# Patient Record
Sex: Female | Born: 1965
Health system: Southern US, Community
[De-identification: ages and names within clinical notes are randomized; demographics above are authoritative.]

## PROBLEM LIST (undated history)

## (undated) DIAGNOSIS — Z973 Presence of spectacles and contact lenses: Secondary | ICD-10-CM

## (undated) DIAGNOSIS — IMO0002 Reserved for concepts with insufficient information to code with codable children: Secondary | ICD-10-CM

## (undated) DIAGNOSIS — Z96659 Presence of unspecified artificial knee joint: Secondary | ICD-10-CM

## (undated) DIAGNOSIS — N814 Uterovaginal prolapse, unspecified: Secondary | ICD-10-CM

## (undated) DIAGNOSIS — S73004A Unspecified dislocation of right hip, initial encounter: Secondary | ICD-10-CM

## (undated) DIAGNOSIS — Q059 Spina bifida, unspecified: Secondary | ICD-10-CM

## (undated) HISTORY — DX: Uterovaginal prolapse, unspecified: N81.4

## (undated) HISTORY — DX: Spina bifida, unspecified: Q05.9

## (undated) HISTORY — DX: Reserved for concepts with insufficient information to code with codable children: IMO0002

## (undated) HISTORY — DX: Unspecified dislocation of right hip, initial encounter: S73.004A

## (undated) HISTORY — DX: Presence of spectacles and contact lenses: Z97.3

---
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
## 2003-07-12 DIAGNOSIS — Z96659 Presence of unspecified artificial knee joint: Secondary | ICD-10-CM

## 2003-07-12 HISTORY — DX: Presence of unspecified artificial knee joint: Z96.659

## 2011-07-12 DIAGNOSIS — IMO0002 Reserved for concepts with insufficient information to code with codable children: Secondary | ICD-10-CM

## 2011-07-12 DIAGNOSIS — R87619 Unspecified abnormal cytological findings in specimens from cervix uteri: Secondary | ICD-10-CM

## 2011-07-12 HISTORY — DX: Reserved for concepts with insufficient information to code with codable children: IMO0002

## 2011-07-12 HISTORY — DX: Unspecified abnormal cytological findings in specimens from cervix uteri: R87.619

## 2011-07-19 DIAGNOSIS — L02619 Cutaneous abscess of unspecified foot: Secondary | ICD-10-CM | POA: Diagnosis not present

## 2011-07-19 DIAGNOSIS — L97409 Non-pressure chronic ulcer of unspecified heel and midfoot with unspecified severity: Secondary | ICD-10-CM | POA: Diagnosis not present

## 2011-07-19 DIAGNOSIS — G609 Hereditary and idiopathic neuropathy, unspecified: Secondary | ICD-10-CM | POA: Diagnosis not present

## 2011-07-25 DIAGNOSIS — Z23 Encounter for immunization: Secondary | ICD-10-CM | POA: Diagnosis not present

## 2011-08-08 DIAGNOSIS — N3946 Mixed incontinence: Secondary | ICD-10-CM | POA: Diagnosis not present

## 2011-08-08 DIAGNOSIS — Q054 Unspecified spina bifida with hydrocephalus: Secondary | ICD-10-CM | POA: Diagnosis not present

## 2011-08-09 DIAGNOSIS — L97409 Non-pressure chronic ulcer of unspecified heel and midfoot with unspecified severity: Secondary | ICD-10-CM | POA: Diagnosis not present

## 2011-08-09 DIAGNOSIS — L03119 Cellulitis of unspecified part of limb: Secondary | ICD-10-CM | POA: Diagnosis not present

## 2011-08-09 DIAGNOSIS — L02619 Cutaneous abscess of unspecified foot: Secondary | ICD-10-CM | POA: Diagnosis not present

## 2011-08-09 DIAGNOSIS — G609 Hereditary and idiopathic neuropathy, unspecified: Secondary | ICD-10-CM | POA: Diagnosis not present

## 2011-08-19 DIAGNOSIS — M25569 Pain in unspecified knee: Secondary | ICD-10-CM | POA: Diagnosis not present

## 2011-08-25 DIAGNOSIS — H4920 Sixth [abducent] nerve palsy, unspecified eye: Secondary | ICD-10-CM | POA: Diagnosis not present

## 2011-08-25 DIAGNOSIS — H53459 Other localized visual field defect, unspecified eye: Secondary | ICD-10-CM | POA: Diagnosis not present

## 2011-08-25 DIAGNOSIS — H5210 Myopia, unspecified eye: Secondary | ICD-10-CM | POA: Diagnosis not present

## 2011-09-01 DIAGNOSIS — L97409 Non-pressure chronic ulcer of unspecified heel and midfoot with unspecified severity: Secondary | ICD-10-CM | POA: Diagnosis not present

## 2011-09-01 DIAGNOSIS — L02619 Cutaneous abscess of unspecified foot: Secondary | ICD-10-CM | POA: Diagnosis not present

## 2011-09-01 DIAGNOSIS — L03119 Cellulitis of unspecified part of limb: Secondary | ICD-10-CM | POA: Diagnosis not present

## 2011-09-01 DIAGNOSIS — G609 Hereditary and idiopathic neuropathy, unspecified: Secondary | ICD-10-CM | POA: Diagnosis not present

## 2011-09-27 DIAGNOSIS — L03119 Cellulitis of unspecified part of limb: Secondary | ICD-10-CM | POA: Diagnosis not present

## 2011-09-27 DIAGNOSIS — L02619 Cutaneous abscess of unspecified foot: Secondary | ICD-10-CM | POA: Diagnosis not present

## 2011-09-27 DIAGNOSIS — L97409 Non-pressure chronic ulcer of unspecified heel and midfoot with unspecified severity: Secondary | ICD-10-CM | POA: Diagnosis not present

## 2011-09-27 DIAGNOSIS — G609 Hereditary and idiopathic neuropathy, unspecified: Secondary | ICD-10-CM | POA: Diagnosis not present

## 2011-10-24 DIAGNOSIS — Q5270 Unspecified congenital malformations of vulva: Secondary | ICD-10-CM | POA: Diagnosis not present

## 2011-10-24 DIAGNOSIS — Q519 Congenital malformation of uterus and cervix, unspecified: Secondary | ICD-10-CM | POA: Diagnosis not present

## 2011-10-24 DIAGNOSIS — Z0000 Encounter for general adult medical examination without abnormal findings: Secondary | ICD-10-CM | POA: Diagnosis not present

## 2011-10-24 DIAGNOSIS — Q054 Unspecified spina bifida with hydrocephalus: Secondary | ICD-10-CM | POA: Diagnosis not present

## 2011-10-24 DIAGNOSIS — I721 Aneurysm of artery of upper extremity: Secondary | ICD-10-CM | POA: Diagnosis not present

## 2011-10-24 DIAGNOSIS — Z124 Encounter for screening for malignant neoplasm of cervix: Secondary | ICD-10-CM | POA: Diagnosis not present

## 2011-10-24 DIAGNOSIS — Z01419 Encounter for gynecological examination (general) (routine) without abnormal findings: Secondary | ICD-10-CM | POA: Diagnosis not present

## 2011-10-24 DIAGNOSIS — N3946 Mixed incontinence: Secondary | ICD-10-CM | POA: Diagnosis not present

## 2011-10-25 DIAGNOSIS — L97409 Non-pressure chronic ulcer of unspecified heel and midfoot with unspecified severity: Secondary | ICD-10-CM | POA: Diagnosis not present

## 2011-10-25 DIAGNOSIS — G609 Hereditary and idiopathic neuropathy, unspecified: Secondary | ICD-10-CM | POA: Diagnosis not present

## 2011-10-25 DIAGNOSIS — L03119 Cellulitis of unspecified part of limb: Secondary | ICD-10-CM | POA: Diagnosis not present

## 2011-10-25 DIAGNOSIS — L02619 Cutaneous abscess of unspecified foot: Secondary | ICD-10-CM | POA: Diagnosis not present

## 2011-11-07 ENCOUNTER — Other Ambulatory Visit: Payer: Self-pay | Admitting: Nurse Practitioner

## 2011-11-07 DIAGNOSIS — Q899 Congenital malformation, unspecified: Secondary | ICD-10-CM

## 2011-11-07 DIAGNOSIS — I721 Aneurysm of artery of upper extremity: Secondary | ICD-10-CM

## 2011-11-17 DIAGNOSIS — G911 Obstructive hydrocephalus: Secondary | ICD-10-CM | POA: Diagnosis not present

## 2011-11-17 DIAGNOSIS — G96198 Other disorders of meninges, not elsewhere classified: Secondary | ICD-10-CM | POA: Diagnosis not present

## 2011-11-17 DIAGNOSIS — R269 Unspecified abnormalities of gait and mobility: Secondary | ICD-10-CM | POA: Diagnosis not present

## 2011-11-17 DIAGNOSIS — IMO0002 Reserved for concepts with insufficient information to code with codable children: Secondary | ICD-10-CM | POA: Diagnosis not present

## 2011-11-24 ENCOUNTER — Ambulatory Visit
Admission: RE | Admit: 2011-11-24 | Discharge: 2011-11-24 | Disposition: A | Discharge status: Home / Self Care | Payer: Medicare Other | Source: Ambulatory Visit | Attending: Nurse Practitioner | Admitting: Nurse Practitioner

## 2011-11-24 ENCOUNTER — Other Ambulatory Visit: Payer: Self-pay | Admitting: Nurse Practitioner

## 2011-11-24 DIAGNOSIS — Q899 Congenital malformation, unspecified: Secondary | ICD-10-CM

## 2011-11-24 DIAGNOSIS — R9389 Abnormal findings on diagnostic imaging of other specified body structures: Secondary | ICD-10-CM | POA: Diagnosis not present

## 2011-11-24 DIAGNOSIS — Q5270 Unspecified congenital malformations of vulva: Secondary | ICD-10-CM | POA: Diagnosis not present

## 2011-11-24 DIAGNOSIS — D259 Leiomyoma of uterus, unspecified: Secondary | ICD-10-CM | POA: Diagnosis not present

## 2011-11-24 DIAGNOSIS — K824 Cholesterolosis of gallbladder: Secondary | ICD-10-CM | POA: Diagnosis not present

## 2011-11-24 DIAGNOSIS — N852 Hypertrophy of uterus: Secondary | ICD-10-CM | POA: Diagnosis not present

## 2011-11-24 DIAGNOSIS — Q524 Other congenital malformations of vagina: Secondary | ICD-10-CM | POA: Diagnosis not present

## 2011-11-24 DIAGNOSIS — I721 Aneurysm of artery of upper extremity: Secondary | ICD-10-CM

## 2011-11-24 DIAGNOSIS — R109 Unspecified abdominal pain: Secondary | ICD-10-CM | POA: Diagnosis not present

## 2011-11-24 DIAGNOSIS — Q519 Congenital malformation of uterus and cervix, unspecified: Secondary | ICD-10-CM | POA: Diagnosis not present

## 2011-11-25 ENCOUNTER — Encounter (HOSPITAL_BASED_OUTPATIENT_CLINIC_OR_DEPARTMENT_OTHER): Payer: Medicare Other | Attending: General Surgery

## 2011-11-25 DIAGNOSIS — L89899 Pressure ulcer of other site, unspecified stage: Secondary | ICD-10-CM | POA: Diagnosis not present

## 2011-11-25 DIAGNOSIS — L8992 Pressure ulcer of unspecified site, stage 2: Secondary | ICD-10-CM | POA: Insufficient documentation

## 2011-12-01 DIAGNOSIS — Z1231 Encounter for screening mammogram for malignant neoplasm of breast: Secondary | ICD-10-CM | POA: Diagnosis not present

## 2011-12-02 DIAGNOSIS — L89899 Pressure ulcer of other site, unspecified stage: Secondary | ICD-10-CM | POA: Diagnosis not present

## 2011-12-02 DIAGNOSIS — L8992 Pressure ulcer of unspecified site, stage 2: Secondary | ICD-10-CM | POA: Diagnosis not present

## 2011-12-09 DIAGNOSIS — L89899 Pressure ulcer of other site, unspecified stage: Secondary | ICD-10-CM | POA: Diagnosis not present

## 2011-12-09 DIAGNOSIS — L8992 Pressure ulcer of unspecified site, stage 2: Secondary | ICD-10-CM | POA: Diagnosis not present

## 2011-12-16 ENCOUNTER — Encounter (HOSPITAL_BASED_OUTPATIENT_CLINIC_OR_DEPARTMENT_OTHER): Payer: Medicare Other | Attending: General Surgery

## 2011-12-16 DIAGNOSIS — L89899 Pressure ulcer of other site, unspecified stage: Secondary | ICD-10-CM | POA: Diagnosis not present

## 2011-12-16 DIAGNOSIS — L84 Corns and callosities: Secondary | ICD-10-CM | POA: Diagnosis not present

## 2011-12-16 DIAGNOSIS — L8990 Pressure ulcer of unspecified site, unspecified stage: Secondary | ICD-10-CM | POA: Insufficient documentation

## 2011-12-16 DIAGNOSIS — G8220 Paraplegia, unspecified: Secondary | ICD-10-CM | POA: Diagnosis not present

## 2011-12-16 DIAGNOSIS — Z79899 Other long term (current) drug therapy: Secondary | ICD-10-CM | POA: Diagnosis not present

## 2011-12-16 DIAGNOSIS — Q059 Spina bifida, unspecified: Secondary | ICD-10-CM | POA: Insufficient documentation

## 2011-12-23 DIAGNOSIS — L84 Corns and callosities: Secondary | ICD-10-CM | POA: Diagnosis not present

## 2011-12-23 DIAGNOSIS — Q059 Spina bifida, unspecified: Secondary | ICD-10-CM | POA: Diagnosis not present

## 2011-12-23 DIAGNOSIS — L8990 Pressure ulcer of unspecified site, unspecified stage: Secondary | ICD-10-CM | POA: Diagnosis not present

## 2011-12-23 DIAGNOSIS — L89899 Pressure ulcer of other site, unspecified stage: Secondary | ICD-10-CM | POA: Diagnosis not present

## 2011-12-30 ENCOUNTER — Encounter (HOSPITAL_BASED_OUTPATIENT_CLINIC_OR_DEPARTMENT_OTHER): Payer: Medicare Other

## 2011-12-30 DIAGNOSIS — L89899 Pressure ulcer of other site, unspecified stage: Secondary | ICD-10-CM | POA: Diagnosis not present

## 2011-12-30 DIAGNOSIS — N319 Neuromuscular dysfunction of bladder, unspecified: Secondary | ICD-10-CM | POA: Diagnosis not present

## 2011-12-30 DIAGNOSIS — L84 Corns and callosities: Secondary | ICD-10-CM | POA: Diagnosis not present

## 2011-12-30 DIAGNOSIS — N3020 Other chronic cystitis without hematuria: Secondary | ICD-10-CM | POA: Diagnosis not present

## 2011-12-30 DIAGNOSIS — Q059 Spina bifida, unspecified: Secondary | ICD-10-CM | POA: Diagnosis not present

## 2011-12-30 DIAGNOSIS — N3946 Mixed incontinence: Secondary | ICD-10-CM | POA: Diagnosis not present

## 2011-12-30 DIAGNOSIS — L8990 Pressure ulcer of unspecified site, unspecified stage: Secondary | ICD-10-CM | POA: Diagnosis not present

## 2012-01-13 DIAGNOSIS — N3946 Mixed incontinence: Secondary | ICD-10-CM | POA: Diagnosis not present

## 2012-01-13 DIAGNOSIS — N3020 Other chronic cystitis without hematuria: Secondary | ICD-10-CM | POA: Diagnosis not present

## 2012-01-26 ENCOUNTER — Encounter: Payer: Self-pay | Admitting: Obstetrics and Gynecology

## 2012-02-13 DIAGNOSIS — N3020 Other chronic cystitis without hematuria: Secondary | ICD-10-CM | POA: Diagnosis not present

## 2012-02-13 DIAGNOSIS — N3941 Urge incontinence: Secondary | ICD-10-CM | POA: Diagnosis not present

## 2012-02-14 ENCOUNTER — Encounter: Payer: Self-pay | Admitting: Obstetrics and Gynecology

## 2012-02-14 ENCOUNTER — Ambulatory Visit (INDEPENDENT_AMBULATORY_CARE_PROVIDER_SITE_OTHER): Payer: Medicare Other | Admitting: Obstetrics and Gynecology

## 2012-02-14 VITALS — BP 100/60 | Ht <= 58 in | Wt 94.0 lb

## 2012-02-14 DIAGNOSIS — D219 Benign neoplasm of connective and other soft tissue, unspecified: Secondary | ICD-10-CM | POA: Insufficient documentation

## 2012-02-14 DIAGNOSIS — D259 Leiomyoma of uterus, unspecified: Secondary | ICD-10-CM

## 2012-02-14 DIAGNOSIS — Z719 Counseling, unspecified: Secondary | ICD-10-CM

## 2012-02-14 NOTE — Progress Notes (Signed)
Uterus: The uterus is enlarged measuring 15.7 cm sagittally with a  depth of 2.3 cm and width of 13.1 cm. There do appear to be at  least two uterine fibroids present. One fibroid measures 11.1 x  7.1 x 8.9 cm, with the second fibroid measuring 5.2 x 4.8 x 3.9 cm.  The presence of these large fibroids makes assessment of the  endometrium and adnexa difficult.  Endometrium: By the best measurement, the endometrium measures 15.8  mm in thickness although assessment is limited due to the large  fibroids. A small amount of fluid is noted in the lower uterine  segment.  Right ovary: The right ovary is not optimally seen but no mass is  evident.  Left ovary: The left ovary is normal in size with a small cyst of  1.6 cm present.  Other Findings: No free fluid is seen.  IMPRESSION:  1. Enlarged uterus with at least two measured fibroids as noted  above. These fibroids make assessment of the endometrium and  adnexa difficult.  2. No ovarian lesion is seen although the right ovary is not  optimally visualized.  Pt reports a nl pap within the last yr with PCP Pt reports constipation and difficulty with BMs-she has to push down a lot Pt reports hernia  No new complaints.  No menses except spotting intermittently over the past yr.  Filed Vitals:   02/14/12 1427  BP: 100/60   ROS: noncontributory  Pelvic exam:  VULVA: normal appearing vulva with no masses, tenderness or lesions,  VAGINA: normal appearing vagina with normal color and discharge, no lesions, prolapse to introitus of cervix CERVIX: normal appearing cervix without discharge or lesions,  UTERUS: uterus is18wk size, shape, consistency and nontender,  ADNEXA: normal adnexa in size, nontender and no masses.  A/P H/o spina bifida with bowel and urinary problems all her life which she and her mother say are no worse. Pt does not have cycles and is perimenopausal Uterine prolapse is significant but pt denies increased pelvic  pressure or problems because of it I offered pt TAH secondary to size but also discussed obs considering the lack of symptoms Pt and mom decide to obs for now RTO for AEX

## 2012-03-07 ENCOUNTER — Ambulatory Visit (INDEPENDENT_AMBULATORY_CARE_PROVIDER_SITE_OTHER): Payer: Medicare Other | Admitting: General Surgery

## 2012-03-07 ENCOUNTER — Encounter (INDEPENDENT_AMBULATORY_CARE_PROVIDER_SITE_OTHER): Payer: Self-pay | Admitting: General Surgery

## 2012-03-07 VITALS — BP 108/72 | HR 68 | Temp 99.0°F | Resp 18 | Ht <= 58 in | Wt 94.0 lb

## 2012-03-07 DIAGNOSIS — K439 Ventral hernia without obstruction or gangrene: Secondary | ICD-10-CM | POA: Diagnosis not present

## 2012-03-07 NOTE — Progress Notes (Signed)
Patient ID: Mary Neal, female   DOB: Jan 01, 1966, 46 y.o.   MRN: [id number]  No chief complaint on file.   HPI Mary Neal is a 46 y.o. female.  This patient is referred by Dr. Su Hilt for evaluation of a ventral hernia. She says that she has a history of an umbilical hernia repair performed almost 40 years ago as a child. She has not had any problems in that area since then but approximately one year ago she noticed a bulge in the midline of her upper abdomen. She presented to the emergency room last May for evaluation and she was discharged home after she was told that this was a hernia. She had some abdominal pain in the area at that time but this resolved and she has not had any symptoms since then. She denies any change in the size of the bulge and still has not had any symptoms since that visit last May. She has times where she struggles to move her bowels but she says that this is normal for her. She has no other obstructive symptoms such as nausea or vomiting. HPI  Past Medical History  Diagnosis Date  . Spina bifida   . Abnormal Pap smear 2013  . Wears glasses   . Uterine prolapse     No past surgical history on file.  Family History  Problem Relation Age of Onset  . Hypertension Mother     Social History History  Substance Use Topics  . Smoking status: Never Smoker   . Smokeless tobacco: Not on file  . Alcohol Use: No    Allergies  Allergen Reactions  . Latex   . Penicillins     Current Outpatient Prescriptions  Medication Sig Dispense Refill  . Cholecalciferol (VITAMIN D3) 2000 UNITS TABS Take by mouth.      . nitrofurantoin (MACRODANTIN) 50 MG capsule Take 50 mg by mouth 1 day or 1 dose.      . oxybutynin (DITROPAN XL) 15 MG 24 hr tablet Take 15 mg by mouth daily.      Marland Kitchen sulfamethoxazole-trimethoprim (BACTRIM DS) 800-160 MG per tablet         Review of Systems Review of Systems All other review of systems negative or noncontributory except as  stated in the HPI  Blood pressure 108/72, pulse 68, temperature 99 F (37.2 C), temperature source Temporal, resp. rate 18, height 4\' 8"  (1.422 m), weight 94 lb (42.638 kg).  Physical Exam Physical Exam  Vitals reviewed. Constitutional: She is oriented to person, place, and time. She appears well-developed and well-nourished. No distress.  HENT:  Head: Normocephalic and atraumatic.  Eyes: Conjunctivae are normal. Pupils are equal, round, and reactive to light.  Neck: Normal range of motion.  Cardiovascular: Normal rate and regular rhythm.   Pulmonary/Chest: Effort normal and breath sounds normal. No respiratory distress.  Abdominal: Soft. Bowel sounds are normal. She exhibits no distension. There is no tenderness. There is no rebound and no guarding.       Small reducible epigastric hernia, nontender   Musculoskeletal:       Walks with crutches and braces   Neurological: She is alert and oriented to person, place, and time.  Skin: Skin is warm and dry. She is not diaphoretic. No erythema.  Psychiatric: She has a normal mood and affect. Her behavior is normal. Judgment and thought content normal.    Data Reviewed   Assessment    Epigastric hernia-asymptomatic She does have a small epigastric  hernia without evidence of incarceration or strangulation. This is currently asymptomatic and has had only one episode of symptoms previously. I discussed with her the options for repair and explained that the textbook answer would be to repair this hernia surgically. However, she has no evidence of any symptoms and is not really interested in surgery at this time. I think that this is reasonable for this patient. We discussed the signs and symptoms of incarceration in strangulate and if she develops any symptoms or the bulge becomes nonreducible, then I recommended that she call the back and we can set up surgery at any time going forward. If any time in the future she desires repair, we will be  happy to set her up for epigastric hernia repair    Plan    For now we will plan on watchful waiting and if any symptoms develop or if she desires repair, we will be happy to set her up for epigastric hernia.       Lodema Pilot DAVID 03/07/2012, 4:32 PM

## 2012-03-19 DIAGNOSIS — B351 Tinea unguium: Secondary | ICD-10-CM | POA: Diagnosis not present

## 2012-03-19 DIAGNOSIS — M79609 Pain in unspecified limb: Secondary | ICD-10-CM | POA: Diagnosis not present

## 2012-05-11 DIAGNOSIS — R946 Abnormal results of thyroid function studies: Secondary | ICD-10-CM | POA: Diagnosis not present

## 2012-05-11 DIAGNOSIS — Z0289 Encounter for other administrative examinations: Secondary | ICD-10-CM | POA: Diagnosis not present

## 2012-05-11 DIAGNOSIS — Z0000 Encounter for general adult medical examination without abnormal findings: Secondary | ICD-10-CM | POA: Diagnosis not present

## 2012-05-11 DIAGNOSIS — Q519 Congenital malformation of uterus and cervix, unspecified: Secondary | ICD-10-CM | POA: Diagnosis not present

## 2012-05-11 DIAGNOSIS — R7989 Other specified abnormal findings of blood chemistry: Secondary | ICD-10-CM | POA: Diagnosis not present

## 2012-05-11 DIAGNOSIS — E559 Vitamin D deficiency, unspecified: Secondary | ICD-10-CM | POA: Diagnosis not present

## 2012-05-11 DIAGNOSIS — Z01419 Encounter for gynecological examination (general) (routine) without abnormal findings: Secondary | ICD-10-CM | POA: Diagnosis not present

## 2012-05-11 DIAGNOSIS — I721 Aneurysm of artery of upper extremity: Secondary | ICD-10-CM | POA: Diagnosis not present

## 2012-05-11 DIAGNOSIS — Q054 Unspecified spina bifida with hydrocephalus: Secondary | ICD-10-CM | POA: Diagnosis not present

## 2012-05-11 DIAGNOSIS — N3946 Mixed incontinence: Secondary | ICD-10-CM | POA: Diagnosis not present

## 2012-05-20 DIAGNOSIS — Z23 Encounter for immunization: Secondary | ICD-10-CM | POA: Diagnosis not present

## 2012-05-21 DIAGNOSIS — G96198 Other disorders of meninges, not elsewhere classified: Secondary | ICD-10-CM | POA: Diagnosis not present

## 2012-05-21 DIAGNOSIS — G911 Obstructive hydrocephalus: Secondary | ICD-10-CM | POA: Diagnosis not present

## 2012-05-21 DIAGNOSIS — R269 Unspecified abnormalities of gait and mobility: Secondary | ICD-10-CM | POA: Diagnosis not present

## 2012-06-11 DIAGNOSIS — M79609 Pain in unspecified limb: Secondary | ICD-10-CM | POA: Diagnosis not present

## 2012-06-11 DIAGNOSIS — B351 Tinea unguium: Secondary | ICD-10-CM | POA: Diagnosis not present

## 2012-07-23 DIAGNOSIS — Q054 Unspecified spina bifida with hydrocephalus: Secondary | ICD-10-CM | POA: Diagnosis not present

## 2012-07-23 DIAGNOSIS — N319 Neuromuscular dysfunction of bladder, unspecified: Secondary | ICD-10-CM | POA: Diagnosis not present

## 2012-07-23 DIAGNOSIS — K592 Neurogenic bowel, not elsewhere classified: Secondary | ICD-10-CM | POA: Diagnosis not present

## 2012-09-04 DIAGNOSIS — B351 Tinea unguium: Secondary | ICD-10-CM | POA: Diagnosis not present

## 2012-09-04 DIAGNOSIS — M79609 Pain in unspecified limb: Secondary | ICD-10-CM | POA: Diagnosis not present

## 2012-09-17 DIAGNOSIS — Z79899 Other long term (current) drug therapy: Secondary | ICD-10-CM | POA: Diagnosis not present

## 2012-09-17 DIAGNOSIS — N319 Neuromuscular dysfunction of bladder, unspecified: Secondary | ICD-10-CM | POA: Diagnosis not present

## 2012-09-17 DIAGNOSIS — Q054 Unspecified spina bifida with hydrocephalus: Secondary | ICD-10-CM | POA: Diagnosis not present

## 2012-09-17 DIAGNOSIS — K592 Neurogenic bowel, not elsewhere classified: Secondary | ICD-10-CM | POA: Diagnosis not present

## 2012-10-10 DIAGNOSIS — N319 Neuromuscular dysfunction of bladder, unspecified: Secondary | ICD-10-CM | POA: Diagnosis not present

## 2012-10-10 DIAGNOSIS — Q059 Spina bifida, unspecified: Secondary | ICD-10-CM | POA: Diagnosis not present

## 2012-10-24 DIAGNOSIS — M25569 Pain in unspecified knee: Secondary | ICD-10-CM | POA: Diagnosis not present

## 2012-10-24 DIAGNOSIS — M259 Joint disorder, unspecified: Secondary | ICD-10-CM | POA: Diagnosis not present

## 2012-10-24 DIAGNOSIS — Q054 Unspecified spina bifida with hydrocephalus: Secondary | ICD-10-CM | POA: Diagnosis not present

## 2012-10-29 DIAGNOSIS — M1710 Unilateral primary osteoarthritis, unspecified knee: Secondary | ICD-10-CM | POA: Diagnosis not present

## 2012-10-29 DIAGNOSIS — Q068 Other specified congenital malformations of spinal cord: Secondary | ICD-10-CM | POA: Diagnosis not present

## 2012-11-05 DIAGNOSIS — K592 Neurogenic bowel, not elsewhere classified: Secondary | ICD-10-CM | POA: Diagnosis not present

## 2012-11-05 DIAGNOSIS — M25569 Pain in unspecified knee: Secondary | ICD-10-CM | POA: Diagnosis not present

## 2012-11-05 DIAGNOSIS — Q059 Spina bifida, unspecified: Secondary | ICD-10-CM | POA: Diagnosis not present

## 2012-11-08 DIAGNOSIS — K592 Neurogenic bowel, not elsewhere classified: Secondary | ICD-10-CM | POA: Diagnosis not present

## 2012-11-08 DIAGNOSIS — N319 Neuromuscular dysfunction of bladder, unspecified: Secondary | ICD-10-CM | POA: Diagnosis not present

## 2012-11-08 DIAGNOSIS — Q057 Lumbar spina bifida without hydrocephalus: Secondary | ICD-10-CM | POA: Diagnosis not present

## 2012-11-08 DIAGNOSIS — M25569 Pain in unspecified knee: Secondary | ICD-10-CM | POA: Diagnosis not present

## 2012-11-13 DIAGNOSIS — S83116A Anterior dislocation of proximal end of tibia, unspecified knee, initial encounter: Secondary | ICD-10-CM | POA: Diagnosis not present

## 2012-11-13 DIAGNOSIS — M25569 Pain in unspecified knee: Secondary | ICD-10-CM | POA: Diagnosis not present

## 2012-11-13 DIAGNOSIS — G8929 Other chronic pain: Secondary | ICD-10-CM | POA: Diagnosis not present

## 2012-11-13 DIAGNOSIS — IMO0002 Reserved for concepts with insufficient information to code with codable children: Secondary | ICD-10-CM | POA: Diagnosis not present

## 2012-11-13 DIAGNOSIS — M1710 Unilateral primary osteoarthritis, unspecified knee: Secondary | ICD-10-CM | POA: Diagnosis not present

## 2012-11-16 DIAGNOSIS — H5500 Unspecified nystagmus: Secondary | ICD-10-CM | POA: Diagnosis not present

## 2012-11-16 DIAGNOSIS — H5000 Unspecified esotropia: Secondary | ICD-10-CM | POA: Diagnosis not present

## 2012-11-16 DIAGNOSIS — R51 Headache: Secondary | ICD-10-CM | POA: Diagnosis not present

## 2012-11-27 DIAGNOSIS — M79609 Pain in unspecified limb: Secondary | ICD-10-CM | POA: Diagnosis not present

## 2012-11-27 DIAGNOSIS — B351 Tinea unguium: Secondary | ICD-10-CM | POA: Diagnosis not present

## 2013-02-19 DIAGNOSIS — M79609 Pain in unspecified limb: Secondary | ICD-10-CM | POA: Diagnosis not present

## 2013-02-19 DIAGNOSIS — B351 Tinea unguium: Secondary | ICD-10-CM | POA: Diagnosis not present

## 2013-02-28 DIAGNOSIS — M259 Joint disorder, unspecified: Secondary | ICD-10-CM | POA: Diagnosis not present

## 2013-02-28 DIAGNOSIS — Q054 Unspecified spina bifida with hydrocephalus: Secondary | ICD-10-CM | POA: Diagnosis not present

## 2013-03-01 DIAGNOSIS — N319 Neuromuscular dysfunction of bladder, unspecified: Secondary | ICD-10-CM | POA: Diagnosis not present

## 2013-03-01 DIAGNOSIS — N3020 Other chronic cystitis without hematuria: Secondary | ICD-10-CM | POA: Diagnosis not present

## 2013-03-06 ENCOUNTER — Encounter: Payer: Self-pay | Admitting: Physical Medicine & Rehabilitation

## 2013-03-21 DIAGNOSIS — Z79899 Other long term (current) drug therapy: Secondary | ICD-10-CM | POA: Diagnosis not present

## 2013-03-21 DIAGNOSIS — M79609 Pain in unspecified limb: Secondary | ICD-10-CM | POA: Diagnosis not present

## 2013-03-21 DIAGNOSIS — Q054 Unspecified spina bifida with hydrocephalus: Secondary | ICD-10-CM | POA: Diagnosis not present

## 2013-03-21 DIAGNOSIS — R7309 Other abnormal glucose: Secondary | ICD-10-CM | POA: Diagnosis not present

## 2013-03-25 ENCOUNTER — Encounter: Payer: Self-pay | Admitting: Physical Medicine & Rehabilitation

## 2013-03-25 ENCOUNTER — Encounter: Payer: Medicare Other | Attending: Physical Medicine & Rehabilitation | Admitting: Physical Medicine & Rehabilitation

## 2013-03-25 VITALS — BP 140/72 | HR 80 | Resp 16 | Ht <= 58 in | Wt 98.0 lb

## 2013-03-25 DIAGNOSIS — M1710 Unilateral primary osteoarthritis, unspecified knee: Secondary | ICD-10-CM | POA: Insufficient documentation

## 2013-03-25 DIAGNOSIS — S8990XA Unspecified injury of unspecified lower leg, initial encounter: Secondary | ICD-10-CM | POA: Diagnosis not present

## 2013-03-25 DIAGNOSIS — M79609 Pain in unspecified limb: Secondary | ICD-10-CM | POA: Insufficient documentation

## 2013-03-25 DIAGNOSIS — S8992XA Unspecified injury of left lower leg, initial encounter: Secondary | ICD-10-CM | POA: Insufficient documentation

## 2013-03-25 DIAGNOSIS — R209 Unspecified disturbances of skin sensation: Secondary | ICD-10-CM | POA: Insufficient documentation

## 2013-03-25 DIAGNOSIS — M25569 Pain in unspecified knee: Secondary | ICD-10-CM | POA: Insufficient documentation

## 2013-03-25 DIAGNOSIS — G8220 Paraplegia, unspecified: Secondary | ICD-10-CM

## 2013-03-25 DIAGNOSIS — R29898 Other symptoms and signs involving the musculoskeletal system: Secondary | ICD-10-CM | POA: Insufficient documentation

## 2013-03-25 DIAGNOSIS — K592 Neurogenic bowel, not elsewhere classified: Secondary | ICD-10-CM | POA: Insufficient documentation

## 2013-03-25 DIAGNOSIS — Q059 Spina bifida, unspecified: Secondary | ICD-10-CM | POA: Diagnosis not present

## 2013-03-25 DIAGNOSIS — S83419A Sprain of medial collateral ligament of unspecified knee, initial encounter: Secondary | ICD-10-CM | POA: Insufficient documentation

## 2013-03-25 DIAGNOSIS — N319 Neuromuscular dysfunction of bladder, unspecified: Secondary | ICD-10-CM | POA: Diagnosis not present

## 2013-03-25 DIAGNOSIS — IMO0002 Reserved for concepts with insufficient information to code with codable children: Secondary | ICD-10-CM

## 2013-03-25 DIAGNOSIS — S83412S Sprain of medial collateral ligament of left knee, sequela: Secondary | ICD-10-CM

## 2013-03-25 MED ORDER — MELOXICAM 15 MG PO TABS: 15.0000 mg | ORAL_TABLET | Freq: Every day | ORAL | Status: DC

## 2013-03-25 MED ORDER — TRAMADOL HCL 50 MG PO TABS: 50.0000 mg | ORAL_TABLET | Freq: Three times a day (TID) | ORAL | Status: DC | PRN

## 2013-03-25 NOTE — Patient Instructions (Signed)
CALL ME WITH ANY PROBLEMS OR QUESTIONS (#[phone]).  HAVE A GOOD DAY  

## 2013-03-25 NOTE — Progress Notes (Signed)
Subjective:    Patient ID: Mary Neal, female    DOB: 1965-12-02, 47 y.o.   MRN: [id number]  HPI  This is an initial evaluation for Mary Neal, who has a hx of spina bifida, who comes in today with complaints of left thigh/knee pain. She has been wearing braces since she was a child. She has no feeling or movement in either of her feet. She has decreased sensation from mid calf up.   She wears bilateral KAFO's with a build up on the right shoe. The shoe will be replaced soon by [employer] here in [place]. She moved down to the [place] area just over a year ago from [place]   The pain is primarily at her left knee/thigh. Walking/sitting/standin don't necessarily make the pain worse. She tells me that she's seen ortho at [facility] who had discussed knee fusion. She's had numerous knee injections without substantial benefit.  She has used percocet for pain which worked for [employer]. Now it doesn't do much for her. She has been out of it for a week. Typically she would take one in the morning and one at night.   She caths herself daily. She's incontinent of stool. She has no sense of bowel or bladder.     Pain Inventory Average Pain 8 Pain Right Now 6 My pain is sharp  In the last 24 hours, has pain interfered with the following? General activity 1 Relation with others 1 Enjoyment of life 1 What TIME of day is your pain at its worst? anytime Sleep (in general) Fair  Pain is worse with: some activites Pain improves with: pacing activities Relief from Meds: 2  Mobility walk with assistance do you drive?  no needs help with transfers  Function disabled: date disabled . I need assistance with the following:  bathing and shopping  Neuro/Psych bladder control problems bowel control problems  Prior Studies Any changes since last visit?  no  Physicians involved in your care Any changes since last visit?  no   Family History  Problem Relation Age of Onset  .  Hypertension Mother    History   Social History  . Marital Status: Single    Spouse Name: N/A    Number of Children: N/A  . Years of Education: N/A   Social History Main Topics  . Smoking status: Never Smoker   . Smokeless tobacco: None  . Alcohol Use: No  . Drug Use: No  . Sexual Activity: No   Other Topics Concern  . None   Social History Narrative  . None   History reviewed. No pertinent past surgical history. Past Medical History  Diagnosis Date  . Spina bifida   . Abnormal Pap smear 2013  . Wears glasses   . Uterine prolapse    BP 140/72  Pulse 80  Resp 16  Ht 4\' 9"  (1.448 m)  Wt 98 lb (44.453 kg)  BMI 21.2 kg/m2  SpO2 98%     Review of Systems  Gastrointestinal: Positive for constipation.  Genitourinary: Positive for difficulty urinating.  Musculoskeletal: Positive for gait problem.  All other systems reviewed and are negative.       Objective:   Physical Exam  General: Alert and oriented x 3, No apparent distress HEENT: Head is normocephalic, atraumatic, PERRLA, EOMI, sclera anicteric, oral mucosa pink and moist, dentition intact, ext ear canals clear,  Neck: Supple without JVD or lymphadenopathy Heart: Reg rate and rhythm. No murmurs rubs or gallops Chest: CTA  bilaterally without wheezes, rales, or rhonchi; no distress Abdomen: Soft, non-tender, non-distended, bowel sounds positive. Extremities: No clubbing, cyanosis, or edema. Pulses are 2+ Skin: Clean and intact without signs of breakdown Neuro: absent sensation below the lower calf. Profound atrophy of all distal musculature. She has minimal to no hip extension or knee flexion. Cognitively she's generally appropriate, CN notable for dysconjugate gaze. Hearing seems in tact.  Musculoskeletal: Likely with complete MCL,ACL, PCL tears. Substantia internal rotation of the femur worsening with stance and flexion of the knee. Valgus defmormity is severe also. When she bears weight on the left the  whole left side collapses. She depends heavily on the KAFo's and her forearm crutches to support her weight. She sits on her pelvis to maintain extension of the back.  Psych: Pt's affect is appropriate. Pt is cooperative         Assessment & Plan:  1. Spina Bifida with profound distal lower extremity weakness, sensory loss, neurogenic bowel/bladder 2. Left knee pain with multiple ligament injuries and severe femoral inward rotation and valgus at knee    Plan: 1. Trial of meloxicam for pain relief, 15mg  qday 2. Trial of tramadol for breakthrough pain, 50mg  q8hr prn 3. Continue with voltaren gel but increase to TID to QID 4. Could benefit from some type of thigh strap to reduce femoral internal rotation and valgus at the knee. I reviewed her case with "Bobby" at [employer] and he understands what I'm looking for and will discuss with the patient when she returns for her new shoes in the next couple weeks.  5. I spent 45 minutes with the patient/mom. I will see her back in about a month.   I would like to thank Dr. Manson Passey for the opportunity to participate in Mrs.  Neal's care!

## 2013-03-29 ENCOUNTER — Telehealth: Payer: Self-pay

## 2013-03-29 NOTE — Telephone Encounter (Signed)
Patients mother called.  Did not leave details as to reason for call.

## 2013-03-29 NOTE — Telephone Encounter (Signed)
Patient's mother called and stated that the patient has recently been diagnosed by her PCP with RA. She wanted to know if you were still wanting to make adjustments to her braces?

## 2013-03-29 NOTE — Telephone Encounter (Signed)
Yes, most definitely! 

## 2013-03-29 NOTE — Telephone Encounter (Signed)
Informed patient's mother that Dr. Riley Kill definitely still wanted to adjust her braces. Patient already has an appt made for next week.

## 2013-04-22 ENCOUNTER — Encounter: Payer: Medicare Other | Attending: Physical Medicine & Rehabilitation | Admitting: Physical Medicine & Rehabilitation

## 2013-04-22 ENCOUNTER — Encounter: Payer: Self-pay | Admitting: Physical Medicine & Rehabilitation

## 2013-04-22 VITALS — BP 102/55 | HR 92 | Resp 14 | Ht <= 58 in | Wt 97.0 lb

## 2013-04-22 DIAGNOSIS — K592 Neurogenic bowel, not elsewhere classified: Secondary | ICD-10-CM | POA: Insufficient documentation

## 2013-04-22 DIAGNOSIS — R29898 Other symptoms and signs involving the musculoskeletal system: Secondary | ICD-10-CM | POA: Diagnosis not present

## 2013-04-22 DIAGNOSIS — M1710 Unilateral primary osteoarthritis, unspecified knee: Secondary | ICD-10-CM

## 2013-04-22 DIAGNOSIS — R209 Unspecified disturbances of skin sensation: Secondary | ICD-10-CM | POA: Diagnosis not present

## 2013-04-22 DIAGNOSIS — Q059 Spina bifida, unspecified: Secondary | ICD-10-CM | POA: Diagnosis not present

## 2013-04-22 DIAGNOSIS — M79609 Pain in unspecified limb: Secondary | ICD-10-CM | POA: Diagnosis not present

## 2013-04-22 DIAGNOSIS — IMO0002 Reserved for concepts with insufficient information to code with codable children: Secondary | ICD-10-CM | POA: Diagnosis not present

## 2013-04-22 DIAGNOSIS — N319 Neuromuscular dysfunction of bladder, unspecified: Secondary | ICD-10-CM | POA: Insufficient documentation

## 2013-04-22 DIAGNOSIS — S8992XA Unspecified injury of left lower leg, initial encounter: Secondary | ICD-10-CM

## 2013-04-22 DIAGNOSIS — S83412S Sprain of medial collateral ligament of left knee, sequela: Secondary | ICD-10-CM

## 2013-04-22 DIAGNOSIS — S8990XA Unspecified injury of unspecified lower leg, initial encounter: Secondary | ICD-10-CM

## 2013-04-22 DIAGNOSIS — G8220 Paraplegia, unspecified: Secondary | ICD-10-CM | POA: Diagnosis not present

## 2013-04-22 DIAGNOSIS — M25569 Pain in unspecified knee: Secondary | ICD-10-CM | POA: Diagnosis not present

## 2013-04-22 NOTE — Patient Instructions (Addendum)
MELOXICAM----ANTI-INFLAMMATORY---TAKE ONE TABLET DAILY WITH BREAKFAST (15MG )   TRAMADOL---PAIN MEDICATION----TAKE HALF TO ONE TABLET EVERY 8 HOURS AS NEEDED FOR BREAKTHROUGH PAIN   BE SENSIBLE ABOUT YOUR ACTIVITY LEVELS.

## 2013-04-22 NOTE — Progress Notes (Signed)
Subjective:    Patient ID: Mary Neal, female    DOB: August 07, 1965, 47 y.o.   MRN: [id number]  HPI  Mary Neal is back regarding her chronic left leg pain. We sent her to [facility] last month for some modifications to her left KAFO. She doesn't feel that they made a big difference. She took the meloxicam and ultram off and on, but not as I directed, and she's not sure if they are helping a great deal.  Her pcp apparently ordered rheum lab work and found a positive result, and now she's going to see a rheumatologist next month.   She continues to stay active around home, but sometimes goes a bit overboard. Her mother states that she does too much some days.     Pain Inventory Average Pain 6 Pain Right Now 7 My pain is sharp and stabbing  In the last 24 hours, has pain interfered with the following? General activity 6 Relation with others 5 Enjoyment of life 6 What TIME of day is your pain at its worst? varies Sleep (in general) Fair  Pain is worse with: walking and sitting Pain improves with: nothing really Relief from Meds: 0  Mobility walk with assistance how many minutes can you walk? walks with crutches and braces do you drive?  no transfers alone  Function disabled: date disabled . I need assistance with the following:  bathing, toileting, meal prep, household duties and shopping  Neuro/Psych bladder control problems bowel control problems  Prior Studies Any changes since last visit?  no  Physicians involved in your care Any changes since last visit?  no   Family History  Problem Relation Age of Onset  . Hypertension Mother    History   Social History  . Marital Status: Single    Spouse Name: N/A    Number of Children: N/A  . Years of Education: N/A   Social History Main Topics  . Smoking status: Never Smoker   . Smokeless tobacco: Never Used  . Alcohol Use: No  . Drug Use: No  . Sexual Activity: No   Other Topics Concern  . None   Social  History Narrative  . None   History reviewed. No pertinent past surgical history. Past Medical History  Diagnosis Date  . Spina bifida   . Abnormal Pap smear 2013  . Wears glasses   . Uterine prolapse    BP 102/55  Pulse 92  Resp 14  Ht 4\' 8"  (1.422 m)  Wt 97 lb (43.999 kg)  BMI 21.76 kg/m2  SpO2 97%     Review of Systems  Gastrointestinal:       Bowel Control Problems  Genitourinary:       Bladder control problems       Objective:   Physical Exam  General: Alert and oriented x 3, No apparent distress  HEENT: Head is normocephalic, atraumatic, PERRLA, EOMI, sclera anicteric, oral mucosa pink and moist, dentition intact, ext ear canals clear,  Neck: Supple without JVD or lymphadenopathy  Heart: Reg rate and rhythm. No murmurs rubs or gallops  Chest: CTA bilaterally without wheezes, rales, or rhonchi; no distress  Abdomen: Soft, non-tender, non-distended, bowel sounds positive.  Extremities: No clubbing, cyanosis, or edema. Pulses are 2+  Skin: Clean and intact without signs of breakdown  Neuro: absent sensation below the lower calf. Profound atrophy of all distal musculature. She has minimal to no hip extension or knee flexion. Cognitively she's generally appropriate, CN notable for dysconjugate gaze.  Hearing seems in tact.  Musculoskeletal: Likely with complete MCL,ACL, PCL tears. Substantia internal rotation of the femur worsening with stance and flexion of the knee. Valgus defmormity is severe also. When she bears weight on the left the whole left side collapses. To clear the left leg in swing she goes from excessive ER to IR to in effect swing the leg forward. The adjustments to the brace do seem to be providing more support and controlling valgus somewhat, but the valgus is still severe.  She sits on her pelvis to maintain extension of the back.  Psych: Pt's affect is appropriate. Pt is cooperative    Assessment & Plan:   1. Spina Bifida with profound distal lower  extremity weakness, sensory loss, neurogenic bowel/bladder  2. Left knee pain with multiple ligament injuries and severe femoral inward rotation and valgus at knee   Plan:  1. Continue meloxicam for pain relief, 15mg  qday---reviewed in detail, how I want her to take this.   2. Reviewed the use of tramadol for breakthrough pain, 50mg  q8hr prn  3. Continue with voltaren gel but increase to TID to QID  4.Rheum eval per PCP. Await their findings. Not sure how much this has a clinical impact given her severe mechanical flaws and substitutional  patterns 5. I spent 30 minutes with the patient/mom. I will see her back in about 2 months.

## 2013-05-06 DIAGNOSIS — K592 Neurogenic bowel, not elsewhere classified: Secondary | ICD-10-CM | POA: Diagnosis not present

## 2013-05-06 DIAGNOSIS — N319 Neuromuscular dysfunction of bladder, unspecified: Secondary | ICD-10-CM | POA: Diagnosis not present

## 2013-05-06 DIAGNOSIS — Q059 Spina bifida, unspecified: Secondary | ICD-10-CM | POA: Diagnosis not present

## 2013-05-13 DIAGNOSIS — Z1231 Encounter for screening mammogram for malignant neoplasm of breast: Secondary | ICD-10-CM | POA: Diagnosis not present

## 2013-05-14 DIAGNOSIS — M79609 Pain in unspecified limb: Secondary | ICD-10-CM | POA: Diagnosis not present

## 2013-05-14 DIAGNOSIS — B351 Tinea unguium: Secondary | ICD-10-CM | POA: Diagnosis not present

## 2013-05-16 DIAGNOSIS — M2550 Pain in unspecified joint: Secondary | ICD-10-CM | POA: Diagnosis not present

## 2013-05-16 DIAGNOSIS — M25519 Pain in unspecified shoulder: Secondary | ICD-10-CM | POA: Diagnosis not present

## 2013-05-16 DIAGNOSIS — M25569 Pain in unspecified knee: Secondary | ICD-10-CM | POA: Diagnosis not present

## 2013-05-16 DIAGNOSIS — R6889 Other general symptoms and signs: Secondary | ICD-10-CM | POA: Diagnosis not present

## 2013-06-16 DIAGNOSIS — Z23 Encounter for immunization: Secondary | ICD-10-CM | POA: Diagnosis not present

## 2013-06-17 ENCOUNTER — Encounter: Payer: Self-pay | Admitting: Physical Medicine & Rehabilitation

## 2013-06-17 ENCOUNTER — Encounter: Payer: Medicare Other | Attending: Physical Medicine & Rehabilitation | Admitting: Physical Medicine & Rehabilitation

## 2013-06-17 VITALS — BP 159/78 | HR 92 | Resp 14 | Ht <= 58 in | Wt 97.8 lb

## 2013-06-17 DIAGNOSIS — IMO0002 Reserved for concepts with insufficient information to code with codable children: Secondary | ICD-10-CM | POA: Diagnosis not present

## 2013-06-17 DIAGNOSIS — M25569 Pain in unspecified knee: Secondary | ICD-10-CM | POA: Diagnosis not present

## 2013-06-17 DIAGNOSIS — M1712 Unilateral primary osteoarthritis, left knee: Secondary | ICD-10-CM | POA: Insufficient documentation

## 2013-06-17 DIAGNOSIS — M1710 Unilateral primary osteoarthritis, unspecified knee: Secondary | ICD-10-CM

## 2013-06-17 DIAGNOSIS — Q059 Spina bifida, unspecified: Secondary | ICD-10-CM | POA: Diagnosis not present

## 2013-06-17 DIAGNOSIS — S8990XA Unspecified injury of unspecified lower leg, initial encounter: Secondary | ICD-10-CM

## 2013-06-17 DIAGNOSIS — R29898 Other symptoms and signs involving the musculoskeletal system: Secondary | ICD-10-CM | POA: Diagnosis not present

## 2013-06-17 DIAGNOSIS — R209 Unspecified disturbances of skin sensation: Secondary | ICD-10-CM | POA: Diagnosis not present

## 2013-06-17 DIAGNOSIS — K592 Neurogenic bowel, not elsewhere classified: Secondary | ICD-10-CM | POA: Insufficient documentation

## 2013-06-17 DIAGNOSIS — S8992XA Unspecified injury of left lower leg, initial encounter: Secondary | ICD-10-CM

## 2013-06-17 DIAGNOSIS — N319 Neuromuscular dysfunction of bladder, unspecified: Secondary | ICD-10-CM | POA: Diagnosis not present

## 2013-06-17 DIAGNOSIS — M79609 Pain in unspecified limb: Secondary | ICD-10-CM | POA: Insufficient documentation

## 2013-06-17 DIAGNOSIS — G8220 Paraplegia, unspecified: Secondary | ICD-10-CM

## 2013-06-17 NOTE — Progress Notes (Signed)
Subjective:    Patient ID: Mary Neal, female    DOB: 06/14/1966, 47 y.o.   MRN: [id number]  HPI  [name] is back regarding her chronic left knee pain. She feels that the combination of the tramadol and meloxicam/voltaren gel has been helpful for her knee. She still has more pain when she's up for a long period of time.   She saw the rheumatologist who really didn't have much to offer her.    Pain Inventory Average Pain 6 Pain Right Now 6 My pain is stabbing  In the last 24 hours, has pain interfered with the following? General activity 0 Relation with others 0 Enjoyment of life 0 What TIME of day is your pain at its worst? anytime Sleep (in general) Good  Pain is worse with: walking and sitting Pain improves with: rest and medication Relief from Meds: 4  Mobility walk with assistance ability to climb steps?  yes do you drive?  no  Function disabled: date disabled na I need assistance with the following:  bathing, meal prep, household duties and shopping  Neuro/Psych bladder control problems bowel control problems  Prior Studies Any changes since last visit?  no  Physicians involved in your care Any changes since last visit?  no   Family History  Problem Relation Age of Onset  . Hypertension Mother    History   Social History  . Marital Status: Single    Spouse Name: N/A    Number of Children: N/A  . Years of Education: N/A   Social History Main Topics  . Smoking status: Never Smoker   . Smokeless tobacco: Never Used  . Alcohol Use: No  . Drug Use: No  . Sexual Activity: No   Other Topics Concern  . None   Social History Narrative  . None   History reviewed. No pertinent past surgical history. Past Medical History  Diagnosis Date  . Spina bifida   . Abnormal Pap smear 2013  . Wears glasses   . Uterine prolapse    BP 159/78  Pulse 92  Resp 14  Ht 4\' 8"  (1.422 m)  Wt 97 lb 12.8 oz (44.362 kg)  BMI 21.94 kg/m2  SpO2  100%      Review of Systems  Genitourinary:       Bowel and bladder control problems  All other systems reviewed and are negative.       Objective:   Physical Exam  General: Alert and oriented x 3, No apparent distress  HEENT: Head is normocephalic, atraumatic, PERRLA, EOMI, sclera anicteric, oral mucosa pink and moist, dentition intact, ext ear canals clear,  Neck: Supple without JVD or lymphadenopathy  Heart: Reg rate and rhythm. No murmurs rubs or gallops  Chest: CTA bilaterally without wheezes, rales, or rhonchi; no distress  Abdomen: Soft, non-tender, non-distended, bowel sounds positive.  Extremities: No clubbing, cyanosis, or edema. Pulses are 2+  Skin: Clean and intact without signs of breakdown  Neuro: absent sensation below the lower calf. Profound atrophy of all distal musculature. She has minimal to no hip extension or knee flexion. Cognitively she's generally appropriate, CN notable for dysconjugate gaze. Hearing seems intact.  Musculoskeletal: Likely with complete MCL,ACL, PCL tears. Substantia internal rotation of the femur worsening with stance and flexion of the knee. Valgus defmormity remains severe also. When she bears weight on the left the whole left side collapses.  . The adjustments to the brace do seem to be providing more support and  controlling valgus somewhat, but the valgus is still severe. She sits on her pelvis to maintain extension of the back.  Psych: Pt's affect is appropriate. Pt is cooperative    Assessment & Plan:  1. Spina Bifida with profound distal lower extremity weakness, sensory loss, neurogenic bowel/bladder  2. Left knee pain/ OA  with multiple ligament injuries and severe femoral inward rotation and valgus at knee    Plan:  1. Continue meloxicam for pain relief, 15mg  qday prn. She should use this OR the voltaren gel on most days, not both. 2. Continue tramadol for breakthrough pain. This seems to have helped to take the edge off of her  pain.  3. Continue with voltaren gel but increase to TID to QID  4. Reviewed adjustments in posture. It would be helpful if she can try to bring her left foot in closer to her midline with some ER to help decrease the valgus at her knee.  5. I spent 30 minutes with the patient/mom. I will see her back in about 3 months.

## 2013-06-17 NOTE — Patient Instructions (Signed)
PLEASE USE THE MELOXICAM OR THE VOLTAREN GEL FOR PAIN. DON'T USE THEM TOGETHER UNLESS YOU ARE GOING TO BE REALLY ACTIVE ON A GIVEN DAY.     CALL ME WITH ANY PROBLEMS OR QUESTIONS (#[phone]).    HAPPY HOLIDAYS!!!!!

## 2013-08-06 DIAGNOSIS — B351 Tinea unguium: Secondary | ICD-10-CM | POA: Diagnosis not present

## 2013-08-06 DIAGNOSIS — M79609 Pain in unspecified limb: Secondary | ICD-10-CM | POA: Diagnosis not present

## 2013-09-16 ENCOUNTER — Encounter: Payer: Self-pay | Admitting: Physical Medicine & Rehabilitation

## 2013-09-16 ENCOUNTER — Encounter: Payer: Medicare Other | Attending: Physical Medicine & Rehabilitation | Admitting: Physical Medicine & Rehabilitation

## 2013-09-16 VITALS — BP 135/66 | HR 90 | Resp 14 | Ht <= 58 in | Wt 98.0 lb

## 2013-09-16 DIAGNOSIS — X58XXXA Exposure to other specified factors, initial encounter: Secondary | ICD-10-CM | POA: Insufficient documentation

## 2013-09-16 DIAGNOSIS — S8990XA Unspecified injury of unspecified lower leg, initial encounter: Secondary | ICD-10-CM

## 2013-09-16 DIAGNOSIS — M25569 Pain in unspecified knee: Secondary | ICD-10-CM | POA: Diagnosis not present

## 2013-09-16 DIAGNOSIS — M1710 Unilateral primary osteoarthritis, unspecified knee: Secondary | ICD-10-CM | POA: Diagnosis not present

## 2013-09-16 DIAGNOSIS — IMO0002 Reserved for concepts with insufficient information to code with codable children: Secondary | ICD-10-CM | POA: Diagnosis not present

## 2013-09-16 DIAGNOSIS — R209 Unspecified disturbances of skin sensation: Secondary | ICD-10-CM | POA: Insufficient documentation

## 2013-09-16 DIAGNOSIS — R29898 Other symptoms and signs involving the musculoskeletal system: Secondary | ICD-10-CM | POA: Diagnosis not present

## 2013-09-16 DIAGNOSIS — N319 Neuromuscular dysfunction of bladder, unspecified: Secondary | ICD-10-CM | POA: Diagnosis not present

## 2013-09-16 DIAGNOSIS — S99929A Unspecified injury of unspecified foot, initial encounter: Secondary | ICD-10-CM

## 2013-09-16 DIAGNOSIS — M1712 Unilateral primary osteoarthritis, left knee: Secondary | ICD-10-CM

## 2013-09-16 DIAGNOSIS — Q059 Spina bifida, unspecified: Secondary | ICD-10-CM | POA: Diagnosis not present

## 2013-09-16 DIAGNOSIS — G8220 Paraplegia, unspecified: Secondary | ICD-10-CM | POA: Diagnosis not present

## 2013-09-16 DIAGNOSIS — K592 Neurogenic bowel, not elsewhere classified: Secondary | ICD-10-CM | POA: Insufficient documentation

## 2013-09-16 DIAGNOSIS — M179 Osteoarthritis of knee, unspecified: Secondary | ICD-10-CM

## 2013-09-16 DIAGNOSIS — S8992XA Unspecified injury of left lower leg, initial encounter: Secondary | ICD-10-CM

## 2013-09-16 DIAGNOSIS — S99919A Unspecified injury of unspecified ankle, initial encounter: Secondary | ICD-10-CM

## 2013-09-16 DIAGNOSIS — G8929 Other chronic pain: Secondary | ICD-10-CM | POA: Insufficient documentation

## 2013-09-16 NOTE — Patient Instructions (Addendum)
PLEASE CALL ME WITH ANY PROBLEMS OR QUESTIONS (#[phone]).     ICE, RELATIVE REST, VOLTAREN GEL WHEN KNEE FLARES UP.  WORK ON CONSERVING YOURSELF TO HELP WEARING DOWN YOUR JOINTS FURTHER. I WANT YOU TO STAY ACTIVE, BUT DON'T OVER DO IT!!!!

## 2013-09-16 NOTE — Progress Notes (Signed)
Subjective:    Patient ID: Mary Neal, female    DOB: Jun 04, 1966, 48 y.o.   MRN: [id number]  HPI  Mary Neal is back regarding her chronic pain. She has been feeling well until this weekend when her knees flared up. She really is not using the tramadol any more. She uses the voltaren gel mostly. She is occasionally using the meloxicam.   The knee seems to be bothering her right over the patella. She continues with her leg braces. Her mom states that she often walks "when she doesn't need to." [name] just wants to stay active.  Pain Inventory Average Pain 10 Pain Right Now 10 My pain is stabbing  In the last 24 hours, has pain interfered with the following? General activity 0 Relation with others 0 Enjoyment of life 0 What TIME of day is your pain at its worst? varies Sleep (in general) Good  Pain is worse with: unsure Pain improves with: nothing Relief from Meds: no pain meds  Mobility walk with assistance ability to climb steps?  yes do you drive?  no  Function I need assistance with the following:  meal prep and household duties  Neuro/Psych bladder control problems bowel control problems  Prior Studies Any changes since last visit?  no  Physicians involved in your care Any changes since last visit?  no   Family History  Problem Relation Age of Onset  . Hypertension Mother    History   Social History  . Marital Status: Single    Spouse Name: N/A    Number of Children: N/A  . Years of Education: N/A   Social History Main Topics  . Smoking status: Never Smoker   . Smokeless tobacco: Never Used  . Alcohol Use: No  . Drug Use: No  . Sexual Activity: No   Other Topics Concern  . None   Social History Narrative  . None   History reviewed. No pertinent past surgical history. Past Medical History  Diagnosis Date  . Spina bifida   . Abnormal Pap smear 2013  . Wears glasses   . Uterine prolapse    BP 135/66  Pulse 90  Resp 14  Ht 4\' 6"   (1.372 m)  Wt 98 lb (44.453 kg)  BMI 23.62 kg/m2  SpO2 98%  Opioid Risk Score:   Fall Risk Score: High Fall Risk (>13 points) (pt educated on fall risk, pt received brochure previously)   Review of Systems  Genitourinary:       Bowel and bladder control problems  All other systems reviewed and are negative.       Objective:   Physical Exam  General: Alert and oriented x 3, No apparent distress  HEENT: Head is normocephalic, atraumatic, PERRLA, EOMI, sclera anicteric, oral mucosa pink and moist, dentition intact, ext ear canals clear,  Neck: Supple without JVD or lymphadenopathy  Heart: Reg rate and rhythm. No murmurs rubs or gallops  Chest: CTA bilaterally without wheezes, rales, or rhonchi; no distress  Abdomen: Soft, non-tender, non-distended, bowel sounds positive.  Extremities: No clubbing, cyanosis, or edema. Pulses are 2+  Skin: Clean and intact without signs of breakdown  Neuro: absent sensation below the lower calf. Profound atrophy of all distal musculature. She has minimal to no hip extension or knee flexion. Cognitively she's generally appropriate, CN notable for dysconjugate gaze. Hearing seems intact.  Musculoskeletal: Likely with complete MCL,ACL, PCL tears. SubstantiaL internal rotation of the femur worsening with stance and flexion of the knee. Valgus  defmormity remains severe also. . The adjustments to the brace do seem to be providing more support and controlling valgus somewhat, but the valgus is still severe. She sits on her pelvis to maintain extension of the back.  Psych: Pt's affect is appropriate. Pt is cooperative    Assessment & Plan:  1. Spina Bifida with profound distal lower extremity weakness, sensory loss, neurogenic bowel/bladder  2. Left knee pain/ OA with multiple ligament injuries and severe femoral inward rotation and valgus at knee   Plan:  1. Continue meloxicam for pain relief, 15mg  qday prn. She should use this OR the voltaren gel on most  days, not both.  2. Continue tramadol PRN for severe pain. Encouraged use of ice/rest/voltaren gel for flare ups as well.  3. Continue with voltaren gel but increase to TID to QID  4.  Made a referral to outpt PT to help fit her with an appropriate, light weight wheelchair that she can use around the house occasionally and for when she leaves the house.  5. I spent 30 minutes again with the patient/mom. I will see her back in about 3 months.

## 2013-10-01 ENCOUNTER — Telehealth: Payer: Self-pay

## 2013-10-01 NOTE — Telephone Encounter (Signed)
Patient mother called regarding the appointment for her wheelchair evaluation.

## 2013-10-02 NOTE — Telephone Encounter (Signed)
Contacted patient regarding referral for a wheelchair. Patient's mother states she has not been contacted by PT in regards to a wheelchair evaluation yet. Referral was made on 09/16/13. Contacted Neuro and Neuro is going to contact patient to make a appt.

## 2013-10-29 DIAGNOSIS — M79609 Pain in unspecified limb: Secondary | ICD-10-CM | POA: Diagnosis not present

## 2013-10-29 DIAGNOSIS — B351 Tinea unguium: Secondary | ICD-10-CM | POA: Diagnosis not present

## 2013-11-05 DIAGNOSIS — E785 Hyperlipidemia, unspecified: Secondary | ICD-10-CM | POA: Diagnosis not present

## 2013-11-05 DIAGNOSIS — N319 Neuromuscular dysfunction of bladder, unspecified: Secondary | ICD-10-CM | POA: Diagnosis not present

## 2013-11-05 DIAGNOSIS — R5383 Other fatigue: Secondary | ICD-10-CM | POA: Diagnosis not present

## 2013-11-05 DIAGNOSIS — Q054 Unspecified spina bifida with hydrocephalus: Secondary | ICD-10-CM | POA: Diagnosis not present

## 2013-11-05 DIAGNOSIS — R5381 Other malaise: Secondary | ICD-10-CM | POA: Diagnosis not present

## 2013-11-05 DIAGNOSIS — D518 Other vitamin B12 deficiency anemias: Secondary | ICD-10-CM | POA: Diagnosis not present

## 2013-11-05 DIAGNOSIS — E559 Vitamin D deficiency, unspecified: Secondary | ICD-10-CM | POA: Diagnosis not present

## 2013-11-05 DIAGNOSIS — Z0000 Encounter for general adult medical examination without abnormal findings: Secondary | ICD-10-CM | POA: Diagnosis not present

## 2013-11-05 DIAGNOSIS — R7309 Other abnormal glucose: Secondary | ICD-10-CM | POA: Diagnosis not present

## 2013-11-05 DIAGNOSIS — N3946 Mixed incontinence: Secondary | ICD-10-CM | POA: Diagnosis not present

## 2013-11-14 ENCOUNTER — Ambulatory Visit: Payer: Medicare Other | Attending: Physical Medicine & Rehabilitation | Admitting: Physical Therapy

## 2013-11-14 DIAGNOSIS — R262 Difficulty in walking, not elsewhere classified: Secondary | ICD-10-CM | POA: Insufficient documentation

## 2013-11-14 DIAGNOSIS — Q059 Spina bifida, unspecified: Secondary | ICD-10-CM | POA: Insufficient documentation

## 2013-11-14 DIAGNOSIS — IMO0001 Reserved for inherently not codable concepts without codable children: Secondary | ICD-10-CM | POA: Diagnosis not present

## 2013-12-16 ENCOUNTER — Encounter: Payer: Medicare Other | Attending: Physical Medicine & Rehabilitation | Admitting: Physical Medicine & Rehabilitation

## 2013-12-16 ENCOUNTER — Encounter: Payer: Self-pay | Admitting: Physical Medicine & Rehabilitation

## 2013-12-16 VITALS — Wt 96.0 lb

## 2013-12-16 DIAGNOSIS — X58XXXA Exposure to other specified factors, initial encounter: Secondary | ICD-10-CM | POA: Diagnosis not present

## 2013-12-16 DIAGNOSIS — IMO0002 Reserved for concepts with insufficient information to code with codable children: Secondary | ICD-10-CM | POA: Insufficient documentation

## 2013-12-16 DIAGNOSIS — G8220 Paraplegia, unspecified: Secondary | ICD-10-CM | POA: Diagnosis not present

## 2013-12-16 DIAGNOSIS — M1710 Unilateral primary osteoarthritis, unspecified knee: Secondary | ICD-10-CM | POA: Diagnosis not present

## 2013-12-16 DIAGNOSIS — G8929 Other chronic pain: Secondary | ICD-10-CM | POA: Diagnosis not present

## 2013-12-16 DIAGNOSIS — R29898 Other symptoms and signs involving the musculoskeletal system: Secondary | ICD-10-CM | POA: Insufficient documentation

## 2013-12-16 DIAGNOSIS — N319 Neuromuscular dysfunction of bladder, unspecified: Secondary | ICD-10-CM | POA: Insufficient documentation

## 2013-12-16 DIAGNOSIS — Q059 Spina bifida, unspecified: Secondary | ICD-10-CM | POA: Insufficient documentation

## 2013-12-16 DIAGNOSIS — K592 Neurogenic bowel, not elsewhere classified: Secondary | ICD-10-CM | POA: Diagnosis not present

## 2013-12-16 DIAGNOSIS — M1712 Unilateral primary osteoarthritis, left knee: Secondary | ICD-10-CM

## 2013-12-16 DIAGNOSIS — M25569 Pain in unspecified knee: Secondary | ICD-10-CM | POA: Diagnosis not present

## 2013-12-16 DIAGNOSIS — R209 Unspecified disturbances of skin sensation: Secondary | ICD-10-CM | POA: Insufficient documentation

## 2013-12-16 NOTE — Patient Instructions (Signed)
USE YOUR WHEELCHAIR TO HELP TAKE SOME OF THE LOAD OFF OF YOUR KNEES/FEET (ESPECIALLY FOR LONGER DISTANCES).

## 2013-12-16 NOTE — Progress Notes (Signed)
Subjective:    Patient ID: Mary Neal, female    DOB: July 28, 1965, 48 y.o.   MRN: [id number]  HPI  Mary Neal is here today, primarily for a mobility/ wheelchair evaluation. She has gone through the process with outpt therapy and vendor and needs this evaluation today to complete the process.  She remains limited by her lower extremity weakness and degenerative knee pain which are worsened with ambulation. She is still using bilateral forearm crutches and KAFO's to stabilize her knees.    Pain Inventory Average Pain 10 Pain Right Now 10 My pain is stabbing  In the last 24 hours, has pain interfered with the following? General activity 0 Relation with others 0 Enjoyment of life 0 What TIME of day is your pain at its worst? varies Sleep (in general) Good  Pain is worse with: unsure Pain improves with: nothing Relief from Meds: no pain meds  Mobility walk with assistance ability to climb steps?  yes do you drive?  no  Function I need assistance with the following:  meal prep and household duties  Neuro/Psych bladder control problems bowel control problems  Prior Studies Any changes since last visit?  no  Physicians involved in your care Any changes since last visit?  no   Family History  Problem Relation Age of Onset  . Hypertension Mother    History   Social History  . Marital Status: Single    Spouse Name: N/A    Number of Children: N/A  . Years of Education: N/A   Social History Main Topics  . Smoking status: Never Smoker   . Smokeless tobacco: Never Used  . Alcohol Use: No  . Drug Use: No  . Sexual Activity: No   Other Topics Concern  . None   Social History Narrative  . None   History reviewed. No pertinent past surgical history. Past Medical History  Diagnosis Date  . Spina bifida   . Abnormal Pap smear 2013  . Wears glasses   . Uterine prolapse    Wt 96 lb (43.545 kg)  Opioid Risk Score:   Fall Risk Score: High Fall Risk (>13  points) (educated and handout given for fall prevention in the home at previous visit)   Review of Systems  Genitourinary:       Bowel and bladder control problems  All other systems reviewed and are negative.      Objective:   Physical Exam  General: Alert and oriented x 3, No apparent distress  HEENT: Head is normocephalic, atraumatic, PERRLA, EOMI, sclera anicteric, oral mucosa pink and moist, dentition intact, ext ear canals clear,  Neck: Supple without JVD or lymphadenopathy  Heart: Reg rate and rhythm. No murmurs rubs or gallops  Chest: CTA bilaterally without wheezes, rales, or rhonchi; no distress  Abdomen: Soft, non-tender, non-distended, bowel sounds positive.  Extremities: No clubbing, cyanosis, or edema. Pulses are 2+  Skin: Clean and intact without signs of breakdown  Neuro: absent sensation below the lower calf. Profound atrophy of all distal musculature. She has minimal to no hip extension or knee flexion. Cognitively she's generally appropriate, CN notable for dysconjugate gaze. Hearing seems intact.  Musculoskeletal: Likely with complete MCL,ACL, PCL tears. continued internal rotation of the femur worsening with stance and flexion of the knee. Valgus defmormity remains severe also. . T  She sits on her pelvis to maintain extension of the back.  Psych: Pt's affect is appropriate. Pt is cooperative    Assessment & Plan:  1. Spina Bifida with profound distal lower extremity weakness, sensory loss, neurogenic bowel/bladder  2. Left knee pain/ OA with multiple ligament injuries and severe femoral inward rotation and valgus at knee   Plan:  1. I have reviewed and concur with the extensive PT wheelchair evaluation.  2 Mary Neal requires an ultra-light-weight wheelchair because she is unable to propel a regular, manual wheelchair on her own. She needs the ultra-light-weight wheelchair so that she can move more safely within her home and so that she can access ramps in the  community. Mary Neal is motivated to use such a chair so that she can be more independent as a whole.    Otherwise, Mary Neal is doing well. I have nothing else new to offer today. I will see her back on a PRN basis.

## 2013-12-25 DIAGNOSIS — N319 Neuromuscular dysfunction of bladder, unspecified: Secondary | ICD-10-CM | POA: Diagnosis not present

## 2013-12-25 DIAGNOSIS — Z79899 Other long term (current) drug therapy: Secondary | ICD-10-CM | POA: Diagnosis not present

## 2014-01-13 DIAGNOSIS — K592 Neurogenic bowel, not elsewhere classified: Secondary | ICD-10-CM | POA: Diagnosis not present

## 2014-01-13 DIAGNOSIS — N319 Neuromuscular dysfunction of bladder, unspecified: Secondary | ICD-10-CM | POA: Diagnosis not present

## 2014-01-13 DIAGNOSIS — Q059 Spina bifida, unspecified: Secondary | ICD-10-CM | POA: Diagnosis not present

## 2014-02-03 DIAGNOSIS — M79609 Pain in unspecified limb: Secondary | ICD-10-CM | POA: Diagnosis not present

## 2014-02-03 DIAGNOSIS — B351 Tinea unguium: Secondary | ICD-10-CM | POA: Diagnosis not present

## 2014-02-12 DIAGNOSIS — H5000 Unspecified esotropia: Secondary | ICD-10-CM | POA: Diagnosis not present

## 2014-02-12 DIAGNOSIS — H5500 Unspecified nystagmus: Secondary | ICD-10-CM | POA: Diagnosis not present

## 2014-03-14 DIAGNOSIS — N319 Neuromuscular dysfunction of bladder, unspecified: Secondary | ICD-10-CM | POA: Diagnosis not present

## 2014-03-14 DIAGNOSIS — N3020 Other chronic cystitis without hematuria: Secondary | ICD-10-CM | POA: Diagnosis not present

## 2014-04-28 DIAGNOSIS — M79675 Pain in left toe(s): Secondary | ICD-10-CM | POA: Diagnosis not present

## 2014-04-28 DIAGNOSIS — B351 Tinea unguium: Secondary | ICD-10-CM | POA: Diagnosis not present

## 2014-04-28 DIAGNOSIS — M79674 Pain in right toe(s): Secondary | ICD-10-CM | POA: Diagnosis not present

## 2014-05-04 DIAGNOSIS — Z23 Encounter for immunization: Secondary | ICD-10-CM | POA: Diagnosis not present

## 2014-05-06 ENCOUNTER — Telehealth: Payer: Self-pay | Admitting: *Deleted

## 2014-05-06 DIAGNOSIS — Q059 Spina bifida, unspecified: Secondary | ICD-10-CM

## 2014-05-06 NOTE — Telephone Encounter (Signed)
done

## 2014-05-06 NOTE — Telephone Encounter (Signed)
Patient called - stated she is doing fine but needs an RX for a wheel chair.  Would you like to see her or can we just write it?  Please advise

## 2014-05-09 NOTE — Telephone Encounter (Signed)
Please call Pt's mother back - Dr. Naaman Plummer message said it's done... But not sure what that mean.  Mom wanted to know the status of the wheel chair request

## 2014-05-12 NOTE — Telephone Encounter (Signed)
Called patient's mother and told her the order is written and asked her where she wants me to fax it.  Or she can come an pick it up

## 2014-05-15 ENCOUNTER — Telehealth: Payer: Self-pay | Admitting: Physical Medicine & Rehabilitation

## 2014-05-15 MED ORDER — MELOXICAM 15 MG PO TABS: 15.0000 mg | ORAL_TABLET | Freq: Every day | ORAL | Status: DC

## 2014-05-15 NOTE — Telephone Encounter (Signed)
I spoke with Mary Neal and have sent in refill

## 2014-05-15 NOTE — Telephone Encounter (Signed)
[name] (pt's mom) stated patient will be out of her Meloxicam please call her at [phone]

## 2014-06-02 DIAGNOSIS — Z1231 Encounter for screening mammogram for malignant neoplasm of breast: Secondary | ICD-10-CM | POA: Diagnosis not present

## 2014-07-21 DIAGNOSIS — M79675 Pain in left toe(s): Secondary | ICD-10-CM | POA: Diagnosis not present

## 2014-07-21 DIAGNOSIS — M79674 Pain in right toe(s): Secondary | ICD-10-CM | POA: Diagnosis not present

## 2014-07-21 DIAGNOSIS — B351 Tinea unguium: Secondary | ICD-10-CM | POA: Diagnosis not present

## 2014-09-22 ENCOUNTER — Ambulatory Visit: Payer: Self-pay

## 2014-09-22 DIAGNOSIS — M25562 Pain in left knee: Secondary | ICD-10-CM | POA: Diagnosis not present

## 2014-09-22 DIAGNOSIS — M25862 Other specified joint disorders, left knee: Secondary | ICD-10-CM | POA: Diagnosis not present

## 2014-09-22 DIAGNOSIS — S83102A Unspecified subluxation of left knee, initial encounter: Secondary | ICD-10-CM | POA: Diagnosis not present

## 2014-09-22 DIAGNOSIS — M25462 Effusion, left knee: Secondary | ICD-10-CM | POA: Diagnosis not present

## 2014-09-22 DIAGNOSIS — R269 Unspecified abnormalities of gait and mobility: Secondary | ICD-10-CM | POA: Diagnosis not present

## 2014-09-22 DIAGNOSIS — Q059 Spina bifida, unspecified: Secondary | ICD-10-CM | POA: Diagnosis not present

## 2014-09-26 ENCOUNTER — Encounter: Payer: Self-pay | Admitting: Physical Medicine & Rehabilitation

## 2014-09-26 ENCOUNTER — Encounter: Payer: Medicare Other | Attending: Physical Medicine & Rehabilitation | Admitting: Physical Medicine & Rehabilitation

## 2014-09-26 ENCOUNTER — Telehealth: Payer: Self-pay | Admitting: *Deleted

## 2014-09-26 VITALS — BP 131/76 | HR 75 | Resp 14

## 2014-09-26 DIAGNOSIS — G839 Paralytic syndrome, unspecified: Secondary | ICD-10-CM

## 2014-09-26 DIAGNOSIS — S8992XS Unspecified injury of left lower leg, sequela: Secondary | ICD-10-CM

## 2014-09-26 DIAGNOSIS — Q059 Spina bifida, unspecified: Secondary | ICD-10-CM

## 2014-09-26 DIAGNOSIS — G8220 Paraplegia, unspecified: Secondary | ICD-10-CM

## 2014-09-26 DIAGNOSIS — M1712 Unilateral primary osteoarthritis, left knee: Secondary | ICD-10-CM | POA: Diagnosis not present

## 2014-09-26 MED ORDER — HYDROCODONE-ACETAMINOPHEN 5-325 MG PO TABS: 0.5000 | ORAL_TABLET | Freq: Three times a day (TID) | ORAL | Status: DC | PRN

## 2014-09-26 NOTE — Progress Notes (Signed)
Subjective:    Patient ID: Mary Neal, female    DOB: [birth year], 49 y.o.   MRN: [id number]  HPI   Mary Neal is back regarding her chronic pain and spina bifida. She is having more knee pain. xrays were done which showed subluxation and severe tricompartmental disease. The knee pain is sometimes throbbing vs shooting. It bothers her when she's more activite but is also tender when at rest. The tramadol doesn't really seem to help much any more.  She has her new wheelchair but feels that it's too heavy. Her mother remains her main caregiver.    Pain Inventory Average Pain 6 Pain Right Now 7 My pain is sharp and aching  In the last 24 hours, has pain interfered with the following? General activity 4 Relation with others 0 Enjoyment of life 0 What TIME of day is your pain at its worst? evening Sleep (in general) Good  Pain is worse with: unsure Pain improves with: heat/ice Relief from Meds: 0  Mobility use a cane ability to climb steps?  yes do you drive?  no use a wheelchair  Function disabled: date disabled . I need assistance with the following:  meal prep  Neuro/Psych bladder control problems bowel control problems  Prior Studies Any changes since last visit?  no  Physicians involved in your care Any changes since last visit?  no   Family History  Problem Relation Age of Onset  . Hypertension Mother    History   Social History  . Marital Status: Single    Spouse Name: N/A  . Number of Children: N/A  . Years of Education: N/A   Social History Main Topics  . Smoking status: Never Smoker   . Smokeless tobacco: Never Used  . Alcohol Use: No  . Drug Use: No  . Sexual Activity: No   Other Topics Concern  . None   Social History Narrative   History reviewed. No pertinent past surgical history. Past Medical History  Diagnosis Date  . Spina bifida   . Abnormal Pap smear 2013  . Wears glasses   . Uterine prolapse    There were no vitals  taken for this visit.  Opioid Risk Score:   Fall Risk Score:  `1  Depression screen PHQ 2/9  No flowsheet data found.   Review of Systems  Gastrointestinal: Positive for diarrhea and constipation.  Genitourinary:       Incontinence       Objective:   Physical Exam  General: Alert and oriented x 3, No apparent distress  HEENT: Head is normocephalic, atraumatic, PERRLA, EOMI, sclera anicteric, oral mucosa pink and moist, dentition intact, ext ear canals clear,  Neck: Supple without JVD or lymphadenopathy  Heart: Reg rate and rhythm. No murmurs rubs or gallops  Chest: CTA bilaterally without wheezes, rales, or rhonchi; no distress  Abdomen: Soft, non-tender, non-distended, bowel sounds positive.  Extremities: No clubbing, cyanosis, or edema. Pulses are 2+  Skin: Clean and intact without signs of breakdown  Neuro: absent sensation below the lower calf. Profound atrophy of all distal musculature. She has minimal to no hip extension or knee flexion. Cognitively she's generally appropriate, CN notable for dysconjugate gaze. Hearing seems intact.  Musculoskeletal: Likely with complete MCL,ACL, PCL tears. continued internal rotation of the femur with severe valgus deformity at the knee. Has tenderness with PROM and with attempts at AROM .  She did not stand for me today Psych: Pt's affect is appropriate. Pt is cooperative  Assessment & Plan:  1. Spina Bifida with profound distal lower extremity weakness, sensory loss, neurogenic bowel/bladder  2. Left knee pain/ OA with multiple ligament injuries and severe femoral inward rotation and valgus at knee    Plan:  1.Consider a knee immobilizer adaptation to lower limb brace..  2. Likely needs fusion surgery at some point for QOL. Advised that she follow up with ortho for further recs.  3. Will try low dose hydrocodone for more severe pain.(hold tramadol) 4. Mentioned OTC supps she can try for pain. 5. Activity modfication, more  wheelchair use, less walking   Follow up with me in about 2 months. Thirty minutes of face to face patient care time were spent during this visit. All questions were encouraged and answered.

## 2014-09-26 NOTE — Telephone Encounter (Signed)
Wants call back from Dr Naaman Plummer to discuss Ms Lattner who she share care with as well.  [phone]  Md's first name is Joelene Millin.  I couldn't understand her last name due to phone dropping call.

## 2014-09-26 NOTE — Telephone Encounter (Signed)
i called and discussed

## 2014-09-26 NOTE — Patient Instructions (Signed)
RECOMMENDATIONS:  CONSIDER BRACE ADAPTATION TO KEEP YOUR KNEE EXTENDED ALL THE TIME?  CONTINUE MELOXICAM  TRY HYDROCODONE FOR PAIN (STOP ULTRAM FOR NOW)  SEE ORTHOPEDICS FOR RECOMMENDATIONS.   REGULAR HEAT/ICE  SUPPLEMENTS: GLUCOSAMINE/CHONDROITIN, GINGER EXTRACT, OMEGA 3 FATTY ACIDS  MODIFY ACTIVITY SOMEWHAT TO DECREASE STRESS ON YOUR LEFT KNEE. (USE YOUR WHEELCHAIR A LITTLE MORE)

## 2014-10-06 DIAGNOSIS — M79674 Pain in right toe(s): Secondary | ICD-10-CM | POA: Diagnosis not present

## 2014-10-06 DIAGNOSIS — B351 Tinea unguium: Secondary | ICD-10-CM | POA: Diagnosis not present

## 2014-10-06 DIAGNOSIS — M79675 Pain in left toe(s): Secondary | ICD-10-CM | POA: Diagnosis not present

## 2014-10-10 ENCOUNTER — Encounter (HOSPITAL_COMMUNITY): Payer: Self-pay

## 2014-10-10 ENCOUNTER — Emergency Department (INDEPENDENT_AMBULATORY_CARE_PROVIDER_SITE_OTHER)
Admission: EM | Admit: 2014-10-10 | Discharge: 2014-10-10 | Disposition: A | Discharge status: Home / Self Care | Payer: Medicare Other | Source: Home / Self Care | Attending: Family Medicine | Admitting: Family Medicine

## 2014-10-10 DIAGNOSIS — R319 Hematuria, unspecified: Secondary | ICD-10-CM

## 2014-10-10 DIAGNOSIS — N390 Urinary tract infection, site not specified: Secondary | ICD-10-CM

## 2014-10-10 DIAGNOSIS — Q059 Spina bifida, unspecified: Secondary | ICD-10-CM

## 2014-10-10 LAB — URINALYSIS, ROUTINE W REFLEX MICROSCOPIC
Bilirubin Urine: NEGATIVE
Glucose, UA: NEGATIVE mg/dL
Ketones, ur: 15 mg/dL — AB
Nitrite: POSITIVE — AB
Protein, ur: 30 mg/dL — AB
Specific Gravity, Urine: 1.023 (ref 1.005–1.030)
Urobilinogen, UA: 1 mg/dL (ref 0.0–1.0)
pH: 7.5 (ref 5.0–8.0)

## 2014-10-10 LAB — POCT URINALYSIS DIP (DEVICE)
Glucose, UA: 100 mg/dL — AB
Ketones, ur: NEGATIVE mg/dL
Leukocytes, UA: NEGATIVE
Nitrite: POSITIVE — AB
Protein, ur: 100 mg/dL — AB
Specific Gravity, Urine: 1.015 (ref 1.005–1.030)
Urobilinogen, UA: 1 mg/dL (ref 0.0–1.0)
pH: 8.5 — ABNORMAL HIGH (ref 5.0–8.0)

## 2014-10-10 LAB — URINE MICROSCOPIC-ADD ON

## 2014-10-10 MED ORDER — SULFAMETHOXAZOLE-TRIMETHOPRIM 800-160 MG PO TABS: 1.0000 | ORAL_TABLET | Freq: Two times a day (BID) | ORAL | Status: DC

## 2014-10-10 NOTE — Discharge Instructions (Signed)
Your urine shows evidence of blood and infection. The blood is likely from the infection as well as trauma from catheterization. 2 all of your other medications as prescribed. Please start taking Bactrim for the infection. We will call you if you need additional medications. The urine should return to a more normal color within 2 days. Please go to the emergency room if her symptoms get worse.

## 2014-10-10 NOTE — ED Provider Notes (Signed)
CSN: [id number]     Arrival date & time 10/10/14  1259 History   First MD Initiated Contact with Patient 10/10/14 1435     Chief Complaint  Patient presents with  . Hematuria   (Consider location/radiation/quality/duration/timing/severity/associated sxs/prior Treatment) HPI   Blood in urine. Noticed blood in the urine over the last 2 days. Patient self catheterizes she has spina bifida. Patient reports compliance with her Macrobid and oxybutynin. Denies dysuria, frequency, back pain, abdominal pain, fevers, nausea, rash, chest pain, shortness of breath, headache, syncope. Symptoms occur with every catheterization and urine appears to get darker.  Past Medical History  Diagnosis Date  . Spina bifida   . Abnormal Pap smear 2013  . Wears glasses   . Uterine prolapse    History reviewed. No pertinent past surgical history. Family History  Problem Relation Age of Onset  . Hypertension Mother    History  Substance Use Topics  . Smoking status: Never Smoker   . Smokeless tobacco: Never Used  . Alcohol Use: No   OB History    Gravida Para Term Preterm AB TAB SAB Ectopic Multiple Living   0              Review of Systems  Per HPI with all other pertinent systems negative.    Allergies  Avocado; Banana; Latex; and Penicillins  Home Medications   Prior to Admission medications   Medication Sig Start Date End Date Taking? Authorizing Provider  HYDROcodone-acetaminophen (NORCO/VICODIN) 5-325 MG per tablet Take 0.5-1 tablets by mouth every 8 (eight) hours as needed for moderate pain or severe pain. 09/26/14  Yes Meredith Staggers, MD  oxybutynin (DITROPAN XL) 15 MG 24 hr tablet Take 15 mg by mouth daily.   Yes Historical Provider, MD  traMADol (ULTRAM) 50 MG tablet Take 1 tablet (50 mg total) by mouth every 8 (eight) hours as needed for pain. 03/25/13  Yes Meredith Staggers, MD  VOLTAREN 1 % GEL  03/14/13  Yes Historical Provider, MD  nitrofurantoin (MACRODANTIN) 50 MG capsule Take 50  mg by mouth 1 day or 1 dose.    Historical Provider, MD  sulfamethoxazole-trimethoprim (BACTRIM DS,SEPTRA DS) 800-160 MG per tablet Take 1 tablet by mouth 2 (two) times daily. 10/10/14   Waldemar Dickens, MD   BP 144/65 mmHg  Pulse 83  Temp(Src) 98.6 F (37 C) (Oral)  Resp 18  SpO2 98% Physical Exam  Physical Exam  Constitutional: oriented to person, place, and time. appears well-developed and well-nourished. No distress.  HENT:  Head: Normocephalic and atraumatic.  Eyes: EOMI. PERRL.  Neck: Normal range of motion.  Cardiovascular: RRR, no m/r/g, 2+ distal pulses,  Pulmonary/Chest: Effort normal and breath sounds normal. No respiratory distress.  Abdominal: Soft. Bowel sounds are normal. NonTTP, no distension. No suprapubic tenderness  Musculoskeletal: Normal range of motion. Non ttp, no effusion.  no CVA tenderness  Neurological: Alert and oriented 3, no sensation below the ankles, moves all using corrugated fashion.  Skin: Skin is warm. No rash noted. non diaphoretic.  Psychiatric: normal mood and affect. behavior is normal. Judgment and thought content normal.    ED Course  Procedures (including critical care time) Labs Review Labs Reviewed  POCT URINALYSIS DIP (DEVICE) - Abnormal; Notable for the following:    Glucose, UA 100 (*)    Bilirubin Urine SMALL (*)    Hgb urine dipstick LARGE (*)    pH 8.5 (*)    Protein, ur 100 (*)  Nitrite POSITIVE (*)    All other components within normal limits  URINE CULTURE  URINALYSIS, ROUTINE W REFLEX MICROSCOPIC    Imaging Review No results found.   MDM   1. Hematuria   2. Spina bifida   3. UTI (lower urinary tract infection)    Chronic Macrobid and oxybutynin. Has had Bactrim in the past for UTIs. Will prescribe Bactrim again and send urine sample for micro-and culture.  Continue all other medications as prescribed.  Precautions given and all questions answered  Linna Darner, MD Family Medicine 10/10/2014, 3:22  PM      Waldemar Dickens, MD 10/10/14 [phone]

## 2014-10-10 NOTE — ED Notes (Signed)
Patient reports 1 week duration of blood in urine. Spina biffida patient, self caths. No trauma to perineal area noted on inspection

## 2014-10-12 LAB — URINE CULTURE: Colony Count: >=100000

## 2014-10-13 ENCOUNTER — Telehealth (HOSPITAL_COMMUNITY): Payer: Self-pay | Admitting: Family Medicine

## 2014-10-13 MED ORDER — CIPROFLOXACIN HCL 500 MG PO TABS: 500.0000 mg | ORAL_TABLET | Freq: Two times a day (BID) | ORAL | Status: DC

## 2014-10-13 NOTE — ED Notes (Signed)
Urine culture results back. Bacteria resistant to Bactrim. Will switch to Cipro. Medication called in and patient notified.  Gregor Hams, MD 10/13/14 [phone]

## 2014-10-24 ENCOUNTER — Telehealth: Payer: Self-pay | Admitting: *Deleted

## 2014-10-24 MED ORDER — MELOXICAM 7.5 MG PO TABS: 7.5000 mg | ORAL_TABLET | Freq: Every day | ORAL | Status: DC

## 2014-10-24 NOTE — Telephone Encounter (Signed)
I notified her mother.  She wanted to let you know that one half pill is not helping (I assume it is the hydrocodone).  Next appt is 11/24/14

## 2014-10-24 NOTE — Telephone Encounter (Signed)
Calling for refill on meloxicam and asking if it can be 90 day supply.  Looks like we havent rx'd in a while so letting you make the call to refill or not.  Not mentioned in last note.

## 2014-10-24 NOTE — Telephone Encounter (Signed)
It's fine to rx 90 day supply. Needs to keep an eye on GI symptoms (Nausea,abd pain, GERD) and hematuria as well (was in the ED recently for [facility] was probably related to a UTI). rx written

## 2014-10-25 NOTE — Telephone Encounter (Signed)
She may try a full hydrocodone as long as she's tolerating it from a neuro-sedation standpoint

## 2014-10-27 NOTE — Telephone Encounter (Signed)
Notified Mary Neal's mother.  She is trying to take her to a doctor she has seen in [place].

## 2014-10-28 DIAGNOSIS — M25562 Pain in left knee: Secondary | ICD-10-CM | POA: Diagnosis not present

## 2014-10-28 DIAGNOSIS — Z79899 Other long term (current) drug therapy: Secondary | ICD-10-CM | POA: Diagnosis not present

## 2014-11-17 DIAGNOSIS — M25569 Pain in unspecified knee: Secondary | ICD-10-CM | POA: Diagnosis not present

## 2014-11-17 DIAGNOSIS — Q059 Spina bifida, unspecified: Secondary | ICD-10-CM | POA: Diagnosis not present

## 2014-11-17 DIAGNOSIS — S83106A Unspecified dislocation of unspecified knee, initial encounter: Secondary | ICD-10-CM | POA: Diagnosis not present

## 2014-11-24 ENCOUNTER — Encounter: Payer: Self-pay | Admitting: Physical Medicine & Rehabilitation

## 2014-11-24 ENCOUNTER — Encounter: Payer: Medicare Other | Attending: Physical Medicine & Rehabilitation | Admitting: Physical Medicine & Rehabilitation

## 2014-11-24 VITALS — BP 115/59 | HR 82 | Resp 14

## 2014-11-24 DIAGNOSIS — M179 Osteoarthritis of knee, unspecified: Secondary | ICD-10-CM | POA: Diagnosis not present

## 2014-11-24 DIAGNOSIS — M1712 Unilateral primary osteoarthritis, left knee: Secondary | ICD-10-CM

## 2014-11-24 DIAGNOSIS — G8220 Paraplegia, unspecified: Secondary | ICD-10-CM

## 2014-11-24 DIAGNOSIS — S8992XS Unspecified injury of left lower leg, sequela: Secondary | ICD-10-CM | POA: Diagnosis not present

## 2014-11-24 DIAGNOSIS — G839 Paralytic syndrome, unspecified: Secondary | ICD-10-CM

## 2014-11-24 DIAGNOSIS — Q059 Spina bifida, unspecified: Secondary | ICD-10-CM | POA: Insufficient documentation

## 2014-11-24 MED ORDER — BUPRENORPHINE 10 MCG/HR TD PTWK: 10.0000 ug | MEDICATED_PATCH | TRANSDERMAL | Status: DC

## 2014-11-24 MED ORDER — HYDROCODONE-ACETAMINOPHEN 5-325 MG PO TABS: 0.5000 | ORAL_TABLET | Freq: Three times a day (TID) | ORAL | Status: DC | PRN

## 2014-11-24 NOTE — Progress Notes (Signed)
Subjective:    Patient ID: Mary Neal, female    DOB: 13-Sep-1965, 49 y.o.   MRN: [id number]  HPI   [name] is here in follow up of her chronic pain/spina bifida. She is still having pain, although the pain is happening now even at rest. She was placed on gabapentin recently which hasn't given her relief. Her hydrocodone seems to help somewhat, but she isn't always using this consistently.   Mary Neal and her mother are now considering the knee fusion but are interested in other opinions regarding the surgery.  She continues in her wheelchair and knee brace. She limits her walking when she can.    Pain Inventory Average Pain 6 Pain Right Now 7 My pain is sharp and aching  In the last 24 hours, has pain interfered with the following? General activity 4 Relation with others 0 Enjoyment of life 0 What TIME of day is your pain at its worst? evening Sleep (in general) Good  Pain is worse with: unsure Pain improves with: heat/ice Relief from Meds: 0  Mobility walk with assistance use a cane ability to climb steps?  yes do you drive?  no use a wheelchair  Function disabled: date disabled . I need assistance with the following:  meal prep  Neuro/Psych bladder control problems bowel control problems  Prior Studies Any changes since last visit?  no  Physicians involved in your care Any changes since last visit?  no   Family History  Problem Relation Age of Onset  . Hypertension Mother    History   Social History  . Marital Status: Single    Spouse Name: N/A  . Number of Children: N/A  . Years of Education: N/A   Social History Main Topics  . Smoking status: Never Smoker   . Smokeless tobacco: Never Used  . Alcohol Use: No  . Drug Use: No  . Sexual Activity: No   Other Topics Concern  . None   Social History Narrative   History reviewed. No pertinent past surgical history. Past Medical History  Diagnosis Date  . Spina bifida   . Abnormal Pap  smear 2013  . Wears glasses   . Uterine prolapse    BP 115/59 mmHg  Pulse 82  Resp 14  SpO2 100%  Opioid Risk Score:   Fall Risk Score:  `1  Depression screen PHQ 2/9  Depression screen PHQ 2/9 09/26/2014  Decreased Interest 1  Down, Depressed, Hopeless 1  PHQ - 2 Score 2  Altered sleeping 0  Tired, decreased energy 1  Change in appetite 0  Feeling bad or failure about yourself  0  Trouble concentrating 0  Moving slowly or fidgety/restless 0  Suicidal thoughts 0  PHQ-9 Score 3    2 Review of Systems  All other systems reviewed and are negative.      Objective:   Physical Exam  General: Alert and oriented x 3, No apparent distress  HEENT: Head is normocephalic, atraumatic, PERRLA, EOMI, sclera anicteric, oral mucosa pink and moist, dentition intact, ext ear canals clear,  Neck: Supple without JVD or lymphadenopathy  Heart: Reg rate and rhythm. No murmurs rubs or gallops  Chest: CTA bilaterally without wheezes, rales, or rhonchi; no distress  Abdomen: Soft, non-tender, non-distended, bowel sounds positive.  Extremities: No clubbing, cyanosis, or edema. Pulses are 2+  Skin: Clean and intact without signs of breakdown  Neuro: absent sensation below the lower calf. Profound atrophy of all distal musculature. She has minimal  to no hip extension or knee flexion. Cognitively she's generally appropriate, CN notable for dysconjugate gaze. Hearing seems intact.  Musculoskeletal: Likely with complete MCL,ACL, PCL tears.  Ongoing internal rotation of the femur with severe valgus deformity at the knee. Has tenderness with PROM and with attempts at AROM . She did not stand for me today again.  Psych: Pt's affect is appropriate. Pt is cooperative    Assessment & Plan:  1. Spina Bifida with profound distal lower extremity weakness, sensory loss, neurogenic bowel/bladder  2. Left knee pain/ OA with multiple ligament injuries and severe femoral inward rotation and valgus at knee    Plan:  1.Consider a knee immobilizer add-on to lower limb brace. 2. Needs knee fusion surgery at some point for QOL. Advised that she follow up with ortho for further recs. I can check into who might be willing to perform that surgery locally. 3. Continue low dose hydrocodone for more severe pain.   4. Add low dose butrans for pain relief---think she might do well with this given that she's hesistant to take the hydrocodone much.   5. Activity modfication, more wheelchair use, less walking   Follow up with me or NP in about 1 month. Thirty minutes of face to face patient care time were spent during this visit. All questions were encouraged and answered.

## 2014-11-24 NOTE — Patient Instructions (Signed)
PLEASE CALL ME WITH ANY PROBLEMS OR QUESTIONS (#[phone]).      

## 2014-11-28 ENCOUNTER — Telehealth: Payer: Self-pay | Admitting: *Deleted

## 2014-11-28 NOTE — Telephone Encounter (Signed)
i would try an ACE wrap and ICE TID to left knee.  The knee is going to continue to swell unfortunately.   She may shower with patch on. Just try to keep it as dry as possible while showering.   How is patch working?

## 2014-11-28 NOTE — Telephone Encounter (Signed)
Mother is calling, asking if it is okay to take a shower with her patch Haze Rushing) applied and mother also reports that her daughters left knee is swollen w/o rash and is wondering what can be done about that. I know this is redundant as your last clinic note shows that you had conversations about her left knee issues and possible course of treatment. Please advise

## 2014-11-28 NOTE — Telephone Encounter (Signed)
Mom says pt started wearing the patch yesterday and reports that she is feeling improvement. I passed on your suggestion to the mother ice the knee 3 times a day

## 2014-12-03 DIAGNOSIS — M25562 Pain in left knee: Secondary | ICD-10-CM | POA: Diagnosis not present

## 2014-12-03 DIAGNOSIS — R030 Elevated blood-pressure reading, without diagnosis of hypertension: Secondary | ICD-10-CM | POA: Diagnosis not present

## 2014-12-03 DIAGNOSIS — L42 Pityriasis rosea: Secondary | ICD-10-CM | POA: Diagnosis not present

## 2014-12-09 DIAGNOSIS — M179 Osteoarthritis of knee, unspecified: Secondary | ICD-10-CM | POA: Diagnosis not present

## 2014-12-09 DIAGNOSIS — M25562 Pain in left knee: Secondary | ICD-10-CM | POA: Diagnosis not present

## 2014-12-26 ENCOUNTER — Encounter: Payer: Self-pay | Admitting: Podiatry

## 2014-12-26 ENCOUNTER — Ambulatory Visit (INDEPENDENT_AMBULATORY_CARE_PROVIDER_SITE_OTHER): Payer: Medicare Other | Admitting: Podiatry

## 2014-12-26 VITALS — BP 123/63 | HR 84 | Resp 18

## 2014-12-26 DIAGNOSIS — M79676 Pain in unspecified toe(s): Secondary | ICD-10-CM

## 2014-12-26 DIAGNOSIS — M79675 Pain in left toe(s): Secondary | ICD-10-CM

## 2014-12-26 DIAGNOSIS — L89891 Pressure ulcer of other site, stage 1: Secondary | ICD-10-CM | POA: Diagnosis not present

## 2014-12-26 DIAGNOSIS — M79674 Pain in right toe(s): Secondary | ICD-10-CM

## 2014-12-26 DIAGNOSIS — B351 Tinea unguium: Secondary | ICD-10-CM | POA: Diagnosis not present

## 2014-12-26 DIAGNOSIS — L97421 Non-pressure chronic ulcer of left heel and midfoot limited to breakdown of skin: Secondary | ICD-10-CM

## 2014-12-27 NOTE — Progress Notes (Signed)
Subjective:     Patient ID: Mary Neal, female   DOB: 01/18/1966, 49 y.o.   MRN: [id number]  HPIThis patient presents to the office with pain noted on the inside of her left foot.  She was seen initially years ago and we closed her ulcer on the left foot.  This patient has spina bifida and walks with braces.  She has recently been evaluated for knee problems left leg.She says her ulcer is becoming more painful and her callus top has thickened.   Review of Systems     Objective:   Physical Exam Objective: Review of past medical history, medications, social history and allergies were performed.  Vascular: Dorsalis pedis and posterior tibial pulses were absent  B/L, capillary refill was  WNL B/L, temperature gradient was diminished revealing cold feet   Skin:  There is a hyperkeratotic roof over her ulcer on medial aspect left foot over the navicular articula.  No signs of redness infection or drainage.tions  Nails: thick disfigured discolored hallux toenails B/L  Sensory: Semmes Weinstein monifilament Absent     Assessment:     Ulcer left foot  Onychomycosis    Plan:     Debridement of ulcer left foot.    Debridement of onychomycotic nails

## 2014-12-29 ENCOUNTER — Ambulatory Visit: Payer: Medicare Other | Admitting: Registered Nurse

## 2014-12-29 DIAGNOSIS — E559 Vitamin D deficiency, unspecified: Secondary | ICD-10-CM | POA: Diagnosis not present

## 2014-12-29 DIAGNOSIS — R7309 Other abnormal glucose: Secondary | ICD-10-CM | POA: Diagnosis not present

## 2014-12-29 DIAGNOSIS — Z0000 Encounter for general adult medical examination without abnormal findings: Secondary | ICD-10-CM | POA: Diagnosis not present

## 2014-12-29 DIAGNOSIS — M25562 Pain in left knee: Secondary | ICD-10-CM | POA: Diagnosis not present

## 2014-12-29 DIAGNOSIS — Q059 Spina bifida, unspecified: Secondary | ICD-10-CM | POA: Diagnosis not present

## 2014-12-29 DIAGNOSIS — I10 Essential (primary) hypertension: Secondary | ICD-10-CM | POA: Diagnosis not present

## 2014-12-29 DIAGNOSIS — N319 Neuromuscular dysfunction of bladder, unspecified: Secondary | ICD-10-CM | POA: Diagnosis not present

## 2014-12-29 DIAGNOSIS — R413 Other amnesia: Secondary | ICD-10-CM | POA: Diagnosis not present

## 2014-12-29 DIAGNOSIS — R739 Hyperglycemia, unspecified: Secondary | ICD-10-CM | POA: Diagnosis not present

## 2015-01-02 ENCOUNTER — Encounter: Payer: Medicare Other | Attending: Physical Medicine & Rehabilitation | Admitting: Registered Nurse

## 2015-01-02 ENCOUNTER — Other Ambulatory Visit: Payer: Self-pay | Admitting: Registered Nurse

## 2015-01-02 ENCOUNTER — Encounter: Payer: Self-pay | Admitting: Registered Nurse

## 2015-01-02 VITALS — BP 129/65 | HR 79 | Resp 16

## 2015-01-02 DIAGNOSIS — Q059 Spina bifida, unspecified: Secondary | ICD-10-CM | POA: Diagnosis not present

## 2015-01-02 DIAGNOSIS — R609 Edema, unspecified: Secondary | ICD-10-CM

## 2015-01-02 DIAGNOSIS — Z79899 Other long term (current) drug therapy: Secondary | ICD-10-CM | POA: Diagnosis not present

## 2015-01-02 DIAGNOSIS — M1712 Unilateral primary osteoarthritis, left knee: Secondary | ICD-10-CM | POA: Diagnosis not present

## 2015-01-02 DIAGNOSIS — Z5181 Encounter for therapeutic drug level monitoring: Secondary | ICD-10-CM | POA: Diagnosis not present

## 2015-01-02 DIAGNOSIS — M179 Osteoarthritis of knee, unspecified: Secondary | ICD-10-CM

## 2015-01-02 DIAGNOSIS — G839 Paralytic syndrome, unspecified: Secondary | ICD-10-CM | POA: Diagnosis not present

## 2015-01-02 DIAGNOSIS — G8220 Paraplegia, unspecified: Secondary | ICD-10-CM

## 2015-01-02 MED ORDER — BUPRENORPHINE 10 MCG/HR TD PTWK: 10.0000 ug | MEDICATED_PATCH | TRANSDERMAL | Status: DC

## 2015-01-02 MED ORDER — HYDROCODONE-ACETAMINOPHEN 7.5-325 MG PO TABS: 1.0000 | ORAL_TABLET | Freq: Three times a day (TID) | ORAL | Status: DC | PRN

## 2015-01-02 NOTE — Progress Notes (Signed)
Subjective:    Patient ID: Mary Neal, female    DOB: 05/13/1966, 49 y.o.   MRN: [id number]  HPI: Mary Neal is a 49 year old female who returns for follow up for chronic pain and medication refill. She says her pain is located in her left knee. Also states the hydrocodone 5 mg is not effective for break through pain, the pain has increased in intensity, will increase to 7.5mg . Was instructed to try this regimen and call on Monday 01/05/15. She rates her pain 8. Her current exercise regime is walking short distances using loftstrand crutches.  Pain Inventory Average Pain 8 Pain Right Now 8 My pain is sharp and stabbing  In the last 24 hours, has pain interfered with the following? General activity 7 Relation with others 9 Enjoyment of life 10 What TIME of day is your pain at its worst? varies Sleep (in general) Fair  Pain is worse with: some activites Pain improves with: heat/ice and medication Relief from Meds: 0  Mobility ability to climb steps?  yes do you drive?  no use a wheelchair transfers alone  Function disabled: date disabled . I need assistance with the following:  bathing  Neuro/Psych bladder control problems bowel control problems trouble walking  Prior Studies Any changes since last visit?  no  Physicians involved in your care Any changes since last visit?  no   Family History  Problem Relation Age of Onset  . Hypertension Mother    History   Social History  . Marital Status: Single    Spouse Name: N/A  . Number of Children: N/A  . Years of Education: N/A   Social History Main Topics  . Smoking status: Never Smoker   . Smokeless tobacco: Never Used  . Alcohol Use: No  . Drug Use: No  . Sexual Activity: No   Other Topics Concern  . None   Social History Narrative   History reviewed. No pertinent past surgical history. Past Medical History  Diagnosis Date  . Spina bifida   . Abnormal Pap smear 2013  . Wears  glasses   . Uterine prolapse    BP 129/65 mmHg  Pulse 79  Resp 16  SpO2 99%  Opioid Risk Score:   Fall Risk Score: Moderate Fall Risk (6-13 points) (educated and given handout on fall prevention in the home.)`1  Depression screen PHQ 2/9  Depression screen PHQ 2/9 09/26/2014  Decreased Interest 1  Down, Depressed, Hopeless 1  PHQ - 2 Score 2  Altered sleeping 0  Tired, decreased energy 1  Change in appetite 0  Feeling bad or failure about yourself  0  Trouble concentrating 0  Moving slowly or fidgety/restless 0  Suicidal thoughts 0  PHQ-9 Score 3     Review of Systems  Gastrointestinal:       Bowel Control Problems  Genitourinary:       Bladder control problems  Musculoskeletal: Positive for gait problem.  All other systems reviewed and are negative.      Objective:   Physical Exam  Constitutional: She is oriented to person, place, and time. She appears well-developed and well-nourished.  HENT:  Head: Normocephalic and atraumatic.  Neck: Normal range of motion. Neck supple.  Cardiovascular: Normal rate and regular rhythm.   Pulmonary/Chest: Effort normal and breath sounds normal.  Musculoskeletal:  Normal Muscle Bulk and Muscle Testing Reveals: Upper Extremities: Full ROM and Muscle strength 5/5 Lower Extremities: Bilateral Braces Intact. Lower Extremities Swelling  Noted/ Paresis Noted Arrived in wheelchair   Neurological: She is alert and oriented to person, place, and time.  Skin: Skin is warm and dry.  Psychiatric: She has a normal mood and affect.  Nursing note and vitals reviewed.         Assessment & Plan:  1. Spina Bifida with profound distal lower extremity weakness, sensory loss, neurogenic bowel/bladder . Continue with Bowel and Bladder Program 2. Left knee pain/ OA with multiple ligament injuries and severe femoral inward rotation and valgus at knee: Ortho Following Refilled:  Butran 41mcg/hr weekly #10. Continue Hydrocodone for break  through Pain . Increased to 7.5 mg  One tablet every 8 hours as needed for moderate to sever pain #45. 3. Edema: RX: Compression Stockiings. 15-20 4. Activity modfication, more wheelchair use, less walking was re-iterated.    45 minutes of face to face patient care time was spent during this visit. All questions were encouraged and answered.

## 2015-01-03 LAB — PMP ALCOHOL METABOLITE (ETG): Ethyl Glucuronide (EtG): NEGATIVE ng/mL

## 2015-01-06 LAB — BUPRENORPHINES (GC/LC/MS), URINE
Buprenorphine: 15.2 ng/mL — AB (ref <2)
Norbuprenorphine: 17.8 ng/mL — AB (ref <2)

## 2015-01-07 ENCOUNTER — Telehealth: Payer: Self-pay | Admitting: *Deleted

## 2015-01-07 LAB — PRESCRIPTION MONITORING PROFILE (SOLSTAS)
Amphetamine/Meth: NEGATIVE ng/mL
Barbiturate Screen, Urine: NEGATIVE ng/mL
Benzodiazepine Screen, Urine: NEGATIVE ng/mL
Cannabinoid Scrn, Ur: NEGATIVE ng/mL
Carisoprodol, Urine: NEGATIVE ng/mL
Cocaine Metabolites: NEGATIVE ng/mL
Creatinine, Urine: 140.27 mg/dL (ref >20.0)
Fentanyl, Ur: NEGATIVE ng/mL
MDMA URINE: NEGATIVE ng/mL
Meperidine, Ur: NEGATIVE ng/mL
Methadone Screen, Urine: NEGATIVE ng/mL
Nitrites, Initial: NEGATIVE ug/mL
Opiate Screen, Urine: NEGATIVE ng/mL
Oxycodone Screen, Ur: NEGATIVE ng/mL
Propoxyphene: NEGATIVE ng/mL
Tapentadol, urine: NEGATIVE ng/mL
Tramadol Scrn, Ur: NEGATIVE ng/mL
Zolpidem, Urine: NEGATIVE ng/mL
pH, Initial: 7 pH (ref 4.5–8.9)

## 2015-01-07 NOTE — Telephone Encounter (Signed)
Pt called having increased pain in her leg asking for [name] to call back

## 2015-01-07 NOTE — Telephone Encounter (Signed)
Spoke to Ms. Melick ( mother)   Earlier today she states Laxmi  Was still having increase intensity of pain. I explained I wanted to speak with Dr. Naaman Plummer.  I called and spoke with [name] Mother instructed to use hydrocodone 7.5/325 mg BID as needed for pain. She verbalizes understanding.

## 2015-01-09 DIAGNOSIS — L0390 Cellulitis, unspecified: Secondary | ICD-10-CM | POA: Diagnosis not present

## 2015-01-09 DIAGNOSIS — R296 Repeated falls: Secondary | ICD-10-CM | POA: Diagnosis not present

## 2015-01-09 DIAGNOSIS — Q059 Spina bifida, unspecified: Secondary | ICD-10-CM | POA: Diagnosis not present

## 2015-01-20 NOTE — Progress Notes (Signed)
Urine drug screen for this encounter is consistent for prescribed medication buprenorphine. Has not had hydrocodone recently.

## 2015-01-26 ENCOUNTER — Telehealth: Payer: Self-pay | Admitting: *Deleted

## 2015-01-26 ENCOUNTER — Telehealth: Payer: Self-pay | Admitting: Registered Nurse

## 2015-01-26 MED ORDER — BUPRENORPHINE 15 MCG/HR TD PTWK: 15.0000 ug | MEDICATED_PATCH | TRANSDERMAL | Status: DC

## 2015-01-26 NOTE — Telephone Encounter (Signed)
Pt's mtr called and requested to speak with DR. Prudence Davidson.  I informed pt's mtr, Harriet that Dr. Prudence Davidson was not currently in the office and I did not have the availability of his previous office records.  I told her if she would give me, a message I would try to help or pass it to Dr. Prudence Davidson.  Harriet refused and only wanted to speak with Dr. Prudence Davidson.

## 2015-01-26 NOTE — Telephone Encounter (Signed)
Dr. Naaman Plummer agrees with increasing Butrans to 15 mcg,  Spoke with [name]. Sammon she will return prescription 82mcg and pick up 51mcg. She verbalizes understanding.  Only one script given. Entry error

## 2015-01-26 NOTE — Telephone Encounter (Signed)
Mary Neal Mother of Mary Neal called stating Mary Neal still having pain. I increased her hydrocodone to 7.5 mg last month. Will speak with Dr. Naaman Plummer regarding increasing Butrans to 15 mcg. She verbalizes understanding.

## 2015-02-06 ENCOUNTER — Ambulatory Visit: Payer: Medicare Other | Admitting: Podiatry

## 2015-02-13 ENCOUNTER — Encounter: Payer: Medicare Other | Attending: Physical Medicine & Rehabilitation | Admitting: Registered Nurse

## 2015-02-13 ENCOUNTER — Encounter: Payer: Self-pay | Admitting: Registered Nurse

## 2015-02-13 VITALS — BP 118/72 | HR 80 | Resp 16

## 2015-02-13 DIAGNOSIS — Q059 Spina bifida, unspecified: Secondary | ICD-10-CM | POA: Insufficient documentation

## 2015-02-13 DIAGNOSIS — G839 Paralytic syndrome, unspecified: Secondary | ICD-10-CM | POA: Insufficient documentation

## 2015-02-13 DIAGNOSIS — Z5181 Encounter for therapeutic drug level monitoring: Secondary | ICD-10-CM | POA: Diagnosis not present

## 2015-02-13 DIAGNOSIS — M179 Osteoarthritis of knee, unspecified: Secondary | ICD-10-CM

## 2015-02-13 DIAGNOSIS — M1712 Unilateral primary osteoarthritis, left knee: Secondary | ICD-10-CM | POA: Diagnosis not present

## 2015-02-13 DIAGNOSIS — Z79899 Other long term (current) drug therapy: Secondary | ICD-10-CM

## 2015-02-13 DIAGNOSIS — G8220 Paraplegia, unspecified: Secondary | ICD-10-CM

## 2015-02-13 MED ORDER — BUPRENORPHINE 15 MCG/HR TD PTWK: 15.0000 ug | MEDICATED_PATCH | TRANSDERMAL | Status: DC

## 2015-02-13 MED ORDER — HYDROCODONE-ACETAMINOPHEN 7.5-325 MG PO TABS: 1.0000 | ORAL_TABLET | Freq: Three times a day (TID) | ORAL | Status: DC | PRN

## 2015-02-13 NOTE — Progress Notes (Signed)
Subjective:    Patient ID: Mary Neal, female    DOB: [birth year], 49 y.o.   MRN: [id number]  HPI: [name] is a 49 year old female who returns for follow up for chronic pain and medication refill. She says her pain is located in her left knee. Also states she having sharp burning and tingling sensation. According to [facility] she is on Gabapentin, pharmacy called and states this medication was picked up on 11/18/14. [name] state they will look for the medication and call the office.  She did not  rate her pain . Her current exercise regime is walking short distances using loftstrand crutches. Mother in room all questions answered.  Pain Inventory Average Pain did not answer Pain Right Now did not answer My pain is sharp and stabbing  In the last 24 hours, has pain interfered with the following? General activity 0 Relation with others 0 Enjoyment of life 0 What TIME of day is your pain at its worst? morning and evening Sleep (in general) Fair  Pain is worse with: some activites Pain improves with: rest, heat/ice and medication Relief from Meds: no answer written but says not helping too much  Mobility walk with assistance use a walker use a wheelchair needs help with transfers  Function disabled: date disabled . I need assistance with the following:  bathing and meal prep  Neuro/Psych bladder control problems bowel control problems  Prior Studies Any changes since last visit?  no  Physicians involved in your care Any changes since last visit?  no   Family History  Problem Relation Age of Onset  . Hypertension Mother    History   Social History  . Marital Status: Single    Spouse Name: N/A  . Number of Children: N/A  . Years of Education: N/A   Social History Main Topics  . Smoking status: Never Smoker   . Smokeless tobacco: Never Used  . Alcohol Use: No  . Drug Use: No  . Sexual Activity: No   Other Topics Concern  . None    Social History Narrative   History reviewed. No pertinent past surgical history. Past Medical History  Diagnosis Date  . Spina bifida   . Abnormal Pap smear 2013  . Wears glasses   . Uterine prolapse    BP 118/72 mmHg  Pulse 80  Resp 16  SpO2 100%  Opioid Risk Score:   Fall Risk Score:  `1  Depression screen PHQ 2/9  Depression screen [facility] 2/9 02/13/2015 09/26/2014  Decreased Interest 1 1  Down, Depressed, Hopeless 1 1  PHQ - 2 Score 2 2  Altered sleeping - 0  Tired, decreased energy - 1  Change in appetite - 0  Feeling bad or failure about yourself  - 0  Trouble concentrating - 0  Moving slowly or fidgety/restless - 0  Suicidal thoughts - 0  PHQ-9 Score - 3     Review of Systems  Gastrointestinal: Positive for diarrhea and constipation.  Genitourinary:       Bladder control problems  All other systems reviewed and are negative.      Objective:   Physical Exam  Constitutional: She is oriented to person, place, and time. She appears well-developed and well-nourished.  HENT:  Head: Normocephalic and atraumatic.  Neck: Normal range of motion. Neck supple.  Cardiovascular: Normal rate and regular rhythm.   Pulmonary/Chest: Effort normal and breath sounds normal.  Musculoskeletal:  Normal Muscle Bulk and Muscle  Testing Reveals: Upper Extremities: Full ROM and Muscle Strength 5/5 Lower Extremities: Right: Full ROM and Muscle Strength 5/5 Left: Decreased ROM: Left Lower Extremity Flexion Produces Pain into Patella Bilateral Lower Extremity Braces Intact Arrived in wheelchair  Neurological: She is alert and oriented to person, place, and time.  Skin: Skin is warm and dry.  Psychiatric: She has a normal mood and affect.  Nursing note and vitals reviewed.         Assessment & Plan:  1. Spina Bifida with profound distal lower extremity weakness, sensory loss, neurogenic bowel/bladder . Continue with Bowel and Bladder Program 2. Left knee pain/ OA with  multiple ligament injuries and severe femoral inward rotation and valgus at knee: Ortho Following Refilled: Butran 8mcg/hr weekly #4. Continue Hydrocodone for break through Pain . Refilled: Hydrocodone 7.5/325 mg one tablet every 8 hours as needed for moderate to sever pain #45  3. Edema: Continue Compression Stockiings. 15-20 4. Activity modfication, more wheelchair use, less walking was re-iterated.   30 minutes of face to face patient care time was spent during this visit. All questions were encouraged and answered.

## 2015-02-16 ENCOUNTER — Telehealth: Payer: Self-pay | Admitting: *Deleted

## 2015-02-16 NOTE — Telephone Encounter (Signed)
Kendallyn mother called about her gabapentin and wanted [name] to call her.  She has the bottle and it says 100mg  gabapentin, take two capsules bid.  She is not sure if Dr Naaman Plummer wants her to take it.  [name] said for her to start off taking 100 mg q hs for one week, and call us back next week and let her know how it went. She will do that.

## 2015-02-23 ENCOUNTER — Telehealth: Payer: Self-pay | Admitting: Physical Medicine & Rehabilitation

## 2015-02-23 NOTE — Telephone Encounter (Signed)
Patient would like [name] to call her about her left leg her number is [phone]

## 2015-02-23 NOTE — Telephone Encounter (Signed)
I spoke with [name], Complaining of neuropathic pain will increase Gabapentin 200 mg TID.  She verbalizes understanding.

## 2015-03-12 ENCOUNTER — Encounter: Payer: Medicare Other | Attending: Physical Medicine & Rehabilitation | Admitting: Registered Nurse

## 2015-03-12 ENCOUNTER — Encounter: Payer: Self-pay | Admitting: Registered Nurse

## 2015-03-12 VITALS — BP 101/54 | HR 62 | Resp 14

## 2015-03-12 DIAGNOSIS — M1712 Unilateral primary osteoarthritis, left knee: Secondary | ICD-10-CM | POA: Insufficient documentation

## 2015-03-12 DIAGNOSIS — G8220 Paraplegia, unspecified: Secondary | ICD-10-CM

## 2015-03-12 DIAGNOSIS — M179 Osteoarthritis of knee, unspecified: Secondary | ICD-10-CM

## 2015-03-12 DIAGNOSIS — Q059 Spina bifida, unspecified: Secondary | ICD-10-CM | POA: Insufficient documentation

## 2015-03-12 DIAGNOSIS — Z5181 Encounter for therapeutic drug level monitoring: Secondary | ICD-10-CM

## 2015-03-12 DIAGNOSIS — Z79899 Other long term (current) drug therapy: Secondary | ICD-10-CM

## 2015-03-12 DIAGNOSIS — G839 Paralytic syndrome, unspecified: Secondary | ICD-10-CM | POA: Insufficient documentation

## 2015-03-12 MED ORDER — BUPRENORPHINE 15 MCG/HR TD PTWK: 15.0000 ug | MEDICATED_PATCH | TRANSDERMAL | Status: DC

## 2015-03-12 MED ORDER — HYDROCODONE-ACETAMINOPHEN 7.5-325 MG PO TABS: 1.0000 | ORAL_TABLET | Freq: Three times a day (TID) | ORAL | Status: DC | PRN

## 2015-03-12 NOTE — Progress Notes (Signed)
Subjective:    Patient ID: Mary Neal, female    DOB: [birth year], 49 y.o.   MRN: [id number]  HPI: [name] is a 49 year old female who returns for follow up for chronic pain and medication refill. She says her pain is located in her left knee. Still stating  she's having sharp burning and tingling sensation. Will increased her Gabapentin to 300 mg TID, also instructed to call office on 03/17/15 she verbalizes understanding. She rates her pain 8 . Her current exercise regime is walking short distances using loftstrand crutches. Usually in her wheelchair Mother in room all questions answered.  Pain Inventory Average Pain 7 Pain Right Now 8 My pain is stabbing  In the last 24 hours, has pain interfered with the following? General activity 0 Relation with others 0 Enjoyment of life 0 What TIME of day is your pain at its worst? daytime Sleep (in general) Good  Pain is worse with: walking, standing and some activites Pain improves with: rest Relief from Meds: na  Mobility ability to climb steps?  yes do you drive?  no use a wheelchair  Function I need assistance with the following:  meal prep  Neuro/Psych bladder control problems bowel control problems  Prior Studies Any changes since last visit?  no  Physicians involved in your care Any changes since last visit?  no   Family History  Problem Relation Age of Onset  . Hypertension Mother    Social History   Social History  . Marital Status: Single    Spouse Name: N/A  . Number of Children: N/A  . Years of Education: N/A   Social History Main Topics  . Smoking status: Never Smoker   . Smokeless tobacco: Never Used  . Alcohol Use: No  . Drug Use: No  . Sexual Activity: No   Other Topics Concern  . None   Social History Narrative   ** Merged History Encounter **       History reviewed. No pertinent past surgical history. Past Medical History  Diagnosis Date  . Spina bifida   .  Abnormal Pap smear 2013  . Wears glasses   . Uterine prolapse    BP 101/54 mmHg  Pulse 62  Resp 14  SpO2 99%  Opioid Risk Score:   Fall Risk Score:  `1  Depression screen PHQ 2/9  Depression screen [facility] 2/9 03/12/2015 02/13/2015 09/26/2014  Decreased Interest 0 1 1  Down, Depressed, Hopeless 0 1 1  PHQ - 2 Score 0 2 2  Altered sleeping 1 - 0  Tired, decreased energy 0 - 1  Change in appetite 0 - 0  Feeling bad or failure about yourself  0 - 0  Trouble concentrating 0 - 0  Moving slowly or fidgety/restless 1 - 0  Suicidal thoughts 0 - 0  PHQ-9 Score 2 - 3      Review of Systems  Gastrointestinal: Positive for diarrhea and constipation.       Bowel control problems  Genitourinary:       Bladder control problems   All other systems reviewed and are negative.      Objective:   Physical Exam  Constitutional: She is oriented to person, place, and time. She appears well-developed and well-nourished.  HENT:  Head: Normocephalic and atraumatic.  Neck: Normal range of motion. Neck supple.  Cardiovascular: Normal rate and regular rhythm.   Pulmonary/Chest: Effort normal and breath sounds normal.  Musculoskeletal:  Normal Muscle  Bulk and Muscle Testing Reveals: Upper Extremities: Full ROM and Muscle Strength 5/5 Lower Extremities: Right: Full ROM and Muscle Strength 5/5 Left: Decreased ROM with Extension and Flexion Flexion Produces Pain into Patella/ Swelling Noted Bilateral Lower Extremities Braces Intact Arrived in wheelchair  Neurological: She is alert and oriented to person, place, and time.  Skin: Skin is warm and dry.  Psychiatric: She has a normal mood and affect.  Nursing note and vitals reviewed.         Assessment & Plan:  1. Spina Bifida with profound distal lower extremity weakness, sensory loss, neurogenic bowel/bladder . Continue with Bowel and Bladder Program 2. Left knee pain/ OA with multiple ligament injuries and severe femoral inward rotation and  valgus at knee: Ortho Following Refilled: Butran 25mcg/hr weekly #4. Increased: Hydrocodone 7.5/325 mg one tablet every 8 hours as needed for moderate to sever pain #55  3. Edema: Continue Compression Stockiings. 15-20 4. Activity modfication, more wheelchair use, less walking was re-iterated.   30 minutes of face to face patient care time was spent during this visit. All questions were encouraged and answered.   F/U with Dr. Naaman Plummer

## 2015-03-18 ENCOUNTER — Telehealth: Payer: Self-pay | Admitting: *Deleted

## 2015-03-18 NOTE — Telephone Encounter (Signed)
Pt asking for [name] to call her back about the pain in her leg

## 2015-03-20 ENCOUNTER — Ambulatory Visit: Payer: Medicare Other | Admitting: Registered Nurse

## 2015-03-20 NOTE — Telephone Encounter (Signed)
Return Call : Left Message to Call Office back

## 2015-03-27 ENCOUNTER — Ambulatory Visit: Payer: Medicare Other | Admitting: Registered Nurse

## 2015-04-01 DIAGNOSIS — N319 Neuromuscular dysfunction of bladder, unspecified: Secondary | ICD-10-CM | POA: Diagnosis not present

## 2015-04-01 DIAGNOSIS — Z96649 Presence of unspecified artificial hip joint: Secondary | ICD-10-CM | POA: Diagnosis not present

## 2015-04-01 DIAGNOSIS — N1330 Unspecified hydronephrosis: Secondary | ICD-10-CM | POA: Diagnosis not present

## 2015-04-01 DIAGNOSIS — N390 Urinary tract infection, site not specified: Secondary | ICD-10-CM | POA: Diagnosis not present

## 2015-04-01 DIAGNOSIS — Q059 Spina bifida, unspecified: Secondary | ICD-10-CM | POA: Diagnosis not present

## 2015-04-01 DIAGNOSIS — N3289 Other specified disorders of bladder: Secondary | ICD-10-CM | POA: Diagnosis not present

## 2015-04-01 DIAGNOSIS — Z880 Allergy status to penicillin: Secondary | ICD-10-CM | POA: Diagnosis not present

## 2015-04-06 DIAGNOSIS — Q059 Spina bifida, unspecified: Secondary | ICD-10-CM | POA: Diagnosis not present

## 2015-04-06 DIAGNOSIS — K592 Neurogenic bowel, not elsewhere classified: Secondary | ICD-10-CM | POA: Diagnosis not present

## 2015-04-06 DIAGNOSIS — S8992XS Unspecified injury of left lower leg, sequela: Secondary | ICD-10-CM | POA: Diagnosis not present

## 2015-04-06 DIAGNOSIS — N319 Neuromuscular dysfunction of bladder, unspecified: Secondary | ICD-10-CM | POA: Diagnosis not present

## 2015-04-12 ENCOUNTER — Encounter (HOSPITAL_COMMUNITY): Payer: Self-pay | Admitting: Emergency Medicine

## 2015-04-12 ENCOUNTER — Emergency Department (HOSPITAL_COMMUNITY)
Admission: EM | Admit: 2015-04-12 | Discharge: 2015-04-13 | Disposition: A | Discharge status: Home / Self Care | Payer: Medicare Other | Attending: Emergency Medicine | Admitting: Emergency Medicine

## 2015-04-12 DIAGNOSIS — N390 Urinary tract infection, site not specified: Secondary | ICD-10-CM | POA: Diagnosis not present

## 2015-04-12 DIAGNOSIS — Z91040 Latex allergy status: Secondary | ICD-10-CM | POA: Diagnosis not present

## 2015-04-12 DIAGNOSIS — R1010 Upper abdominal pain, unspecified: Secondary | ICD-10-CM | POA: Diagnosis present

## 2015-04-12 DIAGNOSIS — Z880 Allergy status to penicillin: Secondary | ICD-10-CM | POA: Insufficient documentation

## 2015-04-12 DIAGNOSIS — R1013 Epigastric pain: Secondary | ICD-10-CM | POA: Diagnosis not present

## 2015-04-12 DIAGNOSIS — K5900 Constipation, unspecified: Secondary | ICD-10-CM | POA: Diagnosis not present

## 2015-04-12 DIAGNOSIS — Z8742 Personal history of other diseases of the female genital tract: Secondary | ICD-10-CM | POA: Diagnosis not present

## 2015-04-12 DIAGNOSIS — Z973 Presence of spectacles and contact lenses: Secondary | ICD-10-CM | POA: Insufficient documentation

## 2015-04-12 DIAGNOSIS — Q059 Spina bifida, unspecified: Secondary | ICD-10-CM | POA: Diagnosis not present

## 2015-04-12 DIAGNOSIS — Z79899 Other long term (current) drug therapy: Secondary | ICD-10-CM | POA: Diagnosis not present

## 2015-04-12 DIAGNOSIS — Z791 Long term (current) use of non-steroidal anti-inflammatories (NSAID): Secondary | ICD-10-CM | POA: Insufficient documentation

## 2015-04-12 DIAGNOSIS — D259 Leiomyoma of uterus, unspecified: Secondary | ICD-10-CM | POA: Diagnosis not present

## 2015-04-12 LAB — COMPREHENSIVE METABOLIC PANEL
ALT: 20 U/L (ref 14–54)
AST: 28 U/L (ref 15–41)
Albumin: 3.9 g/dL (ref 3.5–5.0)
Alkaline Phosphatase: 69 U/L (ref 38–126)
Anion gap: 10 (ref 5–15)
BUN: 16 mg/dL (ref 6–20)
CO2: 27 mmol/L (ref 22–32)
Calcium: 9.3 mg/dL (ref 8.9–10.3)
Chloride: 98 mmol/L — ABNORMAL LOW (ref 101–111)
Creatinine, Ser: 0.69 mg/dL (ref 0.44–1.00)
GFR calc Af Amer: >60 mL/min (ref >60)
GFR calc non Af Amer: >60 mL/min (ref >60)
Glucose, Bld: 110 mg/dL — ABNORMAL HIGH (ref 65–99)
Potassium: 3.6 mmol/L (ref 3.5–5.1)
Sodium: 135 mmol/L (ref 135–145)
Total Bilirubin: 0.8 mg/dL (ref 0.3–1.2)
Total Protein: 6.5 g/dL (ref 6.5–8.1)

## 2015-04-12 LAB — CBC
HCT: 35.5 % — ABNORMAL LOW (ref 36.0–46.0)
Hemoglobin: 11.5 g/dL — ABNORMAL LOW (ref 12.0–15.0)
MCH: 29.6 pg (ref 26.0–34.0)
MCHC: 32.4 g/dL (ref 30.0–36.0)
MCV: 91.5 fL (ref 78.0–100.0)
Platelets: 151 10*3/uL (ref 150–400)
RBC: 3.88 MIL/uL (ref 3.87–5.11)
RDW: 13.7 % (ref 11.5–15.5)
WBC: 6.7 10*3/uL (ref 4.0–10.5)

## 2015-04-12 LAB — URINE MICROSCOPIC-ADD ON

## 2015-04-12 LAB — URINALYSIS, ROUTINE W REFLEX MICROSCOPIC
Bilirubin Urine: NEGATIVE
Glucose, UA: NEGATIVE mg/dL
Ketones, ur: NEGATIVE mg/dL
Leukocytes, UA: NEGATIVE
Nitrite: POSITIVE — AB
Protein, ur: NEGATIVE mg/dL
Specific Gravity, Urine: 1.014 (ref 1.005–1.030)
Urobilinogen, UA: 0.2 mg/dL (ref 0.0–1.0)
pH: 6.5 (ref 5.0–8.0)

## 2015-04-12 LAB — LIPASE, BLOOD: Lipase: 28 U/L (ref 22–51)

## 2015-04-12 LAB — TROPONIN I: Troponin I: <0.03 ng/mL (ref <0.031)

## 2015-04-12 MED ORDER — IOHEXOL 300 MG/ML  SOLN
25.0000 mL | INTRAMUSCULAR | Status: AC
  Administered 2015-04-12: 25 mL via ORAL

## 2015-04-12 NOTE — ED Notes (Signed)
C/o burning mid upper abd pain x 2 hours.  Pain worse with palpation.  Denies sob, nausea, vomiting, diarrhea, or constipation.

## 2015-04-13 ENCOUNTER — Encounter: Payer: Medicare Other | Attending: Physical Medicine & Rehabilitation | Admitting: Physical Medicine & Rehabilitation

## 2015-04-13 ENCOUNTER — Emergency Department (HOSPITAL_COMMUNITY): Payer: Medicare Other

## 2015-04-13 ENCOUNTER — Encounter: Payer: Self-pay | Admitting: Physical Medicine & Rehabilitation

## 2015-04-13 VITALS — BP 151/56 | HR 65

## 2015-04-13 DIAGNOSIS — D259 Leiomyoma of uterus, unspecified: Secondary | ICD-10-CM | POA: Diagnosis not present

## 2015-04-13 DIAGNOSIS — N319 Neuromuscular dysfunction of bladder, unspecified: Secondary | ICD-10-CM | POA: Diagnosis not present

## 2015-04-13 DIAGNOSIS — N3946 Mixed incontinence: Secondary | ICD-10-CM | POA: Diagnosis not present

## 2015-04-13 DIAGNOSIS — S8992XS Unspecified injury of left lower leg, sequela: Secondary | ICD-10-CM

## 2015-04-13 DIAGNOSIS — M1712 Unilateral primary osteoarthritis, left knee: Secondary | ICD-10-CM | POA: Diagnosis not present

## 2015-04-13 DIAGNOSIS — Q059 Spina bifida, unspecified: Secondary | ICD-10-CM | POA: Insufficient documentation

## 2015-04-13 DIAGNOSIS — Q057 Lumbar spina bifida without hydrocephalus: Secondary | ICD-10-CM

## 2015-04-13 DIAGNOSIS — N390 Urinary tract infection, site not specified: Secondary | ICD-10-CM | POA: Diagnosis not present

## 2015-04-13 DIAGNOSIS — G8220 Paraplegia, unspecified: Secondary | ICD-10-CM

## 2015-04-13 DIAGNOSIS — G839 Paralytic syndrome, unspecified: Secondary | ICD-10-CM | POA: Diagnosis not present

## 2015-04-13 DIAGNOSIS — N3020 Other chronic cystitis without hematuria: Secondary | ICD-10-CM | POA: Diagnosis not present

## 2015-04-13 MED ORDER — SULFAMETHOXAZOLE-TRIMETHOPRIM 800-160 MG PO TABS: 1.0000 | ORAL_TABLET | Freq: Two times a day (BID) | ORAL | Status: AC

## 2015-04-13 MED ORDER — SULFAMETHOXAZOLE-TRIMETHOPRIM 800-160 MG PO TABS
1.0000 | ORAL_TABLET | Freq: Once | ORAL | Status: AC
  Administered 2015-04-13: 1 via ORAL
  Filled 2015-04-13: qty 1

## 2015-04-13 MED ORDER — DOCUSATE SODIUM 100 MG PO CAPS: 100.0000 mg | ORAL_CAPSULE | Freq: Two times a day (BID) | ORAL | Status: DC

## 2015-04-13 MED ORDER — IOHEXOL 300 MG/ML  SOLN
100.0000 mL | Freq: Once | INTRAMUSCULAR | Status: AC | PRN
  Administered 2015-04-13: 100 mL via INTRAVENOUS

## 2015-04-13 MED ORDER — BUPRENORPHINE 15 MCG/HR TD PTWK: 15.0000 ug | MEDICATED_PATCH | TRANSDERMAL | Status: DC

## 2015-04-13 MED ORDER — OXYCODONE-ACETAMINOPHEN 5-325 MG PO TABS
1.0000 | ORAL_TABLET | Freq: Once | ORAL | Status: AC
  Administered 2015-04-13: 1 via ORAL
  Filled 2015-04-13: qty 1

## 2015-04-13 MED ORDER — POLYETHYLENE GLYCOL 3350 17 G PO PACK: 17.0000 g | PACK | Freq: Every day | ORAL | Status: DC | PRN

## 2015-04-13 MED ORDER — HYDROCODONE-ACETAMINOPHEN 7.5-325 MG PO TABS: 1.0000 | ORAL_TABLET | Freq: Three times a day (TID) | ORAL | Status: DC | PRN

## 2015-04-13 NOTE — ED Provider Notes (Signed)
CSN: [id number]     Arrival date & time 04/12/15  2044 History   First MD Initiated Contact with Patient 04/12/15 2128     Chief Complaint  Patient presents with  . Abdominal Pain     (Consider location/radiation/quality/duration/timing/severity/associated sxs/prior Treatment) HPI Comments: Fronie Holstein is a 49 y.o F with a pmhx of spinabifida who presents to the ED complaining on sudden onset sharp upper abdominal pain that began approximately 3 hours ago. Patient states that she was sitting in her chair began to her brother when she began experiencing acute, sharp upper abdominal pain that feels like "it's going all the way across the top of my stomach". The pain is now constant. Pain does not radiate. Pain is 10/10. Patient has history of ventral hernia that isn't present for several years. Denies dysuria however, patient catheterizes herself daily to urinate. Patient has leg braces on and is unable to walk. Denies nausea, vomiting, diarrhea, foul-smelling urine, fever, chest pain, shortness of breath, vaginal bleeding, vaginal discharge.  Patient is a 49 y.o. female presenting with abdominal pain. The history is provided by the patient.  Abdominal Pain   Past Medical History  Diagnosis Date  . Spina bifida ([place])   . Abnormal Pap smear 2013  . Wears glasses   . Uterine prolapse    History reviewed. No pertinent past surgical history. Family History  Problem Relation Age of Onset  . Hypertension Mother    Social History  Substance Use Topics  . Smoking status: Never Smoker   . Smokeless tobacco: Never Used  . Alcohol Use: No   OB History    Gravida Para Term Preterm AB TAB SAB Ectopic Multiple Living   0 0 0 0 0 0 0 0       Review of Systems  Gastrointestinal: Positive for abdominal pain.  All other systems reviewed and are negative.     Allergies  Avocado; Banana; Latex; and Penicillins  Home Medications   Prior to Admission medications   Medication Sig  Start Date End Date Taking? Authorizing Provider  Buprenorphine (BUTRANS) 15 MCG/HR PTWK Place 15 mcg onto the skin once a week. Patient taking differently: Place 15 mcg onto the skin every Thursday.  03/12/15  Yes Bayard Hugger, NP  Cholecalciferol 1000 UNITS capsule Take 500 Units by mouth daily.   Yes Historical Provider, MD  gabapentin (NEURONTIN) 100 MG capsule Take 300 mg by mouth 3 (three) times daily.  11/18/14  Yes Historical Provider, MD  HYDROcodone-acetaminophen (NORCO) 7.5-325 MG per tablet Take 1 tablet by mouth every 8 (eight) hours as needed for moderate pain. For moderate to sever pain 03/12/15  Yes Bayard Hugger, NP  meloxicam (MOBIC) 7.5 MG tablet Take 1 tablet (7.5 mg total) by mouth daily. 90 day supply 10/24/14  Yes Meredith Staggers, MD  nitrofurantoin (MACRODANTIN) 50 MG capsule Take 50 mg by mouth 1 day or 1 dose.   Yes Historical Provider, MD  oxybutynin (DITROPAN XL) 15 MG 24 hr tablet Take 15 mg by mouth 2 (two) times daily.    Yes Historical Provider, MD   BP 149/69 mmHg  Pulse 60  Temp(Src) 97.5 F (36.4 C) (Oral)  Resp 12  Ht _0  (1.448 m)  Wt 92 lb (41.731 kg)  BMI 19.90 kg/m2  SpO2 100% Physical Exam  Constitutional: She is oriented to person, place, and time. She appears well-developed. No distress.  Patient with severe disability laying in bed. Foley leg braces on. Patient  is disabled to due to spina bifida. Upon entrance to the room patient's mother is attempting to catheterize her daughter with home catheterization kit.  HENT:  Head: Normocephalic and atraumatic.  Mouth/Throat: No oropharyngeal exudate.  Eyes: Conjunctivae and EOM are normal. Pupils are equal, round, and reactive to light. Right eye exhibits no discharge. Left eye exhibits no discharge. No scleral icterus.  Cardiovascular: Normal rate, regular rhythm, normal heart sounds and intact distal pulses.  Exam reveals no gallop and no friction rub.   No murmur heard. Pulmonary/Chest: Effort  normal and breath sounds normal. No respiratory distress. She has no wheezes. She has no rales. She exhibits no tenderness.  Abdominal: Soft. She exhibits no distension. There is tenderness ( Exquisite TTP of RUQ, epigastric, LUQ abdomen.). There is no guarding.  Reducible ventral hernia.  Musculoskeletal: She exhibits no edema or tenderness.  Neurological: She is alert and oriented to person, place, and time.  Strength 5 out of 5 throughout. No sensory deficits.  Skin: Skin is warm and dry. No rash noted. She is not diaphoretic. No erythema. No pallor.  Psychiatric: She has a normal mood and affect.  Nursing note and vitals reviewed.   ED Course  Procedures (including critical care time) Labs Review Labs Reviewed  COMPREHENSIVE METABOLIC PANEL - Abnormal; Notable for the following:    Chloride 98 (*)    Glucose, Bld 110 (*)    All other components within normal limits  CBC - Abnormal; Notable for the following:    Hemoglobin 11.5 (*)    HCT 35.5 (*)    All other components within normal limits  URINALYSIS, ROUTINE W REFLEX MICROSCOPIC (NOT AT [facility]) - Abnormal; Notable for the following:    APPearance CLOUDY (*)    Hgb urine dipstick SMALL (*)    Nitrite POSITIVE (*)    All other components within normal limits  URINE MICROSCOPIC-ADD ON - Abnormal; Notable for the following:    Bacteria, UA MANY (*)    All other components within normal limits  LIPASE, BLOOD  TROPONIN I    Imaging Review No results found. I have personally reviewed and evaluated these images and lab results as part of my medical decision-making.   EKG Interpretation None      MDM   Final diagnoses:  UTI (lower urinary tract infection)  Spina bifida, unspecified hydrocephalus presence, unspecified spinal region [facility])    Patient with history of spina bifida presents with acute onset of upper abdominal pain. Will CT abdomen. Vital signs stable.  Lipase within normal limits. CBC and CMP WNL. UA  reveals positive nitrites and many bacteria. We'll treat with antibiotics.  Pt also states that her last 2 bowel movement were very hard. Will give dulcolax.   Pt signed out to Dr. Doy Mince at shift change. Pending CT imaging. If negative anticipate discharge. Discussed this with pt and pt mother.   Dondra Spry [place], PA-C 04/13/15 0101  Forde Dandy, MD 04/13/15 [phone]

## 2015-04-13 NOTE — ED Provider Notes (Signed)
Care assumed from Dr. Oleta Mouse.  CT showed large amount of stool without obstruction.  Rectal exam did not show impaction, but did show multiple small well formed stool balls.  She then was able to get up and ambulate to the bathroom.  She also has a Urology appointment in the morning.  Stable for dc home.  Clinical Impression: 1. UTI (lower urinary tract infection)   2. Spina bifida, unspecified hydrocephalus presence, unspecified spinal region ([place])   3. Constipation, unspecified constipation type       Serita Grit, MD 04/13/15 [phone]

## 2015-04-13 NOTE — Progress Notes (Signed)
Subjective:    Patient ID: Mary Neal, female    DOB: 09-19-1965, 49 y.o.   MRN: [id number]  HPI   Miyoshi is here in follow up of her chronic left leg pain. She is using a hydrocodone daily for the most part. She will occasionally will use 2 in a day. The butrans patch has been helpful but she has a lot of moments during the day, especially the late afternoon where her pain flares. She hasn't had any recent contact with her orthopedic surgeon.  She was just seen for a urinary tract infection as well as constipation. She was given rx'es for colace and bactrim. She states that her bowels are touchy, and she's afraid to take too much, because she can get severe diarrhea.   Pain Inventory Average Pain 8 Pain Right Now 9 My pain is sharp and stabbing  In the last 24 hours, has pain interfered with the following? General activity 4 Relation with others 1 Enjoyment of life 1 What TIME of day is your pain at its worst? daytime and evening Sleep (in general) NA  Pain is worse with: walking and standing Pain improves with: heat/ice Relief from Meds: 0  Mobility walk with assistance do you drive?  no  Function I need assistance with the following:  bathing, toileting, meal prep, household duties and shopping  Neuro/Psych bladder control problems bowel control problems  Prior Studies Any changes since last visit?  no  Physicians involved in your care Any changes since last visit?  no   Family History  Problem Relation Age of Onset  . Hypertension Mother    Social History   Social History  . Marital Status: Single    Spouse Name: N/A  . Number of Children: N/A  . Years of Education: N/A   Social History Main Topics  . Smoking status: Never Smoker   . Smokeless tobacco: Never Used  . Alcohol Use: No  . Drug Use: No  . Sexual Activity: No   Other Topics Concern  . None   Social History Narrative   ** Merged History Encounter **       History reviewed.  No pertinent past surgical history. Past Medical History  Diagnosis Date  . Spina bifida ([place])   . Abnormal Pap smear 2013  . Wears glasses   . Uterine prolapse    BP 151/56 mmHg  Pulse 65  SpO2 100%  Opioid Risk Score:   Fall Risk Score:  `1  Depression screen PHQ 2/9  Depression screen [facility] 2/9 04/13/2015 03/12/2015 02/13/2015 09/26/2014  Decreased Interest 1 0 1 1  Down, Depressed, Hopeless 1 0 1 1  PHQ - 2 Score 2 0 2 2  Altered sleeping - 1 - 0  Tired, decreased energy - 0 - 1  Change in appetite - 0 - 0  Feeling bad or failure about yourself  - 0 - 0  Trouble concentrating - 0 - 0  Moving slowly or fidgety/restless - 1 - 0  Suicidal thoughts - 0 - 0  PHQ-9 Score - 2 - 3     Review of Systems  Gastrointestinal: Positive for diarrhea and constipation.       Bowel Control Problems  Genitourinary:       Bladder control problems  All other systems reviewed and are negative.      Objective:   Physical Exam  General: Alert and oriented x 3, No apparent distress  HEENT: Head is normocephalic, atraumatic, PERRLA,  EOMI, sclera anicteric, oral mucosa pink and moist, dentition intact, ext ear canals clear,  Neck: Supple without JVD or lymphadenopathy  Heart: Reg rate and rhythm. No murmurs rubs or gallops  Chest: CTA bilaterally without wheezes, rales, or rhonchi; no distress  Abdomen: Soft, non-tender, non-distended, bowel sounds positive.  Extremities: No clubbing, cyanosis, or edema. Pulses are 2+  Skin: Clean and intact without signs of breakdown  Neuro: absent sensation below the lower calf. Profound atrophy of all distal musculature. She has minimal to no hip extension or knee flexion. Cognitively she's generally appropriate, CN notable for dysconjugate gaze. Hearing seems intact.  Musculoskeletal: Likely with complete MCL,ACL, PCL tears. Ongoing internal rotation of the femur with severe valgus deformity at the knee. Has tenderness with PROM and with attempts at AROM .  She did not stand for me today again.  Psych: Pt's affect is appropriate. Pt is cooperative    Assessment & Plan:  1. Spina Bifida with profound distal lower extremity weakness, sensory loss, neurogenic bowel/bladder  2. Left knee pain/ OA with multiple ligament injuries and severe femoral inward rotation and valgus at knee   Plan:  1.Reviewed management of her bowels. She will need to be more aggressive with her prophylaxis even if it means just adjusting her diet. 2. Needs knee fusion surgery at some point for QOL. Advised that she follow up with ortho for further recs. Ultimately the decision is up to her and mom 3. Continue low dose hydrocodone for more severe pain. Encouraged her to use it twice daily if needed. 4. Continue 15mg  butrans patch for pain relief--we discussed alternatives and positives/negatives. 5. Activity modfication, more wheelchair use, less walking    Follow up with  NP in about 1 month. 15 minutes of face to face patient care time were spent during this visit. All questions were encouraged and answered.

## 2015-04-13 NOTE — Patient Instructions (Signed)
PLEASE CALL ME WITH ANY PROBLEMS OR QUESTIONS (#[phone]).  HAVE A GOOD DAY!    

## 2015-04-16 ENCOUNTER — Ambulatory Visit (INDEPENDENT_AMBULATORY_CARE_PROVIDER_SITE_OTHER): Payer: Medicare Other | Admitting: Podiatry

## 2015-04-16 DIAGNOSIS — L89891 Pressure ulcer of other site, stage 1: Secondary | ICD-10-CM

## 2015-04-16 DIAGNOSIS — M79674 Pain in right toe(s): Secondary | ICD-10-CM | POA: Diagnosis not present

## 2015-04-16 DIAGNOSIS — M79675 Pain in left toe(s): Secondary | ICD-10-CM

## 2015-04-16 DIAGNOSIS — L97421 Non-pressure chronic ulcer of left heel and midfoot limited to breakdown of skin: Secondary | ICD-10-CM

## 2015-04-16 DIAGNOSIS — B351 Tinea unguium: Secondary | ICD-10-CM | POA: Diagnosis not present

## 2015-04-16 NOTE — Progress Notes (Signed)
Subjective:     Patient ID: Mary Neal, female   DOB: 21-Jun-1966, 49 y.o.   MRN: [id number]  HPIThis patient presents to the office with pain noted on the inside of her left foot.  She was seen initially years ago and we closed her ulcer on the left foot.  This patient has spina bifida and walks with braces.  She has recently been evaluated for knee problems left leg.She says her ulcer is becoming more painful and her callus top has thickened.   Review of Systems     Objective:   Physical Exam Objective: Review of past medical history, medications, social history and allergies were performed.  Vascular: Dorsalis pedis and posterior tibial pulses were absent  B/L, capillary refill was  WNL B/L, temperature gradient was diminished revealing cold feet   Skin:  There is a hyperkeratotic roof over her ulcer on medial aspect left foot over the navicular articula.  No signs of redness infection or drainage.tions  Nails: thick disfigured discolored hallux toenails B/L  Sensory: Semmes Weinstein monifilament Absent     Assessment:     Ulcer left foot  Onychomycosis    Plan:     Debridement of ulcer left foot.    Debridement of onychomycotic nails

## 2015-04-27 DIAGNOSIS — R202 Paresthesia of skin: Secondary | ICD-10-CM | POA: Diagnosis not present

## 2015-04-27 DIAGNOSIS — Z23 Encounter for immunization: Secondary | ICD-10-CM | POA: Diagnosis not present

## 2015-04-27 DIAGNOSIS — B001 Herpesviral vesicular dermatitis: Secondary | ICD-10-CM | POA: Diagnosis not present

## 2015-04-27 DIAGNOSIS — R350 Frequency of micturition: Secondary | ICD-10-CM | POA: Diagnosis not present

## 2015-05-12 DIAGNOSIS — K5900 Constipation, unspecified: Secondary | ICD-10-CM | POA: Diagnosis not present

## 2015-05-12 DIAGNOSIS — Z1211 Encounter for screening for malignant neoplasm of colon: Secondary | ICD-10-CM | POA: Diagnosis not present

## 2015-05-12 DIAGNOSIS — M79605 Pain in left leg: Secondary | ICD-10-CM | POA: Diagnosis not present

## 2015-05-15 ENCOUNTER — Other Ambulatory Visit: Payer: Self-pay | Admitting: Registered Nurse

## 2015-05-15 ENCOUNTER — Encounter: Payer: Self-pay | Admitting: Registered Nurse

## 2015-05-15 ENCOUNTER — Encounter: Payer: Medicare Other | Attending: Physical Medicine & Rehabilitation | Admitting: Registered Nurse

## 2015-05-15 VITALS — BP 148/74 | HR 81

## 2015-05-15 DIAGNOSIS — M1712 Unilateral primary osteoarthritis, left knee: Secondary | ICD-10-CM | POA: Diagnosis not present

## 2015-05-15 DIAGNOSIS — S8992XS Unspecified injury of left lower leg, sequela: Secondary | ICD-10-CM | POA: Diagnosis not present

## 2015-05-15 DIAGNOSIS — Q059 Spina bifida, unspecified: Secondary | ICD-10-CM | POA: Insufficient documentation

## 2015-05-15 DIAGNOSIS — G8220 Paraplegia, unspecified: Secondary | ICD-10-CM

## 2015-05-15 DIAGNOSIS — Q057 Lumbar spina bifida without hydrocephalus: Secondary | ICD-10-CM

## 2015-05-15 DIAGNOSIS — Z79899 Other long term (current) drug therapy: Secondary | ICD-10-CM

## 2015-05-15 DIAGNOSIS — G894 Chronic pain syndrome: Secondary | ICD-10-CM | POA: Diagnosis not present

## 2015-05-15 DIAGNOSIS — Z5181 Encounter for therapeutic drug level monitoring: Secondary | ICD-10-CM | POA: Diagnosis not present

## 2015-05-15 DIAGNOSIS — M179 Osteoarthritis of knee, unspecified: Secondary | ICD-10-CM | POA: Diagnosis not present

## 2015-05-15 DIAGNOSIS — G839 Paralytic syndrome, unspecified: Secondary | ICD-10-CM | POA: Insufficient documentation

## 2015-05-15 MED ORDER — BUPRENORPHINE 15 MCG/HR TD PTWK: 15.0000 ug | MEDICATED_PATCH | TRANSDERMAL | Status: DC

## 2015-05-15 MED ORDER — HYDROCODONE-ACETAMINOPHEN 7.5-325 MG PO TABS: 1.0000 | ORAL_TABLET | Freq: Three times a day (TID) | ORAL | Status: DC | PRN

## 2015-05-15 NOTE — Progress Notes (Signed)
Subjective:    Patient ID: Mary Neal, female    DOB: 1966/04/12, 49 y.o.   MRN: [id number]  HPI: [name] is a 49 year old female who returns for follow up for chronic pain and medication refill. She says her pain is located in her left knee.  She didnot rate her pain . Her current exercise regime is walking short distances using loftstrand crutches. Usually in her wheelchair Mother in room all questions answered.  Pain Inventory Average Pain 9 Pain Right Now NA My pain is sharp, stabbing and aching  In the last 24 hours, has pain interfered with the following? General activity 4 Relation with others 0 Enjoyment of life 0 What TIME of day is your pain at its worst? All the time Sleep (in general) Fair  Pain is worse with: walking Pain improves with: heat/ice Relief from Meds: 0  Mobility walk with assistance do you drive?  no use a wheelchair needs help with transfers  Function Do you have any goals in this area?  no  Neuro/Psych bladder control problems bowel control problems  Prior Studies Any changes since last visit?  no  Physicians involved in your care Any changes since last visit?  no   Family History  Problem Relation Age of Onset  . Hypertension Mother    Social History   Social History  . Marital Status: Single    Spouse Name: N/A  . Number of Children: N/A  . Years of Education: N/A   Social History Main Topics  . Smoking status: Never Smoker   . Smokeless tobacco: Never Used  . Alcohol Use: No  . Drug Use: No  . Sexual Activity: No   Other Topics Concern  . None   Social History Narrative   ** Merged History Encounter **       History reviewed. No pertinent past surgical history. Past Medical History  Diagnosis Date  . Spina bifida ([place])   . Abnormal Pap smear 2013  . Wears glasses   . Uterine prolapse    BP 148/74 mmHg  Pulse 81  SpO2 100%  Opioid Risk Score:   Fall Risk Score:  `1  Depression  screen PHQ 2/9  Depression screen [facility] 2/9 04/13/2015 03/12/2015 02/13/2015 09/26/2014  Decreased Interest 1 0 1 1  Down, Depressed, Hopeless 1 0 1 1  PHQ - 2 Score 2 0 2 2  Altered sleeping - 1 - 0  Tired, decreased energy - 0 - 1  Change in appetite - 0 - 0  Feeling bad or failure about yourself  - 0 - 0  Trouble concentrating - 0 - 0  Moving slowly or fidgety/restless - 1 - 0  Suicidal thoughts - 0 - 0  PHQ-9 Score - 2 - 3     Review of Systems  Gastrointestinal: Positive for constipation.       Bowel control problems  Genitourinary:       Bladder control problems   Neurological:       Gait instability  All other systems reviewed and are negative.      Objective:   Physical Exam  Constitutional: She is oriented to person, place, and time. She appears well-developed and well-nourished.  HENT:  Head: Normocephalic and atraumatic.  Neck: Normal range of motion. Neck supple.  Cardiovascular: Normal rate and regular rhythm.   Pulmonary/Chest: Effort normal and breath sounds normal.  Musculoskeletal:  Normal Muscle Bulk and Muscle Testing Reveals: Upper Extremities:  Full ROM and Muscle Strength 5/5 Lower Extremities: Decreased ROM and Muscle Strength 4/5 Bilateral Braces Intact to Lower Extremities Arrived in wheelchair  Neurological: She is alert and oriented to person, place, and time.  Skin: Skin is warm and dry.  Psychiatric: She has a normal mood and affect.  Nursing note and vitals reviewed.         Assessment & Plan:  1. Spina Bifida with profound distal lower extremity weakness, sensory loss, neurogenic bowel/bladder . Continue with Bowel and Bladder Program 2. Left knee pain/ OA with multiple ligament injuries and severe femoral inward rotation and valgus at knee: Ortho Following Refilled: Butran 43mcg/hr weekly #4 and Hydrocodone 7.5/325 mg one tablet every 8 hours as needed for moderate to sever pain #55  3. Edema: Continue Compression Stockiings.  15-20 4. Activity modfication, more wheelchair use, less walking was re-iterated.   20 minutes of face to face patient care time was spent during this visit. All questions were encouraged and answered.   F/U with Dr. Naaman Plummer

## 2015-05-16 LAB — PMP ALCOHOL METABOLITE (ETG): Ethyl Glucuronide (EtG): NEGATIVE ng/mL

## 2015-05-20 DIAGNOSIS — Z1211 Encounter for screening for malignant neoplasm of colon: Secondary | ICD-10-CM | POA: Diagnosis not present

## 2015-05-20 DIAGNOSIS — D123 Benign neoplasm of transverse colon: Secondary | ICD-10-CM | POA: Diagnosis not present

## 2015-05-20 DIAGNOSIS — D122 Benign neoplasm of ascending colon: Secondary | ICD-10-CM | POA: Diagnosis not present

## 2015-05-20 DIAGNOSIS — K635 Polyp of colon: Secondary | ICD-10-CM | POA: Diagnosis not present

## 2015-05-21 LAB — OPIATES/OPIOIDS (LC/MS-MS)
Codeine Urine: NEGATIVE ng/mL (ref <50)
Hydrocodone: 752 ng/mL (ref <50)
Hydromorphone: 117 ng/mL (ref <50)
Morphine Urine: NEGATIVE ng/mL (ref <50)
Norhydrocodone, Ur: 4138 ng/mL (ref <50)
Noroxycodone, Ur: NEGATIVE ng/mL (ref <50)
Oxycodone, ur: NEGATIVE ng/mL (ref <50)
Oxymorphone: NEGATIVE ng/mL (ref <50)

## 2015-05-22 LAB — PRESCRIPTION MONITORING PROFILE (SOLSTAS)
Amphetamine/Meth: NEGATIVE ng/mL
Barbiturate Screen, Urine: NEGATIVE ng/mL
Benzodiazepine Screen, Urine: NEGATIVE ng/mL
Buprenorphine, Urine: NEGATIVE ng/mL
Cannabinoid Scrn, Ur: NEGATIVE ng/mL
Carisoprodol, Urine: NEGATIVE ng/mL
Cocaine Metabolites: NEGATIVE ng/mL
Creatinine, Urine: 78.24 mg/dL (ref >20.0)
Fentanyl, Ur: NEGATIVE ng/mL
MDMA URINE: NEGATIVE ng/mL
Meperidine, Ur: NEGATIVE ng/mL
Methadone Screen, Urine: NEGATIVE ng/mL
Nitrites, Initial: NEGATIVE ug/mL
Oxycodone Screen, Ur: NEGATIVE ng/mL
Propoxyphene: NEGATIVE ng/mL
Tapentadol, urine: NEGATIVE ng/mL
Tramadol Scrn, Ur: NEGATIVE ng/mL
Zolpidem, Urine: NEGATIVE ng/mL
pH, Initial: 6.8 pH (ref 4.5–8.9)

## 2015-05-25 ENCOUNTER — Telehealth: Payer: Self-pay | Admitting: Physical Medicine & Rehabilitation

## 2015-05-25 DIAGNOSIS — M1712 Unilateral primary osteoarthritis, left knee: Secondary | ICD-10-CM

## 2015-05-25 DIAGNOSIS — Q059 Spina bifida, unspecified: Secondary | ICD-10-CM

## 2015-05-25 DIAGNOSIS — G8220 Paraplegia, unspecified: Secondary | ICD-10-CM

## 2015-05-25 DIAGNOSIS — S8992XA Unspecified injury of left lower leg, initial encounter: Secondary | ICD-10-CM

## 2015-05-25 NOTE — Telephone Encounter (Signed)
Spoke with Dr. Naaman Plummer, We will place a referral to Dr. Jean Rosenthal.  I called Ms. Rosch she agrees with plan and verbalizes understanding.

## 2015-05-25 NOTE — Telephone Encounter (Signed)
Patient's mother would like for [name] to call her about the orthopedic surgeon.  She has not found one and would like to know if [facility] or Dr. Naaman Plummer would have any recommendations who she could call.

## 2015-05-25 NOTE — Telephone Encounter (Signed)
I spoke with Mary Neal today. During her visit on 05/15/2015 she wanted to ask her daughter in law about and Human resources officer. She is now requesting Dr. Naaman Plummer opinion. Will speak to Dr. Naaman Plummer and give her a call she verbalizes understanding.

## 2015-05-27 NOTE — Progress Notes (Signed)
Urine drug screen for this encounter is consistent for prescribed medication 

## 2015-06-01 ENCOUNTER — Other Ambulatory Visit: Payer: Self-pay

## 2015-06-01 MED ORDER — GABAPENTIN 100 MG PO CAPS: 100.0000 mg | ORAL_CAPSULE | Freq: Three times a day (TID) | ORAL | Status: DC

## 2015-06-02 NOTE — Telephone Encounter (Signed)
Patient mother called regarding gabapentin rx.  She says the patients dose was increased and when she went to the pharmacy to refill they would not do so until December.  Please advise.

## 2015-06-03 MED ORDER — GABAPENTIN 300 MG PO CAPS: 300.0000 mg | ORAL_CAPSULE | Freq: Three times a day (TID) | ORAL | Status: DC

## 2015-06-03 NOTE — Telephone Encounter (Signed)
Spoke with Ms. Northrop mother,  I increased Ms. Much Gabapentin in September 2016 to 300  Mg TID, she has tolerated this change. She has been taken Gabapentin 100 mg capsules three  Capsules three times a day.Order sent to [facility]. Gabapentin 300 mg TID also Gabapentin 100 mg discontinued. I spoke with Pharmacist at [facility] regarding the above. Ms. Clevinger aware and verbalizes understanding.

## 2015-06-03 NOTE — Telephone Encounter (Signed)
Patient's mother is stating that her pharmacy will not fill her medication until December 5th. Please call patient's mother back she needs to know what to do.

## 2015-06-08 DIAGNOSIS — M1712 Unilateral primary osteoarthritis, left knee: Secondary | ICD-10-CM | POA: Diagnosis not present

## 2015-06-12 ENCOUNTER — Encounter: Payer: Self-pay | Admitting: Registered Nurse

## 2015-06-12 ENCOUNTER — Encounter: Payer: Medicare Other | Attending: Physical Medicine & Rehabilitation | Admitting: Registered Nurse

## 2015-06-12 VITALS — BP 151/82 | HR 74

## 2015-06-12 DIAGNOSIS — G839 Paralytic syndrome, unspecified: Secondary | ICD-10-CM | POA: Diagnosis not present

## 2015-06-12 DIAGNOSIS — M179 Osteoarthritis of knee, unspecified: Secondary | ICD-10-CM

## 2015-06-12 DIAGNOSIS — Q059 Spina bifida, unspecified: Secondary | ICD-10-CM

## 2015-06-12 DIAGNOSIS — Z79899 Other long term (current) drug therapy: Secondary | ICD-10-CM

## 2015-06-12 DIAGNOSIS — Z5181 Encounter for therapeutic drug level monitoring: Secondary | ICD-10-CM

## 2015-06-12 DIAGNOSIS — S8992XS Unspecified injury of left lower leg, sequela: Secondary | ICD-10-CM

## 2015-06-12 DIAGNOSIS — G8220 Paraplegia, unspecified: Secondary | ICD-10-CM | POA: Diagnosis not present

## 2015-06-12 DIAGNOSIS — M1712 Unilateral primary osteoarthritis, left knee: Secondary | ICD-10-CM | POA: Diagnosis not present

## 2015-06-12 DIAGNOSIS — Q057 Lumbar spina bifida without hydrocephalus: Secondary | ICD-10-CM

## 2015-06-12 MED ORDER — HYDROCODONE-ACETAMINOPHEN 7.5-325 MG PO TABS: 1.0000 | ORAL_TABLET | Freq: Three times a day (TID) | ORAL | Status: DC | PRN

## 2015-06-12 NOTE — Progress Notes (Signed)
Subjective:    Patient ID: Mary Neal, female    DOB: [birth year], 49 y.o.   MRN: [id number]  HPI: [name] is a 49 year old female who returns for follow up for chronic pain and medication refill. She says her pain is located in her left lower extremity. She rates her pain 6 . Her current exercise regime is walking short distances using loftstrand crutches. Usually in her wheelchair Mother in room all questions answered. Also states she seen Dr. Rush Farmer on 11/ 28/2016 , she received a cortisone injection in her left knee with some relief. Dr. Rush Farmer referred her to [facility] she's waiting for an appointment.   Pain Inventory Average Pain 6 Pain Right Now 6 My pain is sharp and stabbing  In the last 24 hours, has pain interfered with the following? General activity NA Relation with others NA Enjoyment of life NA What TIME of day is your pain at its worst? Morning, Daytime, Evening and Night Sleep (in general) Good  Pain is worse with: walking, bending, sitting, inactivity, standing and some activites Pain improves with: heat/ice Relief from Meds: NA  Mobility walk with assistance do you drive?  no use a wheelchair transfers alone Do you have any goals in this area?  no  Function Do you have any goals in this area?  no  Neuro/Psych bladder control problems bowel control problems  Prior Studies Any changes since last visit?  no  Physicians involved in your care Any changes since last visit?  no   Family History  Problem Relation Age of Onset  . Hypertension Mother    Social History   Social History  . Marital Status: Single    Spouse Name: N/A  . Number of Children: N/A  . Years of Education: N/A   Social History Main Topics  . Smoking status: Never Smoker   . Smokeless tobacco: Never Used  . Alcohol Use: No  . Drug Use: No  . Sexual Activity: No   Other Topics Concern  . None   Social History  Narrative   ** Merged History Encounter **       History reviewed. No pertinent past surgical history. Past Medical History  Diagnosis Date  . Spina bifida ([place])   . Abnormal Pap smear 2013  . Wears glasses   . Uterine prolapse    BP 151/82 mmHg  Pulse 74  SpO2 100%  Opioid Risk Score:   Fall Risk Score:  `1  Depression screen PHQ 2/9  Depression screen [facility] 2/9 04/13/2015 03/12/2015 02/13/2015 09/26/2014  Decreased Interest 1 0 1 1  Down, Depressed, Hopeless 1 0 1 1  PHQ - 2 Score 2 0 2 2  Altered sleeping - 1 - 0  Tired, decreased energy - 0 - 1  Change in appetite - 0 - 0  Feeling bad or failure about yourself  - 0 - 0  Trouble concentrating - 0 - 0  Moving slowly or fidgety/restless - 1 - 0  Suicidal thoughts - 0 - 0  PHQ-9 Score - 2 - 3     Review of Systems  Gastrointestinal: Positive for diarrhea and constipation.       Bowel Control Problems  Genitourinary:       Bladder Control Problems  All other systems reviewed and are negative.      Objective:   Physical Exam  Constitutional: She appears well-developed and well-nourished.  HENT:  Head: Normocephalic and  atraumatic.  Musculoskeletal:  Normal Muscle Bulk and Muscle testing Reveals: Upper Extremities: Full ROM and Muscle Strength 5/5 Lower Extremities: Right: Full ROM and Muscle Strength 4/5 Left: Decreased ROM Bilateral Braces intact/ Arrived in wheelchair   Nursing note and vitals reviewed.         Assessment & Plan:  1. Spina Bifida with profound distal lower extremity weakness, sensory loss, neurogenic bowel/bladder . Continue with Bowel and Bladder Program 2. Left knee pain/ OA with multiple ligament injuries and severe femoral inward rotation and valgus at knee: Ortho Following Continue: Butran 31mcg/hr weekly #4 and Refilled: Hydrocodone 7.5/325 mg one tablet every 8 hours as needed for moderate to sever pain #55  3. Edema: Continue Compression Stockiings. 15-20 4. Activity  modfication, more wheelchair use, less walking was re-iterated.   20 minutes of face to face patient care time was spent during this visit. All questions were encouraged and answered.

## 2015-06-23 DIAGNOSIS — Z1231 Encounter for screening mammogram for malignant neoplasm of breast: Secondary | ICD-10-CM | POA: Diagnosis not present

## 2015-07-14 ENCOUNTER — Other Ambulatory Visit: Payer: Self-pay | Admitting: Registered Nurse

## 2015-07-17 ENCOUNTER — Ambulatory Visit: Payer: Medicare Other | Admitting: Podiatry

## 2015-07-22 ENCOUNTER — Encounter: Payer: Self-pay | Admitting: Podiatry

## 2015-07-22 ENCOUNTER — Ambulatory Visit (INDEPENDENT_AMBULATORY_CARE_PROVIDER_SITE_OTHER): Payer: Medicare Other | Admitting: Podiatry

## 2015-07-22 ENCOUNTER — Ambulatory Visit: Payer: Medicare Other | Admitting: Podiatry

## 2015-07-22 DIAGNOSIS — M79675 Pain in left toe(s): Secondary | ICD-10-CM

## 2015-07-22 DIAGNOSIS — M79676 Pain in unspecified toe(s): Secondary | ICD-10-CM | POA: Diagnosis not present

## 2015-07-22 DIAGNOSIS — M79674 Pain in right toe(s): Secondary | ICD-10-CM

## 2015-07-22 DIAGNOSIS — B351 Tinea unguium: Secondary | ICD-10-CM

## 2015-07-22 DIAGNOSIS — L84 Corns and callosities: Secondary | ICD-10-CM | POA: Diagnosis not present

## 2015-07-22 DIAGNOSIS — L97421 Non-pressure chronic ulcer of left heel and midfoot limited to breakdown of skin: Secondary | ICD-10-CM

## 2015-07-22 NOTE — Progress Notes (Signed)
Subjective:     Patient ID: Mary Neal, female   DOB: 05-Aug-1965, 50 y.o.   MRN: [id number]  HPIThis patient presents to the office with pain noted on the inside of her left foot.  She was seen initially years ago and we closed her ulcer on the left foot.  This patient has spina bifida and walks with braces.  She has recently been evaluated for knee problems left leg.She says her ulcer is becoming more painful and her callus top has thickened.   Review of Systems     Objective:   Physical Exam Objective: Review of past medical history, medications, social history and allergies were performed.  Vascular: Dorsalis pedis and posterior tibial pulses were absent  B/L, capillary refill was  WNL B/L, temperature gradient was diminished revealing cold feet   Skin:  There is a hyperkeratotic roof over her ulcer on medial aspect left foot over the navicular articula.  No signs of redness infection or drainage.tions  Nails: thick disfigured discolored hallux toenails B/L  Sensory: Semmes Weinstein monifilament Absent     Assessment:     Ulcer left foot  Onychomycosis    Plan:     Debridement of ulcer left foot.    Debridement of onychomycotic nails.  RTC 10 weeks   Gardiner Barefoot DPM

## 2015-08-03 ENCOUNTER — Encounter: Payer: Medicare Other | Admitting: Registered Nurse

## 2015-08-05 DIAGNOSIS — G8220 Paraplegia, unspecified: Secondary | ICD-10-CM | POA: Diagnosis not present

## 2015-08-05 DIAGNOSIS — M79605 Pain in left leg: Secondary | ICD-10-CM | POA: Diagnosis not present

## 2015-08-05 DIAGNOSIS — M25562 Pain in left knee: Secondary | ICD-10-CM | POA: Diagnosis not present

## 2015-08-05 DIAGNOSIS — Q057 Lumbar spina bifida without hydrocephalus: Secondary | ICD-10-CM | POA: Diagnosis not present

## 2015-08-05 DIAGNOSIS — S83105A Unspecified dislocation of left knee, initial encounter: Secondary | ICD-10-CM | POA: Diagnosis not present

## 2015-08-07 ENCOUNTER — Encounter: Payer: Self-pay | Admitting: Registered Nurse

## 2015-08-07 ENCOUNTER — Encounter: Payer: Medicare Other | Attending: Physical Medicine & Rehabilitation | Admitting: Registered Nurse

## 2015-08-07 VITALS — BP 130/52 | HR 75 | Resp 14

## 2015-08-07 DIAGNOSIS — G8220 Paraplegia, unspecified: Secondary | ICD-10-CM

## 2015-08-07 DIAGNOSIS — M1712 Unilateral primary osteoarthritis, left knee: Secondary | ICD-10-CM | POA: Diagnosis not present

## 2015-08-07 DIAGNOSIS — Q059 Spina bifida, unspecified: Secondary | ICD-10-CM | POA: Diagnosis not present

## 2015-08-07 DIAGNOSIS — G839 Paralytic syndrome, unspecified: Secondary | ICD-10-CM | POA: Insufficient documentation

## 2015-08-07 DIAGNOSIS — Z5181 Encounter for therapeutic drug level monitoring: Secondary | ICD-10-CM

## 2015-08-07 DIAGNOSIS — M179 Osteoarthritis of knee, unspecified: Secondary | ICD-10-CM | POA: Diagnosis not present

## 2015-08-07 DIAGNOSIS — Q057 Lumbar spina bifida without hydrocephalus: Secondary | ICD-10-CM

## 2015-08-07 DIAGNOSIS — Z79899 Other long term (current) drug therapy: Secondary | ICD-10-CM

## 2015-08-07 DIAGNOSIS — S8992XS Unspecified injury of left lower leg, sequela: Secondary | ICD-10-CM

## 2015-08-07 MED ORDER — HYDROCODONE-ACETAMINOPHEN 7.5-325 MG PO TABS: 1.0000 | ORAL_TABLET | Freq: Three times a day (TID) | ORAL | Status: DC | PRN

## 2015-08-07 MED ORDER — BUPRENORPHINE 15 MCG/HR TD PTWK: MEDICATED_PATCH | TRANSDERMAL | Status: DC

## 2015-08-07 NOTE — Progress Notes (Signed)
Subjective:    Patient ID: Mary Neal, female    DOB: [birth year], 50 y.o.   MRN: [id number]  HPI: Mary Neal is a 50 year old female who returns for follow up for chronic pain and medication refill. She says her pain is located in her left knee and left lower extremity. She rates her pain 8 . Her current exercise regime is performing stretching exercises. Usually in her wheelchair Dr. Rush Farmer referred her to Dr. Redmond Pulling from [facility] her appointment was 08/05/2015 she has another appointment on 08/11/2015. States she will be having a left knee replacement in February. Instructed to call office with details she verbalizes understanding. Mother in room all questions answered.   Pain Inventory Average Pain 8 Pain Right Now 8 My pain is sharp and dull  In the last 24 hours, has pain interfered with the following? General activity 2 Relation with others 0 Enjoyment of life 0 What TIME of day is your pain at its worst? all Sleep (in general) Good  Pain is worse with: walking, inactivity, standing and some activites Pain improves with: medication Relief from Meds: 4  Mobility walk with assistance ability to climb steps?  no do you drive?  no use a wheelchair  Function disabled: date disabled . I need assistance with the following:  bathing, meal prep and household duties  Neuro/Psych bladder control problems bowel control problems  Prior Studies Any changes since last visit?  yes x-rays  Physicians involved in your care Any changes since last visit?  no   Family History  Problem Relation Age of Onset  . Hypertension Mother    Social History   Social History  . Marital Status: Single    Spouse Name: N/A  . Number of Children: N/A  . Years of Education: N/A   Social History Main Topics  . Smoking status: Never Smoker   . Smokeless tobacco: Never Used  . Alcohol Use: No  . Drug Use: No  . Sexual Activity: No    Other Topics Concern  . None   Social History Narrative   ** Merged History Encounter **       History reviewed. No pertinent past surgical history. Past Medical History  Diagnosis Date  . Spina bifida ([place])   . Abnormal Pap smear 2013  . Wears glasses   . Uterine prolapse    BP 130/52 mmHg  Pulse 75  Resp 14  SpO2 100%  Opioid Risk Score:   Fall Risk Score:  `1  Depression screen PHQ 2/9  Depression screen [facility] 2/9 04/13/2015 03/12/2015 02/13/2015 09/26/2014  Decreased Interest 1 0 1 1  Down, Depressed, Hopeless 1 0 1 1  PHQ - 2 Score 2 0 2 2  Altered sleeping - 1 - 0  Tired, decreased energy - 0 - 1  Change in appetite - 0 - 0  Feeling bad or failure about yourself  - 0 - 0  Trouble concentrating - 0 - 0  Moving slowly or fidgety/restless - 1 - 0  Suicidal thoughts - 0 - 0  PHQ-9 Score - 2 - 3     Review of Systems  Gastrointestinal: Positive for diarrhea and constipation.  All other systems reviewed and are negative.      Objective:   Physical Exam  Constitutional: She is oriented to person, place, and time. She appears well-developed and well-nourished.  HENT:  Head: Normocephalic and atraumatic.  Neck: Normal range of motion.  Neck supple.  Cardiovascular: Normal rate and regular rhythm.   Pulmonary/Chest: Effort normal and breath sounds normal.  Musculoskeletal:  Normal Muscle Bulk and Muscle Testing Reveals: Upper Extremities: Full ROM and Muscle Strength 5/5 Lower Extremities: Right: Full ROM and Muscle Strength 5/5 Left: Decreased ROM and Muscle Strength 4/5 Left Lower Extremity Flexion Produces Pain into Patella Bilateral Braces Intact Arrived in Wheelchair  Neurological: She is alert and oriented to person, place, and time.  Skin: Skin is warm and dry.  Psychiatric: She has a normal mood and affect.  Nursing note and vitals reviewed.         Assessment & Plan:  1. Spina Bifida with profound distal lower extremity weakness, sensory loss,  neurogenic bowel/bladder . Continue with Bowel and Bladder Program 2. Left knee pain/ OA with multiple ligament injuries and severe femoral inward rotation and valgus at knee: Ortho Following Continue: Butran 42mcg/hr weekly #4 and Refilled: Hydrocodone 7.5/325 mg one tablet every 8 hours as needed for moderate to sever pain #55  3. Edema: Continue Compression Stockiings. 15-20 4. Activity modfication, more wheelchair use, less walking was re-iterated.   20 minutes of face to face patient care time was spent during this visit. All questions were encouraged and answered.

## 2015-08-11 DIAGNOSIS — Z7189 Other specified counseling: Secondary | ICD-10-CM | POA: Diagnosis not present

## 2015-08-11 DIAGNOSIS — M25461 Effusion, right knee: Secondary | ICD-10-CM | POA: Diagnosis not present

## 2015-08-11 DIAGNOSIS — S83115D Anterior dislocation of proximal end of tibia, left knee, subsequent encounter: Secondary | ICD-10-CM | POA: Diagnosis not present

## 2015-08-11 DIAGNOSIS — M1990 Unspecified osteoarthritis, unspecified site: Secondary | ICD-10-CM | POA: Diagnosis not present

## 2015-08-11 DIAGNOSIS — S83105D Unspecified dislocation of left knee, subsequent encounter: Secondary | ICD-10-CM | POA: Diagnosis not present

## 2015-08-11 DIAGNOSIS — G8929 Other chronic pain: Secondary | ICD-10-CM | POA: Diagnosis not present

## 2015-08-11 DIAGNOSIS — M25562 Pain in left knee: Secondary | ICD-10-CM | POA: Diagnosis not present

## 2015-08-11 DIAGNOSIS — M25862 Other specified joint disorders, left knee: Secondary | ICD-10-CM | POA: Diagnosis not present

## 2015-08-11 DIAGNOSIS — M8580 Other specified disorders of bone density and structure, unspecified site: Secondary | ICD-10-CM | POA: Diagnosis not present

## 2015-08-12 DIAGNOSIS — S83105D Unspecified dislocation of left knee, subsequent encounter: Secondary | ICD-10-CM | POA: Diagnosis not present

## 2015-08-12 DIAGNOSIS — G8929 Other chronic pain: Secondary | ICD-10-CM | POA: Diagnosis not present

## 2015-08-12 DIAGNOSIS — M8580 Other specified disorders of bone density and structure, unspecified site: Secondary | ICD-10-CM | POA: Diagnosis not present

## 2015-08-12 DIAGNOSIS — Z7189 Other specified counseling: Secondary | ICD-10-CM | POA: Diagnosis not present

## 2015-08-12 DIAGNOSIS — M25862 Other specified joint disorders, left knee: Secondary | ICD-10-CM | POA: Diagnosis not present

## 2015-08-12 DIAGNOSIS — M25461 Effusion, right knee: Secondary | ICD-10-CM | POA: Diagnosis not present

## 2015-08-17 ENCOUNTER — Telehealth: Payer: Self-pay | Admitting: *Deleted

## 2015-08-17 NOTE — Telephone Encounter (Signed)
Shere's mother [name] calling to speak with [name] about Trezure's medications.  She would like to speak with [name].

## 2015-08-17 NOTE — Telephone Encounter (Signed)
I spoke with [name] mother, she wanted to let the office  know her daughter is scheduled for Left Knee replacement surgery on 08/24/2015 at [facility].

## 2015-08-18 DIAGNOSIS — G8220 Paraplegia, unspecified: Secondary | ICD-10-CM | POA: Diagnosis not present

## 2015-08-18 DIAGNOSIS — S83105D Unspecified dislocation of left knee, subsequent encounter: Secondary | ICD-10-CM | POA: Diagnosis not present

## 2015-08-18 DIAGNOSIS — M179 Osteoarthritis of knee, unspecified: Secondary | ICD-10-CM | POA: Diagnosis not present

## 2015-08-18 DIAGNOSIS — Q057 Lumbar spina bifida without hydrocephalus: Secondary | ICD-10-CM | POA: Diagnosis not present

## 2015-08-18 DIAGNOSIS — Z01818 Encounter for other preprocedural examination: Secondary | ICD-10-CM | POA: Diagnosis not present

## 2015-08-18 DIAGNOSIS — M129 Arthropathy, unspecified: Secondary | ICD-10-CM | POA: Diagnosis not present

## 2015-08-19 ENCOUNTER — Telehealth: Payer: Self-pay | Admitting: *Deleted

## 2015-08-19 NOTE — Telephone Encounter (Signed)
Pt's mother called, Patient is having knee replacement on 08/24/2015.  Pt was advised to get a walker prior to surgery. Pt has medicare and medicaid.  Mother is wondering how she is supposed to make that happen?   I consulted Dr. Naaman Plummer face to face and he said a walker will be provided by the hospital on discharge.  I contacted the pt's mother and informed.

## 2015-08-20 DIAGNOSIS — G8929 Other chronic pain: Secondary | ICD-10-CM | POA: Diagnosis not present

## 2015-08-20 DIAGNOSIS — M25562 Pain in left knee: Secondary | ICD-10-CM | POA: Diagnosis not present

## 2015-08-20 DIAGNOSIS — M25662 Stiffness of left knee, not elsewhere classified: Secondary | ICD-10-CM | POA: Diagnosis not present

## 2015-08-20 DIAGNOSIS — M6281 Muscle weakness (generalized): Secondary | ICD-10-CM | POA: Diagnosis not present

## 2015-08-20 DIAGNOSIS — S83105D Unspecified dislocation of left knee, subsequent encounter: Secondary | ICD-10-CM | POA: Diagnosis not present

## 2015-08-24 DIAGNOSIS — Z96652 Presence of left artificial knee joint: Secondary | ICD-10-CM | POA: Diagnosis not present

## 2015-08-24 DIAGNOSIS — N390 Urinary tract infection, site not specified: Secondary | ICD-10-CM | POA: Diagnosis not present

## 2015-08-24 DIAGNOSIS — M810 Age-related osteoporosis without current pathological fracture: Secondary | ICD-10-CM | POA: Diagnosis present

## 2015-08-24 DIAGNOSIS — M25362 Other instability, left knee: Secondary | ICD-10-CM | POA: Diagnosis not present

## 2015-08-24 DIAGNOSIS — M6281 Muscle weakness (generalized): Secondary | ICD-10-CM | POA: Diagnosis not present

## 2015-08-24 DIAGNOSIS — K592 Neurogenic bowel, not elsewhere classified: Secondary | ICD-10-CM | POA: Diagnosis present

## 2015-08-24 DIAGNOSIS — Z91040 Latex allergy status: Secondary | ICD-10-CM | POA: Diagnosis not present

## 2015-08-24 DIAGNOSIS — G8918 Other acute postprocedural pain: Secondary | ICD-10-CM | POA: Diagnosis not present

## 2015-08-24 DIAGNOSIS — M25562 Pain in left knee: Secondary | ICD-10-CM | POA: Diagnosis not present

## 2015-08-24 DIAGNOSIS — S83195A Other dislocation of left knee, initial encounter: Secondary | ICD-10-CM | POA: Diagnosis present

## 2015-08-24 DIAGNOSIS — Z7409 Other reduced mobility: Secondary | ICD-10-CM | POA: Diagnosis not present

## 2015-08-24 DIAGNOSIS — M24462 Recurrent dislocation, left knee: Secondary | ICD-10-CM | POA: Diagnosis not present

## 2015-08-24 DIAGNOSIS — N319 Neuromuscular dysfunction of bladder, unspecified: Secondary | ICD-10-CM | POA: Diagnosis present

## 2015-08-24 DIAGNOSIS — Z79891 Long term (current) use of opiate analgesic: Secondary | ICD-10-CM | POA: Diagnosis not present

## 2015-08-24 DIAGNOSIS — S83522A Sprain of posterior cruciate ligament of left knee, initial encounter: Secondary | ICD-10-CM | POA: Diagnosis present

## 2015-08-24 DIAGNOSIS — G8220 Paraplegia, unspecified: Secondary | ICD-10-CM | POA: Diagnosis present

## 2015-08-24 DIAGNOSIS — S83412A Sprain of medial collateral ligament of left knee, initial encounter: Secondary | ICD-10-CM | POA: Diagnosis present

## 2015-08-24 DIAGNOSIS — M1712 Unilateral primary osteoarthritis, left knee: Secondary | ICD-10-CM | POA: Diagnosis present

## 2015-08-24 DIAGNOSIS — G4089 Other seizures: Secondary | ICD-10-CM | POA: Diagnosis not present

## 2015-08-24 DIAGNOSIS — Q059 Spina bifida, unspecified: Secondary | ICD-10-CM | POA: Diagnosis not present

## 2015-08-24 DIAGNOSIS — Z880 Allergy status to penicillin: Secondary | ICD-10-CM | POA: Diagnosis not present

## 2015-08-24 DIAGNOSIS — K5900 Constipation, unspecified: Secondary | ICD-10-CM | POA: Diagnosis not present

## 2015-08-24 DIAGNOSIS — R262 Difficulty in walking, not elsewhere classified: Secondary | ICD-10-CM | POA: Diagnosis not present

## 2015-08-24 DIAGNOSIS — M179 Osteoarthritis of knee, unspecified: Secondary | ICD-10-CM | POA: Diagnosis not present

## 2015-08-24 DIAGNOSIS — Z471 Aftercare following joint replacement surgery: Secondary | ICD-10-CM | POA: Diagnosis not present

## 2015-08-24 DIAGNOSIS — M2550 Pain in unspecified joint: Secondary | ICD-10-CM | POA: Diagnosis not present

## 2015-08-24 DIAGNOSIS — R3982 Chronic bladder pain: Secondary | ICD-10-CM | POA: Diagnosis not present

## 2015-08-24 DIAGNOSIS — G8929 Other chronic pain: Secondary | ICD-10-CM | POA: Diagnosis not present

## 2015-08-24 DIAGNOSIS — E569 Vitamin deficiency, unspecified: Secondary | ICD-10-CM | POA: Diagnosis not present

## 2015-08-24 DIAGNOSIS — Z9889 Other specified postprocedural states: Secondary | ICD-10-CM | POA: Diagnosis not present

## 2015-08-24 DIAGNOSIS — S83512A Sprain of anterior cruciate ligament of left knee, initial encounter: Secondary | ICD-10-CM | POA: Diagnosis present

## 2015-08-27 ENCOUNTER — Telehealth: Payer: Self-pay | Admitting: *Deleted

## 2015-08-27 DIAGNOSIS — R600 Localized edema: Secondary | ICD-10-CM | POA: Diagnosis not present

## 2015-08-27 DIAGNOSIS — K5900 Constipation, unspecified: Secondary | ICD-10-CM | POA: Diagnosis not present

## 2015-08-27 DIAGNOSIS — M7989 Other specified soft tissue disorders: Secondary | ICD-10-CM | POA: Diagnosis not present

## 2015-08-27 DIAGNOSIS — M62838 Other muscle spasm: Secondary | ICD-10-CM | POA: Diagnosis not present

## 2015-08-27 DIAGNOSIS — M25562 Pain in left knee: Secondary | ICD-10-CM | POA: Diagnosis not present

## 2015-08-27 DIAGNOSIS — G8918 Other acute postprocedural pain: Secondary | ICD-10-CM | POA: Diagnosis not present

## 2015-08-27 DIAGNOSIS — M6281 Muscle weakness (generalized): Secondary | ICD-10-CM | POA: Diagnosis not present

## 2015-08-27 DIAGNOSIS — Z4802 Encounter for removal of sutures: Secondary | ICD-10-CM | POA: Diagnosis not present

## 2015-08-27 DIAGNOSIS — Q059 Spina bifida, unspecified: Secondary | ICD-10-CM | POA: Diagnosis not present

## 2015-08-27 DIAGNOSIS — N390 Urinary tract infection, site not specified: Secondary | ICD-10-CM | POA: Diagnosis not present

## 2015-08-27 DIAGNOSIS — Z471 Aftercare following joint replacement surgery: Secondary | ICD-10-CM | POA: Diagnosis not present

## 2015-08-27 DIAGNOSIS — N319 Neuromuscular dysfunction of bladder, unspecified: Secondary | ICD-10-CM | POA: Diagnosis not present

## 2015-08-27 DIAGNOSIS — R262 Difficulty in walking, not elsewhere classified: Secondary | ICD-10-CM | POA: Diagnosis not present

## 2015-08-27 DIAGNOSIS — E569 Vitamin deficiency, unspecified: Secondary | ICD-10-CM | POA: Diagnosis not present

## 2015-08-27 DIAGNOSIS — R3982 Chronic bladder pain: Secondary | ICD-10-CM | POA: Diagnosis not present

## 2015-08-27 DIAGNOSIS — G4089 Other seizures: Secondary | ICD-10-CM | POA: Diagnosis not present

## 2015-08-27 DIAGNOSIS — Z96652 Presence of left artificial knee joint: Secondary | ICD-10-CM | POA: Diagnosis not present

## 2015-08-27 DIAGNOSIS — Z9181 History of falling: Secondary | ICD-10-CM | POA: Diagnosis not present

## 2015-08-27 DIAGNOSIS — W19XXXA Unspecified fall, initial encounter: Secondary | ICD-10-CM | POA: Diagnosis not present

## 2015-08-27 DIAGNOSIS — M25462 Effusion, left knee: Secondary | ICD-10-CM | POA: Diagnosis not present

## 2015-08-27 DIAGNOSIS — M2550 Pain in unspecified joint: Secondary | ICD-10-CM | POA: Diagnosis not present

## 2015-08-27 DIAGNOSIS — Z7409 Other reduced mobility: Secondary | ICD-10-CM | POA: Diagnosis not present

## 2015-08-27 NOTE — Telephone Encounter (Signed)
Pt's mother called and is asking for [name] to call her back regarding patients surgery

## 2015-08-28 NOTE — Telephone Encounter (Signed)
Placed a call to [name], no answer. Left message to return the call.

## 2015-08-31 ENCOUNTER — Telehealth: Payer: Self-pay

## 2015-08-31 NOTE — Telephone Encounter (Signed)
Spoke to Ms. Deleeuw mother [name]), Mary Neal had her Left Knee Replacement  surgery on 08/24/2015 at [facility]. She's currently at [facility] Her mother states she has walked with the physical therapist 14 steps.

## 2015-08-31 NOTE — Telephone Encounter (Signed)
Pt's mother, [place], is returning ET's call.

## 2015-09-03 DIAGNOSIS — Q059 Spina bifida, unspecified: Secondary | ICD-10-CM | POA: Diagnosis not present

## 2015-09-03 DIAGNOSIS — M6281 Muscle weakness (generalized): Secondary | ICD-10-CM | POA: Diagnosis not present

## 2015-09-03 DIAGNOSIS — M25562 Pain in left knee: Secondary | ICD-10-CM | POA: Diagnosis not present

## 2015-09-03 DIAGNOSIS — R262 Difficulty in walking, not elsewhere classified: Secondary | ICD-10-CM | POA: Diagnosis not present

## 2015-09-04 DIAGNOSIS — M62838 Other muscle spasm: Secondary | ICD-10-CM | POA: Diagnosis not present

## 2015-09-04 DIAGNOSIS — Q059 Spina bifida, unspecified: Secondary | ICD-10-CM | POA: Diagnosis not present

## 2015-09-07 ENCOUNTER — Ambulatory Visit: Payer: Self-pay | Admitting: Physical Medicine & Rehabilitation

## 2015-09-08 DIAGNOSIS — M6281 Muscle weakness (generalized): Secondary | ICD-10-CM | POA: Diagnosis not present

## 2015-09-08 DIAGNOSIS — Z4802 Encounter for removal of sutures: Secondary | ICD-10-CM | POA: Diagnosis not present

## 2015-09-08 DIAGNOSIS — R262 Difficulty in walking, not elsewhere classified: Secondary | ICD-10-CM | POA: Diagnosis not present

## 2015-09-08 DIAGNOSIS — M25562 Pain in left knee: Secondary | ICD-10-CM | POA: Diagnosis not present

## 2015-09-08 DIAGNOSIS — Q059 Spina bifida, unspecified: Secondary | ICD-10-CM | POA: Diagnosis not present

## 2015-09-29 DIAGNOSIS — R262 Difficulty in walking, not elsewhere classified: Secondary | ICD-10-CM | POA: Diagnosis not present

## 2015-09-29 DIAGNOSIS — R600 Localized edema: Secondary | ICD-10-CM | POA: Diagnosis not present

## 2015-10-01 ENCOUNTER — Ambulatory Visit: Payer: Medicare Other | Admitting: Podiatry

## 2015-10-01 DIAGNOSIS — R262 Difficulty in walking, not elsewhere classified: Secondary | ICD-10-CM | POA: Diagnosis not present

## 2015-10-01 DIAGNOSIS — Q059 Spina bifida, unspecified: Secondary | ICD-10-CM | POA: Diagnosis not present

## 2015-10-01 DIAGNOSIS — M6281 Muscle weakness (generalized): Secondary | ICD-10-CM | POA: Diagnosis not present

## 2015-10-01 DIAGNOSIS — M25562 Pain in left knee: Secondary | ICD-10-CM | POA: Diagnosis not present

## 2015-10-06 DIAGNOSIS — M25562 Pain in left knee: Secondary | ICD-10-CM | POA: Diagnosis not present

## 2015-10-06 DIAGNOSIS — Q059 Spina bifida, unspecified: Secondary | ICD-10-CM | POA: Diagnosis not present

## 2015-10-06 DIAGNOSIS — R262 Difficulty in walking, not elsewhere classified: Secondary | ICD-10-CM | POA: Diagnosis not present

## 2015-10-06 DIAGNOSIS — M6281 Muscle weakness (generalized): Secondary | ICD-10-CM | POA: Diagnosis not present

## 2015-10-07 DIAGNOSIS — Q059 Spina bifida, unspecified: Secondary | ICD-10-CM | POA: Diagnosis not present

## 2015-10-07 DIAGNOSIS — Z9181 History of falling: Secondary | ICD-10-CM | POA: Diagnosis not present

## 2015-10-07 DIAGNOSIS — M25462 Effusion, left knee: Secondary | ICD-10-CM | POA: Diagnosis not present

## 2015-10-07 DIAGNOSIS — Z471 Aftercare following joint replacement surgery: Secondary | ICD-10-CM | POA: Diagnosis not present

## 2015-10-07 DIAGNOSIS — Z96652 Presence of left artificial knee joint: Secondary | ICD-10-CM | POA: Diagnosis not present

## 2015-10-07 DIAGNOSIS — M6281 Muscle weakness (generalized): Secondary | ICD-10-CM | POA: Diagnosis not present

## 2015-10-07 DIAGNOSIS — M7989 Other specified soft tissue disorders: Secondary | ICD-10-CM | POA: Diagnosis not present

## 2015-10-07 DIAGNOSIS — W19XXXA Unspecified fall, initial encounter: Secondary | ICD-10-CM | POA: Diagnosis not present

## 2015-10-08 ENCOUNTER — Telehealth: Payer: Self-pay | Admitting: *Deleted

## 2015-10-08 NOTE — Telephone Encounter (Signed)
Mary Neal is out of rehab and her mother is wanting to speak to [place].  Please call.

## 2015-10-09 DIAGNOSIS — R262 Difficulty in walking, not elsewhere classified: Secondary | ICD-10-CM | POA: Diagnosis not present

## 2015-10-09 DIAGNOSIS — G4089 Other seizures: Secondary | ICD-10-CM | POA: Diagnosis not present

## 2015-10-09 DIAGNOSIS — G8929 Other chronic pain: Secondary | ICD-10-CM | POA: Diagnosis not present

## 2015-10-09 DIAGNOSIS — Z96652 Presence of left artificial knee joint: Secondary | ICD-10-CM | POA: Diagnosis not present

## 2015-10-09 DIAGNOSIS — Q059 Spina bifida, unspecified: Secondary | ICD-10-CM | POA: Diagnosis not present

## 2015-10-09 DIAGNOSIS — M6281 Muscle weakness (generalized): Secondary | ICD-10-CM | POA: Diagnosis not present

## 2015-10-09 NOTE — Telephone Encounter (Signed)
I spoke to Ms. Csonka mother, she has been discharged from Rehab. She was looking for an appointment with Dr. Naaman Plummer. Her appointment will be 10/14/15 at 3:00 p.m.Marland Kitchen She was instructed to come at 2:45. She verbalizes understanding. She has a prescription for Butrans, she has 6 tablets of hydrocodone. I was going to have her pick up a prescription for Hydrocodone today, she states " Hodan hasn't been taking the hydrocodone.

## 2015-10-14 ENCOUNTER — Encounter: Payer: Self-pay | Admitting: Physical Medicine & Rehabilitation

## 2015-10-14 ENCOUNTER — Encounter: Payer: Medicare Other | Attending: Physical Medicine & Rehabilitation | Admitting: Physical Medicine & Rehabilitation

## 2015-10-14 VITALS — BP 140/55 | HR 76 | Resp 14

## 2015-10-14 DIAGNOSIS — Z5181 Encounter for therapeutic drug level monitoring: Secondary | ICD-10-CM

## 2015-10-14 DIAGNOSIS — M1712 Unilateral primary osteoarthritis, left knee: Secondary | ICD-10-CM | POA: Diagnosis not present

## 2015-10-14 DIAGNOSIS — Z79899 Other long term (current) drug therapy: Secondary | ICD-10-CM

## 2015-10-14 DIAGNOSIS — G8220 Paraplegia, unspecified: Secondary | ICD-10-CM | POA: Diagnosis not present

## 2015-10-14 DIAGNOSIS — G839 Paralytic syndrome, unspecified: Secondary | ICD-10-CM | POA: Diagnosis not present

## 2015-10-14 DIAGNOSIS — Q059 Spina bifida, unspecified: Secondary | ICD-10-CM | POA: Insufficient documentation

## 2015-10-14 DIAGNOSIS — G894 Chronic pain syndrome: Secondary | ICD-10-CM | POA: Diagnosis not present

## 2015-10-14 MED ORDER — BUPRENORPHINE 7.5 MCG/HR TD PTWK: 1.0000 | MEDICATED_PATCH | TRANSDERMAL | Status: DC

## 2015-10-14 NOTE — Progress Notes (Signed)
Subjective:    Patient ID: Mary Neal, female    DOB: 16-Aug-1965, 50 y.o.   MRN: [id number]  HPI  Mary Neal is here in follow up of her chronic pain. She had a left TKA in February at [facility]. She had great results!  Pain levels are 0!!!  She is off the hydrocodone. She is still using the gabapentin on a PRN basis. She is on 15mcg butrans. She uses a rare oxycodone given to her by ortho. Baclofen is on board for spasms.   She is able to walk with her lofstrand crutches. She uses her w/c for longer distances.   Pain Inventory Average Pain 0 Pain Right Now 0 My pain is no pain  In the last 24 hours, has pain interfered with the following? General activity 0 Relation with others 0 Enjoyment of life 0 What TIME of day is your pain at its worst? no pain Sleep (in general) Good  Pain is worse with: no pain Pain improves with: no pain Relief from Meds: no pain  Mobility walk with assistance use a walker  Function disabled: date disabled .  Neuro/Psych No problems in this area  Prior Studies Any changes since last visit?  yes  Physicians involved in your care Any changes since last visit?  no   Family History  Problem Relation Age of Onset  . Hypertension Mother    Social History   Social History  . Marital Status: Single    Spouse Name: N/A  . Number of Children: N/A  . Years of Education: N/A   Social History Main Topics  . Smoking status: Never Smoker   . Smokeless tobacco: Never Used  . Alcohol Use: No  . Drug Use: No  . Sexual Activity: No   Other Topics Concern  . None   Social History Narrative   ** Merged History Encounter **       History reviewed. No pertinent past surgical history. Past Medical History  Diagnosis Date  . Spina bifida ([place])   . Abnormal Pap smear 2013  . Wears glasses   . Uterine prolapse    BP 140/55 mmHg  Pulse 76  Resp 14  SpO2 100%  Opioid Risk Score:   Fall Risk Score:  `1  Depression screen PHQ  2/9  Depression screen [facility] 2/9 04/13/2015 03/12/2015 02/13/2015 09/26/2014  Decreased Interest 1 0 1 1  Down, Depressed, Hopeless 1 0 1 1  PHQ - 2 Score 2 0 2 2  Altered sleeping - 1 - 0  Tired, decreased energy - 0 - 1  Change in appetite - 0 - 0  Feeling bad or failure about yourself  - 0 - 0  Trouble concentrating - 0 - 0  Moving slowly or fidgety/restless - 1 - 0  Suicidal thoughts - 0 - 0  PHQ-9 Score - 2 - 3     Review of Systems  All other systems reviewed and are negative.      Objective:   Physical Exam  General: Alert and oriented x 3, No apparent distress  HEENT: Head is normocephalic, atraumatic, PERRLA, EOMI, sclera anicteric, oral mucosa pink and moist, dentition intact, ext ear canals clear,  Neck: Supple without JVD or lymphadenopathy  Heart: Reg rate and rhythm. No murmurs rubs or gallops  Chest: CTA bilaterally without wheezes, rales, or rhonchi; no distress  Abdomen: Soft, non-tender, non-distended, bowel sounds positive.  Extremities: No clubbing, cyanosis, or edema. Pulses are 2+  Skin: Clean  and intact without signs of breakdown  Neuro: absent sensation below the lower calf. Profound atrophy of all distal musculature. She has minimal to no hip extension or knee flexion. Cognitively she's generally appropriate, CN notable for dysconjugate gaze. Hearing seems intact.  Musculoskeletal: Likely with complete MCL,ACL, PCL tears. Ongoing internal rotation of the femur with stance and swing. Valgus deformity improved. Minimal pain with gait!  Psych: Pt's affect is appropriate. Pt is cooperative    Assessment & Plan:  1. Spina Bifida with profound distal lower extremity weakness, sensory loss, neurogenic bowel/bladder  2. Left knee pain/ OA with multiple ligament injuries and severe femoral inward rotation and valgus at knee   Plan:  1.Dc gabapentin as she's hardly using at this point. Stop meloxicam for now  2. S/p left TKA with great results!! Discussed the fact  that she still needs to be reasonable about her activities. She should use a w/c for longer distances in the community. 3. She may continue low dose oxycodone for more severe pain.  4. Reduce butrans patch to 7.18mcg for one month then stop 5. Follow up with me in about 3 month. 15 minutes of face to face patient care time were spent during this visit. All questions were encouraged and answered.

## 2015-10-14 NOTE — Patient Instructions (Signed)
.  STOP GABAPENTIN.  REDUCE THE BUTRANS PATCH TO 7.5MCG PER WEEK FOR FOUR PATCHES THEN STOP.   YOU CAN USE THE OXYCODONE FOR BREAKTHROUGH PAIN   YOU CAN USE BACLOFEN FOR SPASMS.Marland Kitchen  PLEASE CALL ME WITH ANY PROBLEMS OR QUESTIONS [id number]).

## 2015-10-15 DIAGNOSIS — G4089 Other seizures: Secondary | ICD-10-CM | POA: Diagnosis not present

## 2015-10-15 DIAGNOSIS — Q059 Spina bifida, unspecified: Secondary | ICD-10-CM | POA: Diagnosis not present

## 2015-10-15 DIAGNOSIS — Z0000 Encounter for general adult medical examination without abnormal findings: Secondary | ICD-10-CM | POA: Diagnosis not present

## 2015-10-15 DIAGNOSIS — G8929 Other chronic pain: Secondary | ICD-10-CM | POA: Diagnosis not present

## 2015-10-15 DIAGNOSIS — M6281 Muscle weakness (generalized): Secondary | ICD-10-CM | POA: Diagnosis not present

## 2015-10-15 DIAGNOSIS — Z96652 Presence of left artificial knee joint: Secondary | ICD-10-CM | POA: Diagnosis not present

## 2015-10-15 DIAGNOSIS — R262 Difficulty in walking, not elsewhere classified: Secondary | ICD-10-CM | POA: Diagnosis not present

## 2015-10-15 DIAGNOSIS — N390 Urinary tract infection, site not specified: Secondary | ICD-10-CM | POA: Diagnosis not present

## 2015-10-15 DIAGNOSIS — B9689 Other specified bacterial agents as the cause of diseases classified elsewhere: Secondary | ICD-10-CM | POA: Diagnosis not present

## 2015-10-16 DIAGNOSIS — Z96652 Presence of left artificial knee joint: Secondary | ICD-10-CM | POA: Diagnosis not present

## 2015-10-16 DIAGNOSIS — R262 Difficulty in walking, not elsewhere classified: Secondary | ICD-10-CM | POA: Diagnosis not present

## 2015-10-16 DIAGNOSIS — M6281 Muscle weakness (generalized): Secondary | ICD-10-CM | POA: Diagnosis not present

## 2015-10-16 DIAGNOSIS — Q059 Spina bifida, unspecified: Secondary | ICD-10-CM | POA: Diagnosis not present

## 2015-10-16 DIAGNOSIS — G8929 Other chronic pain: Secondary | ICD-10-CM | POA: Diagnosis not present

## 2015-10-16 DIAGNOSIS — G4089 Other seizures: Secondary | ICD-10-CM | POA: Diagnosis not present

## 2015-10-19 DIAGNOSIS — G8929 Other chronic pain: Secondary | ICD-10-CM | POA: Diagnosis not present

## 2015-10-19 DIAGNOSIS — R262 Difficulty in walking, not elsewhere classified: Secondary | ICD-10-CM | POA: Diagnosis not present

## 2015-10-19 DIAGNOSIS — Z96652 Presence of left artificial knee joint: Secondary | ICD-10-CM | POA: Diagnosis not present

## 2015-10-19 DIAGNOSIS — Q059 Spina bifida, unspecified: Secondary | ICD-10-CM | POA: Diagnosis not present

## 2015-10-19 DIAGNOSIS — G4089 Other seizures: Secondary | ICD-10-CM | POA: Diagnosis not present

## 2015-10-19 DIAGNOSIS — M6281 Muscle weakness (generalized): Secondary | ICD-10-CM | POA: Diagnosis not present

## 2015-10-19 LAB — TOXASSURE SELECT,+ANTIDEPR,UR: PDF: 0

## 2015-10-19 NOTE — Progress Notes (Signed)
Urine drug screen for this encounter is consistent for prescribed medication butrans

## 2015-10-20 DIAGNOSIS — G8929 Other chronic pain: Secondary | ICD-10-CM | POA: Diagnosis not present

## 2015-10-20 DIAGNOSIS — G4089 Other seizures: Secondary | ICD-10-CM | POA: Diagnosis not present

## 2015-10-20 DIAGNOSIS — Z96652 Presence of left artificial knee joint: Secondary | ICD-10-CM | POA: Diagnosis not present

## 2015-10-20 DIAGNOSIS — R262 Difficulty in walking, not elsewhere classified: Secondary | ICD-10-CM | POA: Diagnosis not present

## 2015-10-20 DIAGNOSIS — M6281 Muscle weakness (generalized): Secondary | ICD-10-CM | POA: Diagnosis not present

## 2015-10-20 DIAGNOSIS — Q059 Spina bifida, unspecified: Secondary | ICD-10-CM | POA: Diagnosis not present

## 2015-10-22 DIAGNOSIS — Q059 Spina bifida, unspecified: Secondary | ICD-10-CM | POA: Diagnosis not present

## 2015-10-22 DIAGNOSIS — Z96652 Presence of left artificial knee joint: Secondary | ICD-10-CM | POA: Diagnosis not present

## 2015-10-22 DIAGNOSIS — R262 Difficulty in walking, not elsewhere classified: Secondary | ICD-10-CM | POA: Diagnosis not present

## 2015-10-22 DIAGNOSIS — M6281 Muscle weakness (generalized): Secondary | ICD-10-CM | POA: Diagnosis not present

## 2015-10-22 DIAGNOSIS — G4089 Other seizures: Secondary | ICD-10-CM | POA: Diagnosis not present

## 2015-10-22 DIAGNOSIS — G8929 Other chronic pain: Secondary | ICD-10-CM | POA: Diagnosis not present

## 2015-10-26 DIAGNOSIS — R262 Difficulty in walking, not elsewhere classified: Secondary | ICD-10-CM | POA: Diagnosis not present

## 2015-10-26 DIAGNOSIS — Z96652 Presence of left artificial knee joint: Secondary | ICD-10-CM | POA: Diagnosis not present

## 2015-10-26 DIAGNOSIS — Q059 Spina bifida, unspecified: Secondary | ICD-10-CM | POA: Diagnosis not present

## 2015-10-26 DIAGNOSIS — M6281 Muscle weakness (generalized): Secondary | ICD-10-CM | POA: Diagnosis not present

## 2015-10-26 DIAGNOSIS — G4089 Other seizures: Secondary | ICD-10-CM | POA: Diagnosis not present

## 2015-10-26 DIAGNOSIS — G8929 Other chronic pain: Secondary | ICD-10-CM | POA: Diagnosis not present

## 2015-10-27 DIAGNOSIS — R262 Difficulty in walking, not elsewhere classified: Secondary | ICD-10-CM | POA: Diagnosis not present

## 2015-10-27 DIAGNOSIS — G8929 Other chronic pain: Secondary | ICD-10-CM | POA: Diagnosis not present

## 2015-10-27 DIAGNOSIS — Z96652 Presence of left artificial knee joint: Secondary | ICD-10-CM | POA: Diagnosis not present

## 2015-10-27 DIAGNOSIS — G4089 Other seizures: Secondary | ICD-10-CM | POA: Diagnosis not present

## 2015-10-27 DIAGNOSIS — Q059 Spina bifida, unspecified: Secondary | ICD-10-CM | POA: Diagnosis not present

## 2015-10-27 DIAGNOSIS — M6281 Muscle weakness (generalized): Secondary | ICD-10-CM | POA: Diagnosis not present

## 2015-10-30 DIAGNOSIS — R262 Difficulty in walking, not elsewhere classified: Secondary | ICD-10-CM | POA: Diagnosis not present

## 2015-10-30 DIAGNOSIS — Z96652 Presence of left artificial knee joint: Secondary | ICD-10-CM | POA: Diagnosis not present

## 2015-10-30 DIAGNOSIS — G8929 Other chronic pain: Secondary | ICD-10-CM | POA: Diagnosis not present

## 2015-10-30 DIAGNOSIS — Q059 Spina bifida, unspecified: Secondary | ICD-10-CM | POA: Diagnosis not present

## 2015-10-30 DIAGNOSIS — M6281 Muscle weakness (generalized): Secondary | ICD-10-CM | POA: Diagnosis not present

## 2015-10-30 DIAGNOSIS — G4089 Other seizures: Secondary | ICD-10-CM | POA: Diagnosis not present

## 2015-11-02 DIAGNOSIS — M6281 Muscle weakness (generalized): Secondary | ICD-10-CM | POA: Diagnosis not present

## 2015-11-02 DIAGNOSIS — G8929 Other chronic pain: Secondary | ICD-10-CM | POA: Diagnosis not present

## 2015-11-02 DIAGNOSIS — Z96652 Presence of left artificial knee joint: Secondary | ICD-10-CM | POA: Diagnosis not present

## 2015-11-02 DIAGNOSIS — Q059 Spina bifida, unspecified: Secondary | ICD-10-CM | POA: Diagnosis not present

## 2015-11-02 DIAGNOSIS — G4089 Other seizures: Secondary | ICD-10-CM | POA: Diagnosis not present

## 2015-11-02 DIAGNOSIS — R262 Difficulty in walking, not elsewhere classified: Secondary | ICD-10-CM | POA: Diagnosis not present

## 2015-11-03 DIAGNOSIS — R262 Difficulty in walking, not elsewhere classified: Secondary | ICD-10-CM | POA: Diagnosis not present

## 2015-11-03 DIAGNOSIS — Q059 Spina bifida, unspecified: Secondary | ICD-10-CM | POA: Diagnosis not present

## 2015-11-03 DIAGNOSIS — M6281 Muscle weakness (generalized): Secondary | ICD-10-CM | POA: Diagnosis not present

## 2015-11-03 DIAGNOSIS — G4089 Other seizures: Secondary | ICD-10-CM | POA: Diagnosis not present

## 2015-11-03 DIAGNOSIS — G8929 Other chronic pain: Secondary | ICD-10-CM | POA: Diagnosis not present

## 2015-11-03 DIAGNOSIS — Z96652 Presence of left artificial knee joint: Secondary | ICD-10-CM | POA: Diagnosis not present

## 2015-11-04 DIAGNOSIS — Z96652 Presence of left artificial knee joint: Secondary | ICD-10-CM | POA: Diagnosis not present

## 2015-11-04 DIAGNOSIS — M6281 Muscle weakness (generalized): Secondary | ICD-10-CM | POA: Diagnosis not present

## 2015-11-04 DIAGNOSIS — R262 Difficulty in walking, not elsewhere classified: Secondary | ICD-10-CM | POA: Diagnosis not present

## 2015-11-04 DIAGNOSIS — G4089 Other seizures: Secondary | ICD-10-CM | POA: Diagnosis not present

## 2015-11-04 DIAGNOSIS — Q059 Spina bifida, unspecified: Secondary | ICD-10-CM | POA: Diagnosis not present

## 2015-11-04 DIAGNOSIS — G8929 Other chronic pain: Secondary | ICD-10-CM | POA: Diagnosis not present

## 2015-11-06 DIAGNOSIS — M6281 Muscle weakness (generalized): Secondary | ICD-10-CM | POA: Diagnosis not present

## 2015-11-06 DIAGNOSIS — Z96652 Presence of left artificial knee joint: Secondary | ICD-10-CM | POA: Diagnosis not present

## 2015-11-06 DIAGNOSIS — G8929 Other chronic pain: Secondary | ICD-10-CM | POA: Diagnosis not present

## 2015-11-06 DIAGNOSIS — R262 Difficulty in walking, not elsewhere classified: Secondary | ICD-10-CM | POA: Diagnosis not present

## 2015-11-06 DIAGNOSIS — G4089 Other seizures: Secondary | ICD-10-CM | POA: Diagnosis not present

## 2015-11-06 DIAGNOSIS — Q059 Spina bifida, unspecified: Secondary | ICD-10-CM | POA: Diagnosis not present

## 2015-11-16 DIAGNOSIS — Z96652 Presence of left artificial knee joint: Secondary | ICD-10-CM | POA: Diagnosis not present

## 2015-11-16 DIAGNOSIS — G4089 Other seizures: Secondary | ICD-10-CM | POA: Diagnosis not present

## 2015-11-16 DIAGNOSIS — M6281 Muscle weakness (generalized): Secondary | ICD-10-CM | POA: Diagnosis not present

## 2015-11-16 DIAGNOSIS — R262 Difficulty in walking, not elsewhere classified: Secondary | ICD-10-CM | POA: Diagnosis not present

## 2015-11-16 DIAGNOSIS — G8929 Other chronic pain: Secondary | ICD-10-CM | POA: Diagnosis not present

## 2015-11-16 DIAGNOSIS — Q059 Spina bifida, unspecified: Secondary | ICD-10-CM | POA: Diagnosis not present

## 2015-11-18 DIAGNOSIS — Z79899 Other long term (current) drug therapy: Secondary | ICD-10-CM | POA: Diagnosis not present

## 2015-11-18 DIAGNOSIS — Z982 Presence of cerebrospinal fluid drainage device: Secondary | ICD-10-CM | POA: Diagnosis not present

## 2015-11-18 DIAGNOSIS — K5900 Constipation, unspecified: Secondary | ICD-10-CM | POA: Diagnosis not present

## 2015-11-18 DIAGNOSIS — Z91040 Latex allergy status: Secondary | ICD-10-CM | POA: Diagnosis not present

## 2015-11-18 DIAGNOSIS — N1330 Unspecified hydronephrosis: Secondary | ICD-10-CM | POA: Diagnosis not present

## 2015-11-18 DIAGNOSIS — N319 Neuromuscular dysfunction of bladder, unspecified: Secondary | ICD-10-CM | POA: Diagnosis not present

## 2015-11-18 DIAGNOSIS — R93422 Abnormal radiologic findings on diagnostic imaging of left kidney: Secondary | ICD-10-CM | POA: Diagnosis not present

## 2015-11-18 DIAGNOSIS — R93421 Abnormal radiologic findings on diagnostic imaging of right kidney: Secondary | ICD-10-CM | POA: Diagnosis not present

## 2015-11-18 DIAGNOSIS — Z880 Allergy status to penicillin: Secondary | ICD-10-CM | POA: Diagnosis not present

## 2015-11-18 DIAGNOSIS — Q059 Spina bifida, unspecified: Secondary | ICD-10-CM | POA: Diagnosis not present

## 2015-11-19 DIAGNOSIS — Z96652 Presence of left artificial knee joint: Secondary | ICD-10-CM | POA: Diagnosis not present

## 2015-11-19 DIAGNOSIS — R262 Difficulty in walking, not elsewhere classified: Secondary | ICD-10-CM | POA: Diagnosis not present

## 2015-11-19 DIAGNOSIS — Q059 Spina bifida, unspecified: Secondary | ICD-10-CM | POA: Diagnosis not present

## 2015-11-19 DIAGNOSIS — G4089 Other seizures: Secondary | ICD-10-CM | POA: Diagnosis not present

## 2015-11-19 DIAGNOSIS — M6281 Muscle weakness (generalized): Secondary | ICD-10-CM | POA: Diagnosis not present

## 2015-11-19 DIAGNOSIS — G8929 Other chronic pain: Secondary | ICD-10-CM | POA: Diagnosis not present

## 2015-11-20 DIAGNOSIS — Q059 Spina bifida, unspecified: Secondary | ICD-10-CM | POA: Diagnosis not present

## 2015-11-20 DIAGNOSIS — G8929 Other chronic pain: Secondary | ICD-10-CM | POA: Diagnosis not present

## 2015-11-20 DIAGNOSIS — Z96652 Presence of left artificial knee joint: Secondary | ICD-10-CM | POA: Diagnosis not present

## 2015-11-20 DIAGNOSIS — M6281 Muscle weakness (generalized): Secondary | ICD-10-CM | POA: Diagnosis not present

## 2015-11-20 DIAGNOSIS — G4089 Other seizures: Secondary | ICD-10-CM | POA: Diagnosis not present

## 2015-11-20 DIAGNOSIS — R262 Difficulty in walking, not elsewhere classified: Secondary | ICD-10-CM | POA: Diagnosis not present

## 2015-11-23 DIAGNOSIS — Q059 Spina bifida, unspecified: Secondary | ICD-10-CM | POA: Diagnosis not present

## 2015-11-23 DIAGNOSIS — M179 Osteoarthritis of knee, unspecified: Secondary | ICD-10-CM | POA: Diagnosis not present

## 2015-11-23 DIAGNOSIS — Z96652 Presence of left artificial knee joint: Secondary | ICD-10-CM | POA: Diagnosis not present

## 2015-11-23 DIAGNOSIS — N319 Neuromuscular dysfunction of bladder, unspecified: Secondary | ICD-10-CM | POA: Diagnosis not present

## 2015-11-24 DIAGNOSIS — Q059 Spina bifida, unspecified: Secondary | ICD-10-CM | POA: Diagnosis not present

## 2015-11-24 DIAGNOSIS — R262 Difficulty in walking, not elsewhere classified: Secondary | ICD-10-CM | POA: Diagnosis not present

## 2015-11-24 DIAGNOSIS — M6281 Muscle weakness (generalized): Secondary | ICD-10-CM | POA: Diagnosis not present

## 2015-11-24 DIAGNOSIS — G8929 Other chronic pain: Secondary | ICD-10-CM | POA: Diagnosis not present

## 2015-11-24 DIAGNOSIS — Z96652 Presence of left artificial knee joint: Secondary | ICD-10-CM | POA: Diagnosis not present

## 2015-11-24 DIAGNOSIS — G4089 Other seizures: Secondary | ICD-10-CM | POA: Diagnosis not present

## 2015-12-03 DIAGNOSIS — R262 Difficulty in walking, not elsewhere classified: Secondary | ICD-10-CM | POA: Diagnosis not present

## 2015-12-03 DIAGNOSIS — Q059 Spina bifida, unspecified: Secondary | ICD-10-CM | POA: Diagnosis not present

## 2015-12-03 DIAGNOSIS — M6281 Muscle weakness (generalized): Secondary | ICD-10-CM | POA: Diagnosis not present

## 2015-12-03 DIAGNOSIS — G4089 Other seizures: Secondary | ICD-10-CM | POA: Diagnosis not present

## 2015-12-03 DIAGNOSIS — Z96652 Presence of left artificial knee joint: Secondary | ICD-10-CM | POA: Diagnosis not present

## 2015-12-03 DIAGNOSIS — G8929 Other chronic pain: Secondary | ICD-10-CM | POA: Diagnosis not present

## 2015-12-25 ENCOUNTER — Telehealth: Payer: Self-pay | Admitting: Physical Medicine & Rehabilitation

## 2015-12-25 ENCOUNTER — Telehealth: Payer: Self-pay | Admitting: Registered Nurse

## 2015-12-25 DIAGNOSIS — G894 Chronic pain syndrome: Secondary | ICD-10-CM

## 2015-12-25 MED ORDER — BUPRENORPHINE 7.5 MCG/HR TD PTWK: 1.0000 | MEDICATED_PATCH | TRANSDERMAL | Status: DC

## 2015-12-25 NOTE — Telephone Encounter (Signed)
According to [facility] Buprenorphine was picked up on 12/03/2015. Ms. Massarelli has an appointment with Dr. Naaman Plummer on 01/11/2016. Prescription printed. Ms. Montellano has been called and they can pick up prescription on Monday December 28, 2015, she verbalizes understanding.

## 2015-12-25 NOTE — Telephone Encounter (Signed)
Patient is needing a refill on Buprenorphine.  Please call mother when this is done.

## 2015-12-25 NOTE — Telephone Encounter (Signed)
Spoke with mother and she will pick up Rx next week.

## 2016-01-11 ENCOUNTER — Encounter: Payer: Medicare Other | Attending: Physical Medicine & Rehabilitation | Admitting: Physical Medicine & Rehabilitation

## 2016-01-11 ENCOUNTER — Encounter: Payer: Self-pay | Admitting: Physical Medicine & Rehabilitation

## 2016-01-11 VITALS — BP 124/63 | HR 106

## 2016-01-11 DIAGNOSIS — Q059 Spina bifida, unspecified: Secondary | ICD-10-CM | POA: Insufficient documentation

## 2016-01-11 DIAGNOSIS — G8220 Paraplegia, unspecified: Secondary | ICD-10-CM | POA: Diagnosis not present

## 2016-01-11 DIAGNOSIS — G839 Paralytic syndrome, unspecified: Secondary | ICD-10-CM | POA: Insufficient documentation

## 2016-01-11 DIAGNOSIS — M1712 Unilateral primary osteoarthritis, left knee: Secondary | ICD-10-CM | POA: Diagnosis not present

## 2016-01-11 NOTE — Patient Instructions (Signed)
STOP THE BUTRANS PATCH.  YOU CAN RESTART IF YOUR PAIN IS NOT CONTROLLED   TRY TAKING A MULT-VITAMIN WITH CALCIUM, VITAMIN D, MAGNESIUM, AND POTASSIUM TO HELP WITH CRAMPS. DRINK PLENTY OF WATER.

## 2016-01-11 NOTE — Progress Notes (Signed)
Subjective:    Patient ID: Mary Neal, female    DOB: May 22, 1966, 50 y.o.   MRN: [id number]  HPI   Shelisha is here in follow up of her chronic pain and gait disorder. She has been doing very well. She has noticed an occasional tremor in her hands and legs which took place a few weeks ago for the period of 3-4 days. She also has cramping in her right hands on occasion.    Pain Inventory Average Pain 0 Pain Right Now 0 My pain is no pain  In the last 24 hours, has pain interfered with the following? General activity 4 Relation with others 8 Enjoyment of life 9 What TIME of day is your pain at its worst? no pain Sleep (in general) Good  Pain is worse with: no pain Pain improves with: no pain Relief from Meds: no pain  Mobility walk with assistance Do you have any goals in this area?  no  Function disabled: date disabled . I need assistance with the following:  bathing  Neuro/Psych bladder control problems bowel control problems  Prior Studies Any changes since last visit?  no  Physicians involved in your care Any changes since last visit?  no   Family History  Problem Relation Age of Onset  . Hypertension Mother    Social History   Social History  . Marital Status: Single    Spouse Name: N/A  . Number of Children: N/A  . Years of Education: N/A   Social History Main Topics  . Smoking status: Never Smoker   . Smokeless tobacco: Never Used  . Alcohol Use: No  . Drug Use: No  . Sexual Activity: No   Other Topics Concern  . None   Social History Narrative   ** Merged History Encounter **       History reviewed. No pertinent past surgical history. Past Medical History  Diagnosis Date  . Spina bifida ([place])   . Abnormal Pap smear 2013  . Wears glasses   . Uterine prolapse    BP 124/63 mmHg  Pulse 106  SpO2 97%  Opioid Risk Score:   Fall Risk Score:  `1  Depression screen PHQ 2/9  Depression screen [facility] 2/9 04/13/2015 03/12/2015  02/13/2015 09/26/2014  Decreased Interest 1 0 1 1  Down, Depressed, Hopeless 1 0 1 1  PHQ - 2 Score 2 0 2 2  Altered sleeping - 1 - 0  Tired, decreased energy - 0 - 1  Change in appetite - 0 - 0  Feeling bad or failure about yourself  - 0 - 0  Trouble concentrating - 0 - 0  Moving slowly or fidgety/restless - 1 - 0  Suicidal thoughts - 0 - 0  PHQ-9 Score - 2 - 3     Review of Systems  Constitutional: Negative.   HENT: Negative.   Eyes: Negative.   Respiratory: Negative.   Cardiovascular: Negative.   Gastrointestinal: Positive for constipation.  Endocrine: Negative.   Genitourinary: Positive for difficulty urinating.  Musculoskeletal: Positive for gait problem.  Skin: Negative.   Allergic/Immunologic: Negative.   Neurological: Positive for tremors.  Hematological: Negative.   Psychiatric/Behavioral: Negative.   All other systems reviewed and are negative.      Objective:   Physical Exam  General: Alert and oriented x 3, No apparent distress  HEENT: Head is normocephalic, atraumatic, PERRLA, EOMI, sclera anicteric, oral mucosa pink and moist, dentition intact, ext ear canals clear,  Neck: Supple  without JVD or lymphadenopathy  Heart: Reg rate and rhythm. No murmurs rubs or gallops  Chest: CTA bilaterally without wheezes, rales, or rhonchi; no distress  Abdomen: Soft, non-tender, non-distended, bowel sounds positive.  Extremities: No clubbing, cyanosis, or edema. Pulses are 2+  Skin: Clean and intact without signs of breakdown  Neuro: absent sensation below the lower calf. Profound atrophy of all distal musculature. She has minimal to no hip extension or knee flexion. Cognitively she's generally appropriate, CN notable for dysconjugate gaze. Hearing seems intact.  Musculoskeletal: much improved knee control in stance---decreased valgus deformity. Still externally rotates hips to help clear the legs during swing phase.   Psych: Pt's affect is appropriate. Pt is cooperative     Assessment & Plan:  1. Spina Bifida with profound distal lower extremity weakness, sensory loss, neurogenic bowel/bladder  2. Left knee pain/ OA with multiple ligament injuries and severe femoral inward rotation and valgus at knee   Plan:  1. Discussed energy conservation and realistic activity levels to avoid any other wear and tear damage to the knees or hips.  2. S/p left TKA with great results!! Discussed the fact that she still needs to be reasonable about her activities. She should use a w/c for longer distances in the community.  3. She may continue low dose oxycodone for more severe pain.  4. Stop butrans patch. She has two left. If pain is unbearable, she can resume and we can give her a 5mg  patch when those two patches run out.  5. Otherwise will follow up with me in about 4 month. 15 minutes of face to face patient care time were spent during this visit. All questions were encouraged and answered.

## 2016-03-28 DIAGNOSIS — N319 Neuromuscular dysfunction of bladder, unspecified: Secondary | ICD-10-CM | POA: Diagnosis not present

## 2016-03-28 DIAGNOSIS — R269 Unspecified abnormalities of gait and mobility: Secondary | ICD-10-CM | POA: Diagnosis not present

## 2016-03-28 DIAGNOSIS — Q059 Spina bifida, unspecified: Secondary | ICD-10-CM | POA: Diagnosis not present

## 2016-03-28 DIAGNOSIS — K592 Neurogenic bowel, not elsewhere classified: Secondary | ICD-10-CM | POA: Diagnosis not present

## 2016-03-29 DIAGNOSIS — H5500 Unspecified nystagmus: Secondary | ICD-10-CM | POA: Diagnosis not present

## 2016-03-29 DIAGNOSIS — H5000 Unspecified esotropia: Secondary | ICD-10-CM | POA: Diagnosis not present

## 2016-03-30 DIAGNOSIS — M25562 Pain in left knee: Secondary | ICD-10-CM | POA: Diagnosis not present

## 2016-03-30 DIAGNOSIS — M179 Osteoarthritis of knee, unspecified: Secondary | ICD-10-CM | POA: Diagnosis not present

## 2016-03-30 DIAGNOSIS — M1712 Unilateral primary osteoarthritis, left knee: Secondary | ICD-10-CM | POA: Diagnosis not present

## 2016-04-11 DIAGNOSIS — R7309 Other abnormal glucose: Secondary | ICD-10-CM | POA: Diagnosis not present

## 2016-04-11 DIAGNOSIS — Z79899 Other long term (current) drug therapy: Secondary | ICD-10-CM | POA: Diagnosis not present

## 2016-04-11 DIAGNOSIS — E559 Vitamin D deficiency, unspecified: Secondary | ICD-10-CM | POA: Diagnosis not present

## 2016-04-12 DIAGNOSIS — R350 Frequency of micturition: Secondary | ICD-10-CM | POA: Diagnosis not present

## 2016-04-12 DIAGNOSIS — Z23 Encounter for immunization: Secondary | ICD-10-CM | POA: Diagnosis not present

## 2016-04-12 DIAGNOSIS — Q059 Spina bifida, unspecified: Secondary | ICD-10-CM | POA: Diagnosis not present

## 2016-04-12 DIAGNOSIS — E559 Vitamin D deficiency, unspecified: Secondary | ICD-10-CM | POA: Diagnosis not present

## 2016-04-12 DIAGNOSIS — Z0000 Encounter for general adult medical examination without abnormal findings: Secondary | ICD-10-CM | POA: Diagnosis not present

## 2016-04-18 DIAGNOSIS — N319 Neuromuscular dysfunction of bladder, unspecified: Secondary | ICD-10-CM | POA: Diagnosis not present

## 2016-04-18 DIAGNOSIS — R350 Frequency of micturition: Secondary | ICD-10-CM | POA: Diagnosis not present

## 2016-04-18 DIAGNOSIS — N3020 Other chronic cystitis without hematuria: Secondary | ICD-10-CM | POA: Diagnosis not present

## 2016-05-09 ENCOUNTER — Encounter: Payer: Medicare Other | Attending: Physical Medicine & Rehabilitation | Admitting: Physical Medicine & Rehabilitation

## 2016-05-09 ENCOUNTER — Encounter: Payer: Self-pay | Admitting: Physical Medicine & Rehabilitation

## 2016-05-09 ENCOUNTER — Encounter (INDEPENDENT_AMBULATORY_CARE_PROVIDER_SITE_OTHER): Payer: Self-pay

## 2016-05-09 VITALS — BP 127/78 | HR 82 | Resp 14

## 2016-05-09 DIAGNOSIS — G8929 Other chronic pain: Secondary | ICD-10-CM | POA: Diagnosis not present

## 2016-05-09 DIAGNOSIS — Z96652 Presence of left artificial knee joint: Secondary | ICD-10-CM | POA: Diagnosis not present

## 2016-05-09 DIAGNOSIS — R531 Weakness: Secondary | ICD-10-CM | POA: Insufficient documentation

## 2016-05-09 DIAGNOSIS — M1712 Unilateral primary osteoarthritis, left knee: Secondary | ICD-10-CM | POA: Diagnosis not present

## 2016-05-09 DIAGNOSIS — N319 Neuromuscular dysfunction of bladder, unspecified: Secondary | ICD-10-CM | POA: Insufficient documentation

## 2016-05-09 DIAGNOSIS — Z8249 Family history of ischemic heart disease and other diseases of the circulatory system: Secondary | ICD-10-CM | POA: Insufficient documentation

## 2016-05-09 DIAGNOSIS — Q059 Spina bifida, unspecified: Secondary | ICD-10-CM | POA: Insufficient documentation

## 2016-05-09 DIAGNOSIS — S8992XS Unspecified injury of left lower leg, sequela: Secondary | ICD-10-CM | POA: Diagnosis not present

## 2016-05-09 NOTE — Progress Notes (Signed)
Subjective:    Patient ID: Mary Neal, female    DOB: [birth year], 50 y.o.   MRN: [id number]  HPI  Mary Neal is here in follow up of her chronic pain and gait disorder. She continues to do quite well. She is walking daily. She tries not to "over-do" things but is walking quite a lot. She uses her loftstrand crutches for support. Ortho has requested repairs be made to her crutches. The leather padding is coming loose.   She is off all narcotics curently.   Pain Inventory Average Pain 0 Pain Right Now 0 My pain is n/a  In the last 24 hours, has pain interfered with the following? General activity 0 Relation with others 0 Enjoyment of life 0 What TIME of day is your pain at its worst? no pain Sleep (in general) Good  Pain is worse with: no pain Pain improves with: no pain Relief from Meds: no pain  Mobility walk with assistance  Function Do you have any goals in this area?  no  Neuro/Psych bladder control problems bowel control problems  Prior Studies Any changes since last visit?  no  Physicians involved in your care Any changes since last visit?  no   Family History  Problem Relation Age of Onset  . Hypertension Mother    Social History   Social History  . Marital status: Single    Spouse name: N/A  . Number of children: N/A  . Years of education: N/A   Social History Main Topics  . Smoking status: Never Smoker  . Smokeless tobacco: Never Used  . Alcohol use No  . Drug use: No  . Sexual activity: No   Other Topics Concern  . None   Social History Narrative   ** Merged History Encounter **       No past surgical history on file. Past Medical History:  Diagnosis Date  . Abnormal Pap smear 2013  . Spina bifida ([place])   . Uterine prolapse   . Wears glasses    BP 127/78   Pulse 82   Resp 14   SpO2 96%   Opioid Risk Score:   Fall Risk Score:  `1  Depression screen PHQ 2/9  Depression screen [facility] 2/9 04/13/2015 03/12/2015 02/13/2015  09/26/2014  Decreased Interest 1 0 1 1  Down, Depressed, Hopeless 1 0 1 1  PHQ - 2 Score 2 0 2 2  Altered sleeping - 1 - 0  Tired, decreased energy - 0 - 1  Change in appetite - 0 - 0  Feeling bad or failure about yourself  - 0 - 0  Trouble concentrating - 0 - 0  Moving slowly or fidgety/restless - 1 - 0  Suicidal thoughts - 0 - 0  PHQ-9 Score - 2 - 3   Review of Systems  All other systems reviewed and are negative.      Objective:   Physical Exam  General: Alert and oriented x 3, No apparent distress  HEENT: Head is normocephalic, atraumatic, PERRLA, EOMI, sclera anicteric, oral mucosa pink and moist, dentition intact, ext ear canals clear,  Neck: Supple without JVD or lymphadenopathy  Heart: Reg rate and rhythm. No murmurs rubs or gallops  Chest: CTA bilaterally without wheezes, rales, or rhonchi; no distress  Abdomen: Soft, non-tender, non-distended, bowel sounds positive.  Extremities: No clubbing, cyanosis, or edema. Pulses are 2+  Skin: Clean and intact without signs of breakdown  Neuro: absent sensation below the lower calf. Profound atrophy  of all distal musculature. She has minimal to no hip extension or knee flexion. Cognitively she's generally appropriate, CN notable for dysconjugate gaze. Hearing seems intact.  Musculoskeletal: much improved knee control in stance---decreased valgus deformity. Still substantially externally rotates hips to help clear the legs during swing phase.   Psych: Pt's affect is appropriate. Pt is cooperative    Assessment & Plan:  1. Spina Bifida with profound distal lower extremity weakness, sensory loss, neurogenic bowel/bladder  2. Left knee pain/ OA with multiple ligament injuries and severe femoral inward rotation and valgus at knee   Plan:  1. S/p left TKA with great results!! Discussed the fact that she still needs to be reasonable about her activities. She should use a w/c for longer distances in the community.  3. Tylenol prn for  pain.  5. Follow up PRN. 15 minutes of face to face patient care time were spent during this visit. All questions were encouraged and answered.

## 2016-05-09 NOTE — Patient Instructions (Signed)
DON'T OVER DO THINGS!!!!!   YOU'RE DOING A GREAT JOB!!

## 2016-06-23 DIAGNOSIS — Z1231 Encounter for screening mammogram for malignant neoplasm of breast: Secondary | ICD-10-CM | POA: Diagnosis not present

## 2016-10-05 DIAGNOSIS — Z96652 Presence of left artificial knee joint: Secondary | ICD-10-CM | POA: Diagnosis not present

## 2016-10-05 DIAGNOSIS — Z09 Encounter for follow-up examination after completed treatment for conditions other than malignant neoplasm: Secondary | ICD-10-CM | POA: Diagnosis not present

## 2016-10-05 DIAGNOSIS — Z471 Aftercare following joint replacement surgery: Secondary | ICD-10-CM | POA: Diagnosis not present

## 2016-10-26 IMAGING — CT CT ABD-PELV W/ CM
2 of 5 series · 15 of 46 positions shown, 17 images · IV contrast (Omni 300)
Comparison: Abdominal radiograph January 13, 2012

CLINICAL DATA: Periumbilical abdominal pain and nausea. History of
spina bifida.

EXAM:
CT ABDOMEN AND PELVIS WITH CONTRAST
TECHNIQUE: Multidetector CT imaging of the abdomen and pelvis was performed
using the standard protocol following bolus administration of
intravenous contrast.
CONTRAST:  100mL OMNIPAQUE IOHEXOL 300 MG/ML  SOLN

[Series 2: a/p w/ 5mm · axial · 0.54mm/px · z∈[+938,+1308]mm · 12 of 84 slices shown, 14 images]
[im 5/84  soft-tissue]
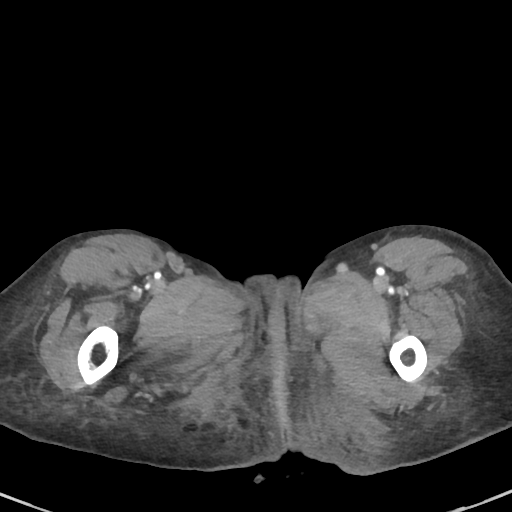
[im 5/84  bone]
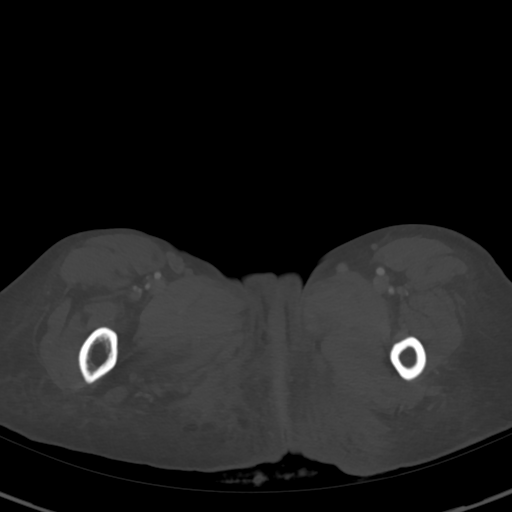
[im 13/84  soft-tissue]
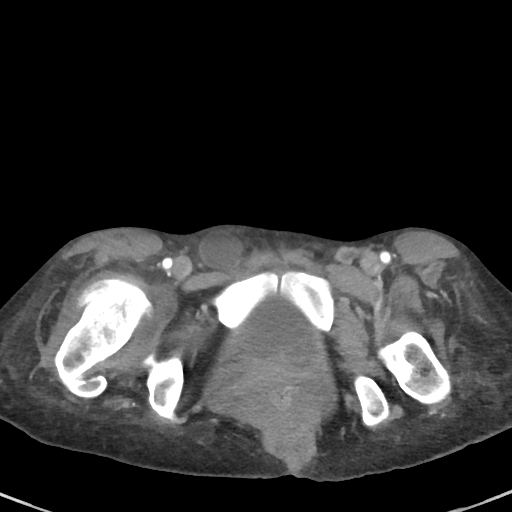
[im 17/84  soft-tissue]
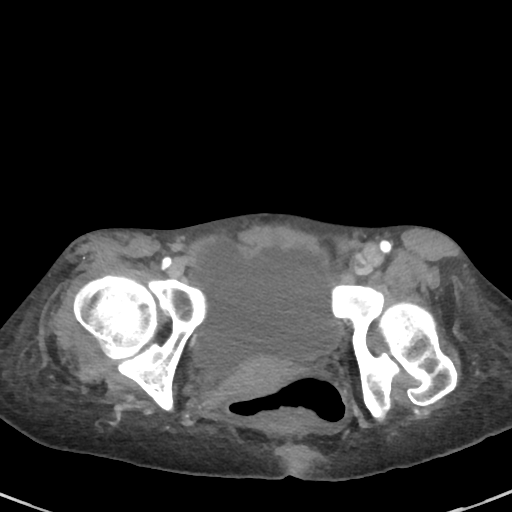
[im 25/84  soft-tissue]
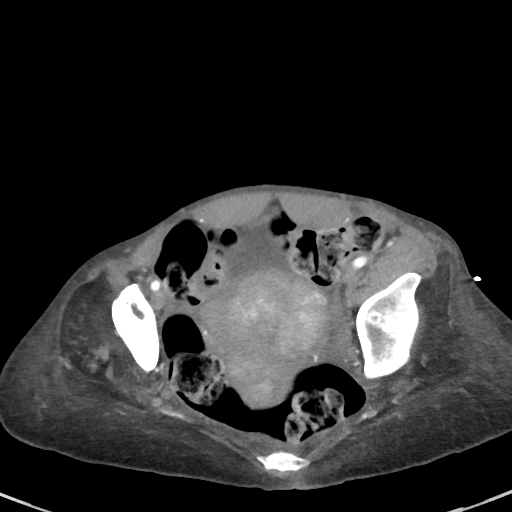
[im 34/84  soft-tissue]
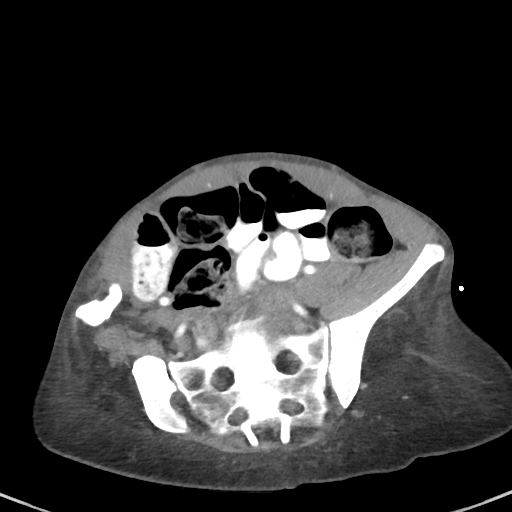
[im 38/84  soft-tissue]
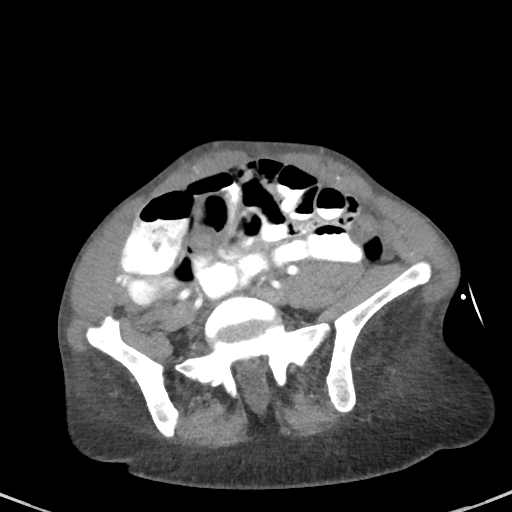
[im 46/84  soft-tissue]
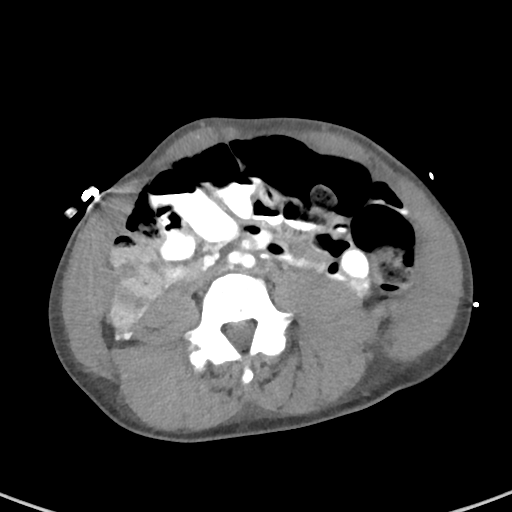
[im 50/84  soft-tissue]
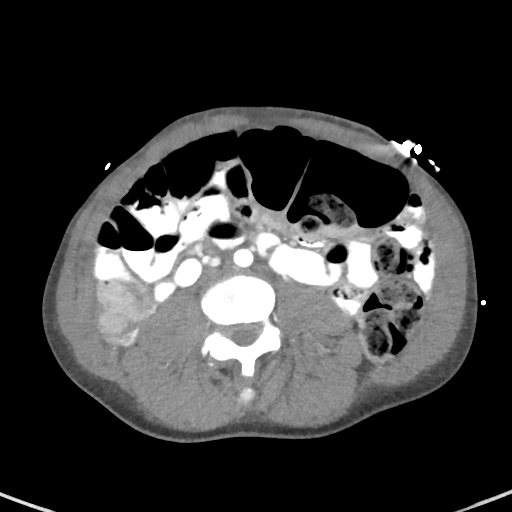
[im 59/84  soft-tissue]
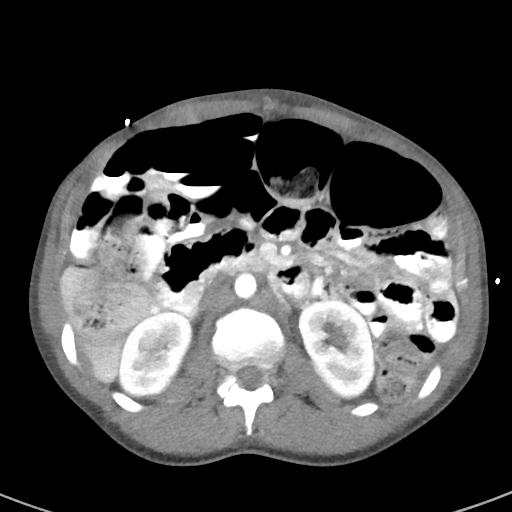
[im 59/84  bone]
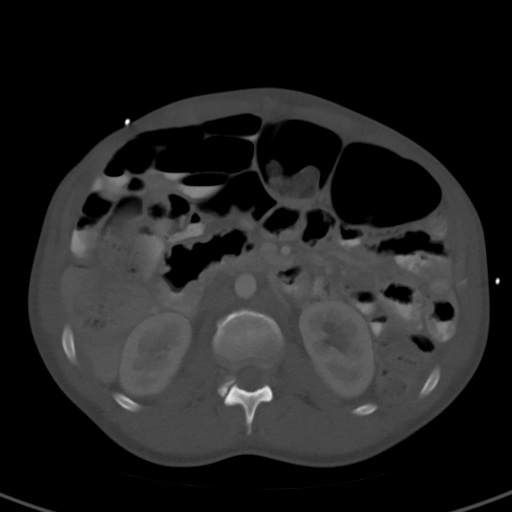
[im 67/84  soft-tissue]
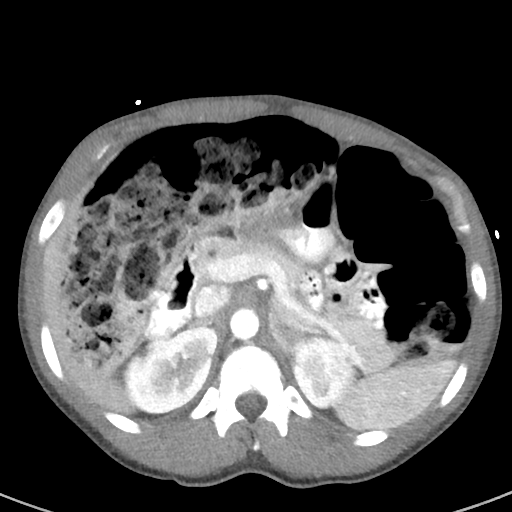
[im 71/84  soft-tissue]
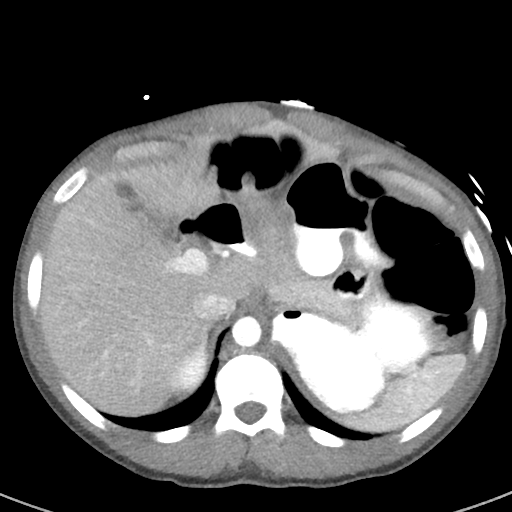
[im 79/84  soft-tissue]
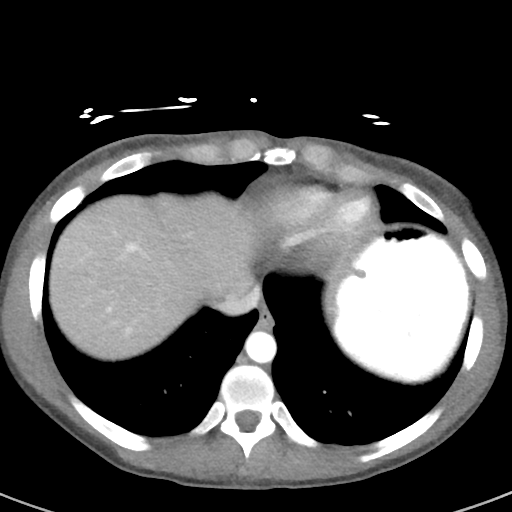

[Series 5: a/p w/ cor · coronal · 0.58mm/px · 3 of 110 slices shown]
[im 37/110  soft-tissue]
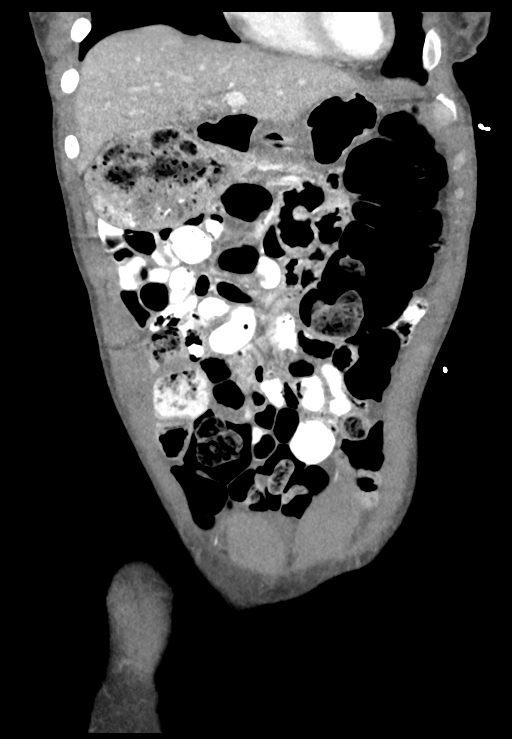
[im 49/110  soft-tissue]
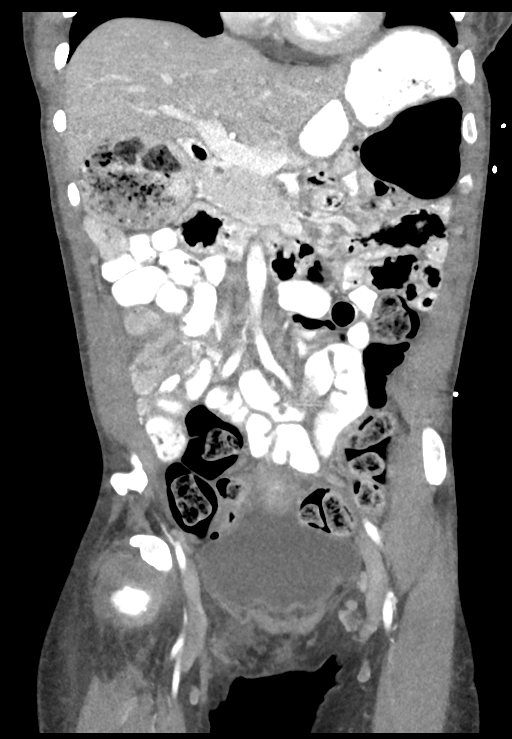
[im 61/110  soft-tissue]
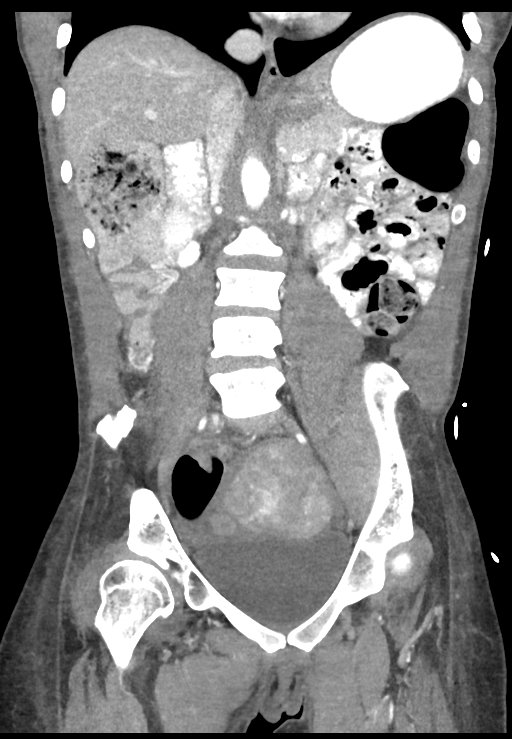

[15 of 46 positions shown; findings below may reference images not displayed]

FINDINGS: LUNG BASES: Included view of the lung bases are clear. Visualized
heart and pericardium are unremarkable.

SOLID ORGANS: The liver demonstrates subcentimeter cyst in segment
7, otherwise unremarkable. Spleen, gallbladder, pancreas and adrenal
glands are unremarkable.

GASTROINTESTINAL TRACT: Large amount of retained large bowel stool.
The stomach, small bowel are normal in course and caliber without
inflammatory changes. The appendix is not discretely identified,
however there are no inflammatory changes in the right lower
quadrant.

KIDNEYS/ URINARY TRACT: Kidneys are orthotopic, demonstrating
symmetric enhancement. No nephrolithiasis, hydronephrosis or solid
renal masses. Focal bilateral interpolar scarring. The unopacified
ureters are normal in course and caliber. Delayed imaging through
the kidneys demonstrates symmetric prompt contrast excretion within
the proximal urinary collecting system. Urinary bladder is well
distended, with anterior aspect of the bladder herniated into RIGHT
inguinal canal with punctate calcification.

PERITONEUM/RETROPERITONEUM: Aortoiliac vessels are normal in course
and caliber. Flattened inferior vena cava. No lymphadenopathy by CT
size criteria. Enlarged heterogeneous uterus compatible with
leiomyomas. No intraperitoneal free fluid nor free air.

SOFT TISSUE/OSSEOUS STRUCTURES: Bilateral severe gluteal muscle
atrophy. Deformity of the RIGHT iliac crest and, shallow RIGHT
acetabulum compatible with hip dysplasia. Spina bifida noted.
Anterior abdominal wall scarring and suture material.
IMPRESSION: Large amount of retained large bowel stool without bowel
obstruction.

Flattened inferior vena cava can be seen with hypovolemia, recommend
correlation with blood pressure.

Leiomyomata is uterus.

Spina bifida.

## 2016-11-07 DIAGNOSIS — N390 Urinary tract infection, site not specified: Secondary | ICD-10-CM | POA: Diagnosis not present

## 2016-11-30 DIAGNOSIS — Q059 Spina bifida, unspecified: Secondary | ICD-10-CM | POA: Diagnosis not present

## 2016-11-30 DIAGNOSIS — N319 Neuromuscular dysfunction of bladder, unspecified: Secondary | ICD-10-CM | POA: Diagnosis not present

## 2016-11-30 DIAGNOSIS — N3941 Urge incontinence: Secondary | ICD-10-CM | POA: Diagnosis not present

## 2016-11-30 DIAGNOSIS — R319 Hematuria, unspecified: Secondary | ICD-10-CM | POA: Diagnosis not present

## 2016-11-30 DIAGNOSIS — N270 Small kidney, unilateral: Secondary | ICD-10-CM | POA: Diagnosis not present

## 2016-11-30 DIAGNOSIS — Z79899 Other long term (current) drug therapy: Secondary | ICD-10-CM | POA: Diagnosis not present

## 2016-11-30 DIAGNOSIS — Z880 Allergy status to penicillin: Secondary | ICD-10-CM | POA: Diagnosis not present

## 2016-11-30 DIAGNOSIS — Z87440 Personal history of urinary (tract) infections: Secondary | ICD-10-CM | POA: Diagnosis not present

## 2017-03-08 DIAGNOSIS — F4322 Adjustment disorder with anxiety: Secondary | ICD-10-CM | POA: Diagnosis not present

## 2017-03-15 DIAGNOSIS — F4322 Adjustment disorder with anxiety: Secondary | ICD-10-CM | POA: Diagnosis not present

## 2017-03-28 DIAGNOSIS — F4322 Adjustment disorder with anxiety: Secondary | ICD-10-CM | POA: Diagnosis not present

## 2017-04-12 DIAGNOSIS — F4322 Adjustment disorder with anxiety: Secondary | ICD-10-CM | POA: Diagnosis not present

## 2017-04-17 DIAGNOSIS — N3020 Other chronic cystitis without hematuria: Secondary | ICD-10-CM | POA: Diagnosis not present

## 2017-04-25 DIAGNOSIS — K592 Neurogenic bowel, not elsewhere classified: Secondary | ICD-10-CM | POA: Diagnosis not present

## 2017-04-25 DIAGNOSIS — R269 Unspecified abnormalities of gait and mobility: Secondary | ICD-10-CM | POA: Diagnosis not present

## 2017-04-25 DIAGNOSIS — N319 Neuromuscular dysfunction of bladder, unspecified: Secondary | ICD-10-CM | POA: Diagnosis not present

## 2017-04-25 DIAGNOSIS — Q059 Spina bifida, unspecified: Secondary | ICD-10-CM | POA: Diagnosis not present

## 2017-05-10 DIAGNOSIS — N319 Neuromuscular dysfunction of bladder, unspecified: Secondary | ICD-10-CM | POA: Diagnosis not present

## 2017-05-16 DIAGNOSIS — E559 Vitamin D deficiency, unspecified: Secondary | ICD-10-CM | POA: Diagnosis not present

## 2017-05-16 DIAGNOSIS — R7303 Prediabetes: Secondary | ICD-10-CM | POA: Diagnosis not present

## 2017-05-16 DIAGNOSIS — Z0000 Encounter for general adult medical examination without abnormal findings: Secondary | ICD-10-CM | POA: Diagnosis not present

## 2017-05-16 DIAGNOSIS — Z23 Encounter for immunization: Secondary | ICD-10-CM | POA: Diagnosis not present

## 2017-05-16 DIAGNOSIS — R7309 Other abnormal glucose: Secondary | ICD-10-CM | POA: Diagnosis not present

## 2017-05-16 DIAGNOSIS — N319 Neuromuscular dysfunction of bladder, unspecified: Secondary | ICD-10-CM | POA: Diagnosis not present

## 2017-05-16 DIAGNOSIS — R799 Abnormal finding of blood chemistry, unspecified: Secondary | ICD-10-CM | POA: Diagnosis not present

## 2017-05-16 DIAGNOSIS — Q059 Spina bifida, unspecified: Secondary | ICD-10-CM | POA: Diagnosis not present

## 2017-05-16 DIAGNOSIS — N390 Urinary tract infection, site not specified: Secondary | ICD-10-CM | POA: Diagnosis not present

## 2017-06-26 DIAGNOSIS — Z1231 Encounter for screening mammogram for malignant neoplasm of breast: Secondary | ICD-10-CM | POA: Diagnosis not present

## 2017-07-12 DIAGNOSIS — F4322 Adjustment disorder with anxiety: Secondary | ICD-10-CM | POA: Diagnosis not present

## 2017-07-27 DIAGNOSIS — F4322 Adjustment disorder with anxiety: Secondary | ICD-10-CM | POA: Diagnosis not present

## 2017-08-08 DIAGNOSIS — F4322 Adjustment disorder with anxiety: Secondary | ICD-10-CM | POA: Diagnosis not present

## 2017-09-06 DIAGNOSIS — F4322 Adjustment disorder with anxiety: Secondary | ICD-10-CM | POA: Diagnosis not present

## 2017-10-04 DIAGNOSIS — Z880 Allergy status to penicillin: Secondary | ICD-10-CM | POA: Diagnosis not present

## 2017-10-04 DIAGNOSIS — Z471 Aftercare following joint replacement surgery: Secondary | ICD-10-CM | POA: Diagnosis not present

## 2017-10-04 DIAGNOSIS — Z91040 Latex allergy status: Secondary | ICD-10-CM | POA: Diagnosis not present

## 2017-10-04 DIAGNOSIS — Z96652 Presence of left artificial knee joint: Secondary | ICD-10-CM | POA: Diagnosis not present

## 2017-10-11 DIAGNOSIS — F4322 Adjustment disorder with anxiety: Secondary | ICD-10-CM | POA: Diagnosis not present

## 2017-11-07 DIAGNOSIS — F4322 Adjustment disorder with anxiety: Secondary | ICD-10-CM | POA: Diagnosis not present

## 2017-11-21 DIAGNOSIS — Z23 Encounter for immunization: Secondary | ICD-10-CM | POA: Diagnosis not present

## 2017-11-21 DIAGNOSIS — F4322 Adjustment disorder with anxiety: Secondary | ICD-10-CM | POA: Diagnosis not present

## 2017-11-21 DIAGNOSIS — Q059 Spina bifida, unspecified: Secondary | ICD-10-CM | POA: Diagnosis not present

## 2017-11-21 DIAGNOSIS — N319 Neuromuscular dysfunction of bladder, unspecified: Secondary | ICD-10-CM | POA: Diagnosis not present

## 2017-11-21 DIAGNOSIS — Z79899 Other long term (current) drug therapy: Secondary | ICD-10-CM | POA: Diagnosis not present

## 2017-11-28 DIAGNOSIS — N270 Small kidney, unilateral: Secondary | ICD-10-CM | POA: Diagnosis not present

## 2017-11-28 DIAGNOSIS — N3941 Urge incontinence: Secondary | ICD-10-CM | POA: Diagnosis not present

## 2017-11-28 DIAGNOSIS — D259 Leiomyoma of uterus, unspecified: Secondary | ICD-10-CM | POA: Diagnosis not present

## 2017-11-28 DIAGNOSIS — Z880 Allergy status to penicillin: Secondary | ICD-10-CM | POA: Diagnosis not present

## 2017-11-28 DIAGNOSIS — Z91018 Allergy to other foods: Secondary | ICD-10-CM | POA: Diagnosis not present

## 2017-11-28 DIAGNOSIS — Z96649 Presence of unspecified artificial hip joint: Secondary | ICD-10-CM | POA: Diagnosis not present

## 2017-11-28 DIAGNOSIS — N323 Diverticulum of bladder: Secondary | ICD-10-CM | POA: Diagnosis not present

## 2017-11-28 DIAGNOSIS — N319 Neuromuscular dysfunction of bladder, unspecified: Secondary | ICD-10-CM | POA: Diagnosis not present

## 2017-11-28 DIAGNOSIS — Q059 Spina bifida, unspecified: Secondary | ICD-10-CM | POA: Diagnosis not present

## 2017-12-12 DIAGNOSIS — F4323 Adjustment disorder with mixed anxiety and depressed mood: Secondary | ICD-10-CM | POA: Diagnosis not present

## 2018-01-09 DIAGNOSIS — F4323 Adjustment disorder with mixed anxiety and depressed mood: Secondary | ICD-10-CM | POA: Diagnosis not present

## 2018-02-08 DIAGNOSIS — R12 Heartburn: Secondary | ICD-10-CM | POA: Diagnosis not present

## 2018-03-06 DIAGNOSIS — F4323 Adjustment disorder with mixed anxiety and depressed mood: Secondary | ICD-10-CM | POA: Diagnosis not present

## 2018-03-20 DIAGNOSIS — F4323 Adjustment disorder with mixed anxiety and depressed mood: Secondary | ICD-10-CM | POA: Diagnosis not present

## 2018-03-23 DIAGNOSIS — R12 Heartburn: Secondary | ICD-10-CM | POA: Diagnosis not present

## 2018-03-23 DIAGNOSIS — M25561 Pain in right knee: Secondary | ICD-10-CM | POA: Diagnosis not present

## 2018-04-03 ENCOUNTER — Telehealth (INDEPENDENT_AMBULATORY_CARE_PROVIDER_SITE_OTHER): Payer: Self-pay | Admitting: Radiology

## 2018-04-03 ENCOUNTER — Ambulatory Visit (INDEPENDENT_AMBULATORY_CARE_PROVIDER_SITE_OTHER): Payer: Medicare Other

## 2018-04-03 ENCOUNTER — Ambulatory Visit (INDEPENDENT_AMBULATORY_CARE_PROVIDER_SITE_OTHER): Payer: Medicare Other | Admitting: Orthopaedic Surgery

## 2018-04-03 ENCOUNTER — Encounter (INDEPENDENT_AMBULATORY_CARE_PROVIDER_SITE_OTHER): Payer: Self-pay | Admitting: Orthopaedic Surgery

## 2018-04-03 DIAGNOSIS — M1711 Unilateral primary osteoarthritis, right knee: Secondary | ICD-10-CM

## 2018-04-03 DIAGNOSIS — M25561 Pain in right knee: Secondary | ICD-10-CM | POA: Diagnosis not present

## 2018-04-03 MED ORDER — LIDOCAINE HCL 1 % IJ SOLN
3.0000 mL | INTRAMUSCULAR | Status: AC | PRN
  Administered 2018-04-03: 3 mL

## 2018-04-03 MED ORDER — METHYLPREDNISOLONE ACETATE 40 MG/ML IJ SUSP
40.0000 mg | INTRAMUSCULAR | Status: AC | PRN
  Administered 2018-04-03: 40 mg via INTRA_ARTICULAR

## 2018-04-03 NOTE — Telephone Encounter (Signed)
Needs approval for hyaluronic acid injection for right knee

## 2018-04-03 NOTE — Progress Notes (Signed)
Office Visit Note   Patient: Mary Neal           Date of Birth: Dec 07, 1965           MRN: [id number] Visit Date: 04/03/2018              Requested by: Conni Slipper, NP [address]  [place], [place] [address] PCP: Conni Slipper, NP   Assessment & Plan: Visit Diagnoses:  1. Right knee pain, unspecified chronicity   2. Unilateral primary osteoarthritis, right knee     Plan: This right knee is certainly better than her left knee in terms of the arthritis and not a severe gross clicking deformity.  She would benefit today from just a steroid injection in that knee and she agrees.  She tolerated it well.  The risks and benefits of injections were explained to her in detail and she is had these before.  She is a perfect candidate for hyaluronic acid injection for her right knee and we will order this for her.  All questions and concerns were answered and addressed.  We will see her back in 4 weeks.  Follow-Up Instructions: Return in about 4 weeks (around 05/01/2018).   Orders:  Orders Placed This Encounter  Procedures  . Large Joint Inj  . XR Knee 1-2 Views Right   No orders of the defined types were placed in this encounter.     Procedures: Large Joint Inj: R knee on 04/03/2018 10:32 AM Indications: diagnostic evaluation and pain Details: 22 G 1.5 in needle, superolateral approach  Arthrogram: No  Medications: 3 mL lidocaine 1 %; 40 mg methylPREDNISolone acetate 40 MG/ML Outcome: tolerated well, no immediate complications Procedure, treatment alternatives, risks and benefits explained, specific risks discussed. Consent was given by the patient. Immediately prior to procedure a time out was called to verify the correct patient, procedure, equipment, support staff and site/side marked as required. Patient was prepped and draped in the usual sterile fashion.       Clinical Data: No additional findings.   Subjective: Chief Complaint  Patient  presents with  . Right Knee - Pain  Patient is well-known to me.  She is a 52 year old female who we have been seeing for some time now.  She has a history of spina bifida.  She actually had a left total knee arthroplasty done over at [facility] due to the complex nature of the issues with her left knee.  That knee has done well for her.  She is been developing some right knee pain with occasionally a catching sensation and sharp pain.  This is been more new to her.  She wears braces on both legs due to her spina bifida.  She denies any specific injury to that knee.  HPI  Review of Systems She currently denies any headache, chest pain, shortness of breath, fever, chills, nausea, vomiting.  Objective: Vital Signs: There were no vitals taken for this visit.  Physical Exam She is alert and oriented x3 and in no acute distress Ortho Exam Examination of her right knee shows that moves fluidly and smooth.  The patella tracks well.  There is no significant deformities like it was on the left side.  She has some slight medial lateral joint line tenderness and some slight clicking of the patellofemoral joint with the knee feels stable overall.  There is no effusion. Specialty Comments:  No specialty comments available.  Imaging: Xr Knee 1-2 Views Right  Result Date:  04/03/2018 2 views of the right knee show no gross deformities.  There is mild to moderate osteoarthritic changes.    PMFS History: Patient Active Problem List   Diagnosis Date Noted  . Osteoarthritis of left knee 06/17/2013  . Spina bifida ([place]) 03/25/2013  . Injury of ligament of left knee 03/25/2013  . Paraparesis ([place]) 03/25/2013  . Tear of medial collateral ligament of knee (left) 03/25/2013  . Fibroids 02/14/2012   Past Medical History:  Diagnosis Date  . Abnormal Pap smear 2013  . Spina bifida ([place])   . Uterine prolapse   . Wears glasses     Family History  Problem Relation Age of Onset  . Hypertension Mother       History reviewed. No pertinent surgical history. Social History   Occupational History  . Not on file  Tobacco Use  . Smoking status: Never Smoker  . Smokeless tobacco: Never Used  Substance and Sexual Activity  . Alcohol use: No    Alcohol/week: 0.0 standard drinks  . Drug use: No  . Sexual activity: Never

## 2018-04-04 NOTE — Telephone Encounter (Signed)
Noted  

## 2018-04-05 ENCOUNTER — Telehealth (INDEPENDENT_AMBULATORY_CARE_PROVIDER_SITE_OTHER): Payer: Self-pay

## 2018-04-05 NOTE — Telephone Encounter (Signed)
Submitted VOB for Monovisc, right knee. 

## 2018-04-10 ENCOUNTER — Telehealth (INDEPENDENT_AMBULATORY_CARE_PROVIDER_SITE_OTHER): Payer: Self-pay

## 2018-04-10 NOTE — Telephone Encounter (Signed)
Patient approved for Monovisc, right knee. Buy & Bill Covered at 100% through insurance No PA required No Co-pay  Appt.scheduled 05/01/2018

## 2018-04-18 DIAGNOSIS — F4325 Adjustment disorder with mixed disturbance of emotions and conduct: Secondary | ICD-10-CM | POA: Diagnosis not present

## 2018-04-19 DIAGNOSIS — F4323 Adjustment disorder with mixed anxiety and depressed mood: Secondary | ICD-10-CM | POA: Diagnosis not present

## 2018-05-01 ENCOUNTER — Ambulatory Visit (INDEPENDENT_AMBULATORY_CARE_PROVIDER_SITE_OTHER): Payer: Medicare Other | Admitting: Orthopaedic Surgery

## 2018-05-02 ENCOUNTER — Ambulatory Visit (INDEPENDENT_AMBULATORY_CARE_PROVIDER_SITE_OTHER): Payer: Medicare Other | Admitting: Orthopaedic Surgery

## 2018-05-02 ENCOUNTER — Encounter (INDEPENDENT_AMBULATORY_CARE_PROVIDER_SITE_OTHER): Payer: Self-pay | Admitting: Orthopaedic Surgery

## 2018-05-02 DIAGNOSIS — G8929 Other chronic pain: Secondary | ICD-10-CM | POA: Diagnosis not present

## 2018-05-02 DIAGNOSIS — M25561 Pain in right knee: Secondary | ICD-10-CM | POA: Diagnosis not present

## 2018-05-02 DIAGNOSIS — M1711 Unilateral primary osteoarthritis, right knee: Secondary | ICD-10-CM | POA: Diagnosis not present

## 2018-05-02 MED ORDER — HYALURONAN 88 MG/4ML IX SOSY
88.0000 mg | PREFILLED_SYRINGE | INTRA_ARTICULAR | Status: AC | PRN
  Administered 2018-05-02: 88 mg via INTRA_ARTICULAR

## 2018-05-02 NOTE — Progress Notes (Signed)
   Procedure Note  Patient: Mary Neal             Date of Birth: Mar 02, 1966           MRN: [id number]             Visit Date: 05/02/2018  Procedures: Visit Diagnoses: Unilateral primary osteoarthritis, right knee  Chronic pain of right knee  Large Joint Inj: R knee on 05/02/2018 3:53 PM Indications: diagnostic evaluation and pain Details: 22 G 1.5 in needle, superolateral approach  Arthrogram: No  Medications: 88 mg Hyaluronan 88 MG/4ML Outcome: tolerated well, no immediate complications Procedure, treatment alternatives, risks and benefits explained, specific risks discussed. Consent was given by the patient. Immediately prior to procedure a time out was called to verify the correct patient, procedure, equipment, support staff and site/side marked as required. Patient was prepped and draped in the usual sterile fashion.    She comes in today for scheduled Monovisc injection of hyaluronic acid in her right knee.  She has had a previous left total knee arthroplasty that was done elsewhere due to the severe deformity in her knee as a relates to spina bifida and other congenital deformities.  She does wear braces on both legs.  She does report significant right knee pain and her previous steroid injections did not help.  The x-rays of her knee still showed a pretty well-maintained joint space.  I did place hyaluronic acid with Monovisc in the right knee today.  I would like to see her back in just 3 weeks.  If she still having pain in that knee and quad area will need to pursue the possibility of an MRI of the right knee.

## 2018-05-03 DIAGNOSIS — R310 Gross hematuria: Secondary | ICD-10-CM | POA: Diagnosis not present

## 2018-05-03 DIAGNOSIS — N201 Calculus of ureter: Secondary | ICD-10-CM | POA: Diagnosis not present

## 2018-05-03 DIAGNOSIS — N319 Neuromuscular dysfunction of bladder, unspecified: Secondary | ICD-10-CM | POA: Diagnosis not present

## 2018-05-03 DIAGNOSIS — R350 Frequency of micturition: Secondary | ICD-10-CM | POA: Diagnosis not present

## 2018-05-09 DIAGNOSIS — F4323 Adjustment disorder with mixed anxiety and depressed mood: Secondary | ICD-10-CM | POA: Diagnosis not present

## 2018-05-15 DIAGNOSIS — F4325 Adjustment disorder with mixed disturbance of emotions and conduct: Secondary | ICD-10-CM | POA: Diagnosis not present

## 2018-05-23 ENCOUNTER — Other Ambulatory Visit (INDEPENDENT_AMBULATORY_CARE_PROVIDER_SITE_OTHER): Payer: Self-pay

## 2018-05-23 ENCOUNTER — Ambulatory Visit (INDEPENDENT_AMBULATORY_CARE_PROVIDER_SITE_OTHER): Payer: Medicare Other | Admitting: Orthopaedic Surgery

## 2018-05-23 ENCOUNTER — Encounter (INDEPENDENT_AMBULATORY_CARE_PROVIDER_SITE_OTHER): Payer: Self-pay | Admitting: Orthopaedic Surgery

## 2018-05-23 DIAGNOSIS — G8929 Other chronic pain: Secondary | ICD-10-CM | POA: Diagnosis not present

## 2018-05-23 DIAGNOSIS — M25561 Pain in right knee: Principal | ICD-10-CM

## 2018-05-23 NOTE — Progress Notes (Signed)
The patient is well-known to [place].  She has a history of spina bifida and a history of a complex revision knee replacement done as a primary knee of her left knee a few years ago due to a severe deformity that knee.  She is been developing right knee pain and is gotten worse.  We have tried steroid injections and hyaluronic acid injections in the right knee.  Her x-rays show mild to moderate arthritic changes but nothing compared to her other knee in terms of deformity.  On exam the knee moves fluidly and there is only minimal patellofemoral crepitation but global pain.  At this point I would like to obtain an MRI of her right knee given the fact that she is having significant amount of pain with mobility with also some locking catching now and she is failed all forms conservative treatment including multiple injections.  We will work on getting this MRI scheduled and I will see her back in 2 weeks ago with MRI.  At that visit I would also like a pelvis x-ray which can be done supine and a lateral of her right hip.

## 2018-05-24 ENCOUNTER — Telehealth: Payer: Self-pay | Admitting: *Deleted

## 2018-05-24 NOTE — Telephone Encounter (Signed)
Mary Neal called reqeusting for Mary Neal to call her.  [phone]

## 2018-05-28 NOTE — Telephone Encounter (Signed)
Return [name] call, no answer, lef message to return the call.

## 2018-06-06 ENCOUNTER — Ambulatory Visit (INDEPENDENT_AMBULATORY_CARE_PROVIDER_SITE_OTHER): Payer: Medicare Other | Admitting: Orthopaedic Surgery

## 2018-06-06 ENCOUNTER — Encounter (INDEPENDENT_AMBULATORY_CARE_PROVIDER_SITE_OTHER): Payer: Self-pay | Admitting: Orthopaedic Surgery

## 2018-06-06 ENCOUNTER — Ambulatory Visit
Admission: RE | Admit: 2018-06-06 | Discharge: 2018-06-06 | Disposition: A | Discharge status: Home / Self Care | Payer: Medicare Other | Source: Ambulatory Visit | Attending: Orthopaedic Surgery | Admitting: Orthopaedic Surgery

## 2018-06-06 DIAGNOSIS — M25561 Pain in right knee: Secondary | ICD-10-CM

## 2018-06-06 DIAGNOSIS — Q6589 Other specified congenital deformities of hip: Secondary | ICD-10-CM | POA: Diagnosis not present

## 2018-06-06 DIAGNOSIS — G8929 Other chronic pain: Secondary | ICD-10-CM
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
## 2018-06-09 DIAGNOSIS — S73004A Unspecified dislocation of right hip, initial encounter: Secondary | ICD-10-CM

## 2018-06-09 HISTORY — DX: Unspecified dislocation of right hip, initial encounter: S73.004A

## 2018-06-13 ENCOUNTER — Other Ambulatory Visit (INDEPENDENT_AMBULATORY_CARE_PROVIDER_SITE_OTHER): Payer: Self-pay | Admitting: Orthopaedic Surgery

## 2018-06-13 ENCOUNTER — Ambulatory Visit (INDEPENDENT_AMBULATORY_CARE_PROVIDER_SITE_OTHER): Payer: Medicare Other | Admitting: Orthopaedic Surgery

## 2018-06-13 ENCOUNTER — Encounter (INDEPENDENT_AMBULATORY_CARE_PROVIDER_SITE_OTHER): Payer: Self-pay | Admitting: Orthopaedic Surgery

## 2018-06-13 ENCOUNTER — Ambulatory Visit (INDEPENDENT_AMBULATORY_CARE_PROVIDER_SITE_OTHER): Payer: Medicare Other

## 2018-06-13 ENCOUNTER — Other Ambulatory Visit (INDEPENDENT_AMBULATORY_CARE_PROVIDER_SITE_OTHER): Payer: Self-pay

## 2018-06-13 DIAGNOSIS — M25551 Pain in right hip: Secondary | ICD-10-CM

## 2018-06-13 DIAGNOSIS — Q6589 Other specified congenital deformities of hip: Secondary | ICD-10-CM | POA: Diagnosis not present

## 2018-06-13 DIAGNOSIS — M79651 Pain in right thigh: Secondary | ICD-10-CM

## 2018-06-13 MED ORDER — TRAMADOL HCL 50 MG PO TABS: 50.0000 mg | ORAL_TABLET | Freq: Four times a day (QID) | ORAL | 0 refills | Status: DC | PRN
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
## 2018-06-19 ENCOUNTER — Ambulatory Visit (INDEPENDENT_AMBULATORY_CARE_PROVIDER_SITE_OTHER): Payer: Medicare Other | Admitting: Internal Medicine

## 2018-06-19 ENCOUNTER — Telehealth (INDEPENDENT_AMBULATORY_CARE_PROVIDER_SITE_OTHER): Payer: Self-pay | Admitting: Orthopaedic Surgery

## 2018-06-19 ENCOUNTER — Encounter: Payer: Self-pay | Admitting: Internal Medicine

## 2018-06-19 ENCOUNTER — Ambulatory Visit: Payer: Medicare Other | Admitting: Internal Medicine

## 2018-06-19 VITALS — BP 116/66 | HR 69 | Temp 97.8°F | Ht <= 58 in | Wt 104.0 lb

## 2018-06-19 DIAGNOSIS — D508 Other iron deficiency anemias: Secondary | ICD-10-CM

## 2018-06-19 DIAGNOSIS — X16XXXA Contact with hot heating appliances, radiators and pipes, initial encounter: Secondary | ICD-10-CM

## 2018-06-19 DIAGNOSIS — R319 Hematuria, unspecified: Secondary | ICD-10-CM | POA: Diagnosis not present

## 2018-06-19 DIAGNOSIS — N390 Urinary tract infection, site not specified: Secondary | ICD-10-CM

## 2018-06-19 DIAGNOSIS — Z0000 Encounter for general adult medical examination without abnormal findings: Secondary | ICD-10-CM

## 2018-06-19 DIAGNOSIS — Z23 Encounter for immunization: Secondary | ICD-10-CM

## 2018-06-19 DIAGNOSIS — S81801A Unspecified open wound, right lower leg, initial encounter: Secondary | ICD-10-CM | POA: Diagnosis not present

## 2018-06-19 LAB — POCT URINALYSIS DIPSTICK
Bilirubin, UA: NEGATIVE
Glucose, UA: NEGATIVE
Ketones, UA: NEGATIVE
Nitrite, UA: POSITIVE
Protein, UA: NEGATIVE
Spec Grav, UA: 1.02 (ref 1.010–1.025)
Urobilinogen, UA: 0.2 E.U./dL
pH, UA: 7 (ref 5.0–8.0)

## 2018-06-19 MED ORDER — TRAMADOL HCL 50 MG PO TABS: 50.0000 mg | ORAL_TABLET | Freq: Four times a day (QID) | ORAL | 0 refills | Status: DC | PRN

## 2018-06-19 MED ORDER — CIPROFLOXACIN HCL 500 MG PO TABS: 500.0000 mg | ORAL_TABLET | Freq: Two times a day (BID) | ORAL | 0 refills | Status: AC
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
## 2018-06-19 NOTE — Telephone Encounter (Signed)
Lewis from [facility] called for [facility] in regards to the patient's RX they received.  He stated that they need a revised RX that actually shows it is for a transport chair.  CB#[phone].  Thank you.

## 2018-06-19 NOTE — Patient Instructions (Signed)
  Mary Neal , Thank you for taking time to come for your Medicare Wellness Visit. I appreciate your ongoing commitment to your health goals. Please review the following plan we discussed and let me know if I can assist you in the future.   These are the goals we discussed: Goals   None     Getting back to her being able to do sit ups This is a list of the screening recommended for you and due dates:  Health Maintenance  Topic Date Due  . HIV Screening  02/02/1980  . Tetanus Vaccine  02/02/1984  . Pap Smear  02/01/1986  . Mammogram  02/02/2015  . Colon Cancer Screening  02/02/2015  . Flu Shot  02/08/2018

## 2018-06-20 DIAGNOSIS — N390 Urinary tract infection, site not specified: Secondary | ICD-10-CM | POA: Diagnosis not present

## 2018-06-20 DIAGNOSIS — R319 Hematuria, unspecified: Secondary | ICD-10-CM | POA: Diagnosis not present

## 2018-06-20 LAB — CMP14 + ANION GAP
ALT: 18 IU/L (ref 0–32)
AST: 22 IU/L (ref 0–40)
Albumin/Globulin Ratio: 1.5 (ref 1.2–2.2)
Albumin: 4 g/dL (ref 3.5–5.5)
Alkaline Phosphatase: 77 IU/L (ref 39–117)
Anion Gap: 17 mmol/L (ref 10.0–18.0)
BUN/Creatinine Ratio: 26 — ABNORMAL HIGH (ref 9–23)
BUN: 18 mg/dL (ref 6–24)
Bilirubin Total: 0.3 mg/dL (ref 0.0–1.2)
CO2: 22 mmol/L (ref 20–29)
Calcium: 9.7 mg/dL (ref 8.7–10.2)
Chloride: 100 mmol/L (ref 96–106)
Creatinine, Ser: 0.68 mg/dL (ref 0.57–1.00)
GFR calc Af Amer: 115 mL/min/{1.73_m2} (ref >59)
GFR calc non Af Amer: 100 mL/min/{1.73_m2} (ref >59)
Globulin, Total: 2.6 g/dL (ref 1.5–4.5)
Glucose: 72 mg/dL (ref 65–99)
Potassium: 3.9 mmol/L (ref 3.5–5.2)
Sodium: 139 mmol/L (ref 134–144)
Total Protein: 6.6 g/dL (ref 6.0–8.5)

## 2018-06-20 LAB — T3, FREE: T3, Free: 2.6 pg/mL (ref 2.0–4.4)

## 2018-06-20 LAB — CBC
Hematocrit: 36.1 % (ref 34.0–46.6)
Hemoglobin: 12.3 g/dL (ref 11.1–15.9)
MCH: 31.8 pg (ref 26.6–33.0)
MCHC: 34.1 g/dL (ref 31.5–35.7)
MCV: 93 fL (ref 79–97)
Platelets: 169 10*3/uL (ref 150–450)
RBC: 3.87 x10E6/uL (ref 3.77–5.28)
RDW: 12.4 % (ref 12.3–15.4)
WBC: 5.5 10*3/uL (ref 3.4–10.8)

## 2018-06-20 LAB — LIPID PANEL
Chol/HDL Ratio: 2.1 ratio (ref 0.0–4.4)
Cholesterol, Total: 189 mg/dL (ref 100–199)
HDL: 92 mg/dL (ref >39)
LDL Calculated: 89 mg/dL (ref 0–99)
Triglycerides: 38 mg/dL (ref 0–149)
VLDL Cholesterol Cal: 8 mg/dL (ref 5–40)

## 2018-06-20 LAB — TSH: TSH: 0.885 u[IU]/mL (ref 0.450–4.500)

## 2018-06-20 LAB — T4, FREE: Free T4: 1.04 ng/dL (ref 0.82–1.77)

## 2018-06-20 NOTE — Telephone Encounter (Signed)
Please advise 

## 2018-06-20 NOTE — Telephone Encounter (Signed)
[place] for script for a Systems analyst for her.

## 2018-06-20 NOTE — Telephone Encounter (Signed)
Faxed to [facility]  

## 2018-06-21 ENCOUNTER — Encounter: Payer: Self-pay | Admitting: Internal Medicine

## 2018-06-22 LAB — URINE CULTURE

## 2018-06-27 DIAGNOSIS — Z1231 Encounter for screening mammogram for malignant neoplasm of breast: Secondary | ICD-10-CM | POA: Diagnosis not present

## 2018-06-27 LAB — HM MAMMOGRAPHY: HM Mammogram: NORMAL (ref 0–4)

## 2018-07-02 ENCOUNTER — Encounter (HOSPITAL_BASED_OUTPATIENT_CLINIC_OR_DEPARTMENT_OTHER): Payer: Medicare Other | Attending: Internal Medicine

## 2018-07-02 DIAGNOSIS — X16XXXA Contact with hot heating appliances, radiators and pipes, initial encounter: Secondary | ICD-10-CM | POA: Diagnosis not present

## 2018-07-02 DIAGNOSIS — T24211A Burn of second degree of right thigh, initial encounter: Secondary | ICD-10-CM | POA: Diagnosis not present

## 2018-07-02 DIAGNOSIS — Q054 Unspecified spina bifida with hydrocephalus: Secondary | ICD-10-CM | POA: Insufficient documentation

## 2018-07-03 DIAGNOSIS — T24211D Burn of second degree of right thigh, subsequent encounter: Secondary | ICD-10-CM | POA: Diagnosis not present

## 2018-07-03 DIAGNOSIS — X19XXXD Contact with other heat and hot substances, subsequent encounter: Secondary | ICD-10-CM | POA: Diagnosis not present

## 2018-07-03 DIAGNOSIS — R339 Retention of urine, unspecified: Secondary | ICD-10-CM | POA: Diagnosis not present

## 2018-07-03 DIAGNOSIS — G919 Hydrocephalus, unspecified: Secondary | ICD-10-CM | POA: Diagnosis not present

## 2018-07-03 DIAGNOSIS — Q059 Spina bifida, unspecified: Secondary | ICD-10-CM | POA: Diagnosis not present

## 2018-07-03 DIAGNOSIS — M1990 Unspecified osteoarthritis, unspecified site: Secondary | ICD-10-CM | POA: Diagnosis not present

## 2018-07-06 DIAGNOSIS — Q059 Spina bifida, unspecified: Secondary | ICD-10-CM | POA: Diagnosis not present

## 2018-07-06 DIAGNOSIS — R339 Retention of urine, unspecified: Secondary | ICD-10-CM | POA: Diagnosis not present

## 2018-07-06 DIAGNOSIS — X19XXXD Contact with other heat and hot substances, subsequent encounter: Secondary | ICD-10-CM | POA: Diagnosis not present

## 2018-07-06 DIAGNOSIS — T24211D Burn of second degree of right thigh, subsequent encounter: Secondary | ICD-10-CM | POA: Diagnosis not present

## 2018-07-06 DIAGNOSIS — G919 Hydrocephalus, unspecified: Secondary | ICD-10-CM | POA: Diagnosis not present

## 2018-07-06 DIAGNOSIS — M1990 Unspecified osteoarthritis, unspecified site: Secondary | ICD-10-CM | POA: Diagnosis not present

## 2018-07-09 DIAGNOSIS — Q054 Unspecified spina bifida with hydrocephalus: Secondary | ICD-10-CM | POA: Diagnosis not present

## 2018-07-09 DIAGNOSIS — T24211A Burn of second degree of right thigh, initial encounter: Secondary | ICD-10-CM | POA: Diagnosis not present

## 2018-07-10 DIAGNOSIS — G919 Hydrocephalus, unspecified: Secondary | ICD-10-CM | POA: Diagnosis not present

## 2018-07-10 DIAGNOSIS — X19XXXD Contact with other heat and hot substances, subsequent encounter: Secondary | ICD-10-CM | POA: Diagnosis not present

## 2018-07-10 DIAGNOSIS — Q059 Spina bifida, unspecified: Secondary | ICD-10-CM | POA: Diagnosis not present

## 2018-07-10 DIAGNOSIS — T24211D Burn of second degree of right thigh, subsequent encounter: Secondary | ICD-10-CM | POA: Diagnosis not present

## 2018-07-10 DIAGNOSIS — R339 Retention of urine, unspecified: Secondary | ICD-10-CM | POA: Diagnosis not present

## 2018-07-10 DIAGNOSIS — M1990 Unspecified osteoarthritis, unspecified site: Secondary | ICD-10-CM | POA: Diagnosis not present

## 2018-07-13 DIAGNOSIS — T24211D Burn of second degree of right thigh, subsequent encounter: Secondary | ICD-10-CM | POA: Diagnosis not present

## 2018-07-13 DIAGNOSIS — M1990 Unspecified osteoarthritis, unspecified site: Secondary | ICD-10-CM | POA: Diagnosis not present

## 2018-07-13 DIAGNOSIS — R339 Retention of urine, unspecified: Secondary | ICD-10-CM | POA: Diagnosis not present

## 2018-07-13 DIAGNOSIS — X19XXXD Contact with other heat and hot substances, subsequent encounter: Secondary | ICD-10-CM | POA: Diagnosis not present

## 2018-07-13 DIAGNOSIS — Q059 Spina bifida, unspecified: Secondary | ICD-10-CM | POA: Diagnosis not present

## 2018-07-13 DIAGNOSIS — G919 Hydrocephalus, unspecified: Secondary | ICD-10-CM | POA: Diagnosis not present

## 2018-07-16 ENCOUNTER — Encounter (HOSPITAL_BASED_OUTPATIENT_CLINIC_OR_DEPARTMENT_OTHER): Payer: Medicare Other | Attending: Internal Medicine

## 2018-07-16 DIAGNOSIS — Q054 Unspecified spina bifida with hydrocephalus: Secondary | ICD-10-CM | POA: Insufficient documentation

## 2018-07-16 DIAGNOSIS — T24211A Burn of second degree of right thigh, initial encounter: Secondary | ICD-10-CM | POA: Insufficient documentation

## 2018-07-16 DIAGNOSIS — X16XXXA Contact with hot heating appliances, radiators and pipes, initial encounter: Secondary | ICD-10-CM | POA: Insufficient documentation

## 2018-07-17 DIAGNOSIS — Q054 Unspecified spina bifida with hydrocephalus: Secondary | ICD-10-CM | POA: Diagnosis not present

## 2018-07-17 DIAGNOSIS — X16XXXA Contact with hot heating appliances, radiators and pipes, initial encounter: Secondary | ICD-10-CM | POA: Diagnosis not present

## 2018-07-17 DIAGNOSIS — S71101A Unspecified open wound, right thigh, initial encounter: Secondary | ICD-10-CM | POA: Diagnosis not present

## 2018-07-17 DIAGNOSIS — T24211A Burn of second degree of right thigh, initial encounter: Secondary | ICD-10-CM | POA: Diagnosis not present

## 2018-07-18 DIAGNOSIS — R339 Retention of urine, unspecified: Secondary | ICD-10-CM | POA: Diagnosis not present

## 2018-07-18 DIAGNOSIS — M1990 Unspecified osteoarthritis, unspecified site: Secondary | ICD-10-CM | POA: Diagnosis not present

## 2018-07-18 DIAGNOSIS — X19XXXD Contact with other heat and hot substances, subsequent encounter: Secondary | ICD-10-CM | POA: Diagnosis not present

## 2018-07-18 DIAGNOSIS — T24211D Burn of second degree of right thigh, subsequent encounter: Secondary | ICD-10-CM | POA: Diagnosis not present

## 2018-07-18 DIAGNOSIS — Q059 Spina bifida, unspecified: Secondary | ICD-10-CM | POA: Diagnosis not present

## 2018-07-18 DIAGNOSIS — G919 Hydrocephalus, unspecified: Secondary | ICD-10-CM | POA: Diagnosis not present

## 2018-07-24 DIAGNOSIS — Q054 Unspecified spina bifida with hydrocephalus: Secondary | ICD-10-CM | POA: Diagnosis not present

## 2018-07-24 DIAGNOSIS — T24211A Burn of second degree of right thigh, initial encounter: Secondary | ICD-10-CM | POA: Diagnosis not present

## 2018-07-25 DIAGNOSIS — M1990 Unspecified osteoarthritis, unspecified site: Secondary | ICD-10-CM | POA: Diagnosis not present

## 2018-07-25 DIAGNOSIS — X19XXXD Contact with other heat and hot substances, subsequent encounter: Secondary | ICD-10-CM | POA: Diagnosis not present

## 2018-07-25 DIAGNOSIS — Q059 Spina bifida, unspecified: Secondary | ICD-10-CM | POA: Diagnosis not present

## 2018-07-25 DIAGNOSIS — G919 Hydrocephalus, unspecified: Secondary | ICD-10-CM | POA: Diagnosis not present

## 2018-07-25 DIAGNOSIS — R339 Retention of urine, unspecified: Secondary | ICD-10-CM | POA: Diagnosis not present

## 2018-07-25 DIAGNOSIS — T24211D Burn of second degree of right thigh, subsequent encounter: Secondary | ICD-10-CM | POA: Diagnosis not present

## 2018-08-01 DIAGNOSIS — Q059 Spina bifida, unspecified: Secondary | ICD-10-CM | POA: Diagnosis not present

## 2018-08-01 DIAGNOSIS — R339 Retention of urine, unspecified: Secondary | ICD-10-CM | POA: Diagnosis not present

## 2018-08-01 DIAGNOSIS — X19XXXD Contact with other heat and hot substances, subsequent encounter: Secondary | ICD-10-CM | POA: Diagnosis not present

## 2018-08-01 DIAGNOSIS — T24211D Burn of second degree of right thigh, subsequent encounter: Secondary | ICD-10-CM | POA: Diagnosis not present

## 2018-08-01 DIAGNOSIS — M1990 Unspecified osteoarthritis, unspecified site: Secondary | ICD-10-CM | POA: Diagnosis not present

## 2018-08-01 DIAGNOSIS — G919 Hydrocephalus, unspecified: Secondary | ICD-10-CM | POA: Diagnosis not present

## 2018-08-07 DIAGNOSIS — Q054 Unspecified spina bifida with hydrocephalus: Secondary | ICD-10-CM | POA: Diagnosis not present

## 2018-08-07 DIAGNOSIS — T24211A Burn of second degree of right thigh, initial encounter: Secondary | ICD-10-CM | POA: Diagnosis not present

## 2018-08-07 DIAGNOSIS — T24011A Burn of unspecified degree of right thigh, initial encounter: Secondary | ICD-10-CM | POA: Diagnosis not present

## 2018-08-08 DIAGNOSIS — G919 Hydrocephalus, unspecified: Secondary | ICD-10-CM | POA: Diagnosis not present

## 2018-08-08 DIAGNOSIS — R339 Retention of urine, unspecified: Secondary | ICD-10-CM | POA: Diagnosis not present

## 2018-08-08 DIAGNOSIS — T24211D Burn of second degree of right thigh, subsequent encounter: Secondary | ICD-10-CM | POA: Diagnosis not present

## 2018-08-08 DIAGNOSIS — M1990 Unspecified osteoarthritis, unspecified site: Secondary | ICD-10-CM | POA: Diagnosis not present

## 2018-08-08 DIAGNOSIS — X19XXXD Contact with other heat and hot substances, subsequent encounter: Secondary | ICD-10-CM | POA: Diagnosis not present

## 2018-08-08 DIAGNOSIS — Q059 Spina bifida, unspecified: Secondary | ICD-10-CM | POA: Diagnosis not present

## 2018-08-15 ENCOUNTER — Encounter (INDEPENDENT_AMBULATORY_CARE_PROVIDER_SITE_OTHER): Payer: Self-pay | Admitting: Orthopaedic Surgery

## 2018-08-15 ENCOUNTER — Ambulatory Visit (INDEPENDENT_AMBULATORY_CARE_PROVIDER_SITE_OTHER): Payer: Medicare Other | Admitting: Orthopaedic Surgery

## 2018-08-15 DIAGNOSIS — M79651 Pain in right thigh: Secondary | ICD-10-CM | POA: Diagnosis not present

## 2018-08-15 DIAGNOSIS — Q6589 Other specified congenital deformities of hip: Secondary | ICD-10-CM | POA: Diagnosis not present
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
## 2018-08-21 ENCOUNTER — Encounter (HOSPITAL_BASED_OUTPATIENT_CLINIC_OR_DEPARTMENT_OTHER): Payer: Medicare Other | Attending: Internal Medicine

## 2018-08-21 DIAGNOSIS — T24011A Burn of unspecified degree of right thigh, initial encounter: Secondary | ICD-10-CM | POA: Diagnosis not present

## 2018-08-21 DIAGNOSIS — Z09 Encounter for follow-up examination after completed treatment for conditions other than malignant neoplasm: Secondary | ICD-10-CM | POA: Insufficient documentation

## 2018-08-21 DIAGNOSIS — Q054 Unspecified spina bifida with hydrocephalus: Secondary | ICD-10-CM | POA: Insufficient documentation

## 2018-08-29 ENCOUNTER — Telehealth (INDEPENDENT_AMBULATORY_CARE_PROVIDER_SITE_OTHER): Payer: Self-pay | Admitting: *Deleted

## 2018-08-29 ENCOUNTER — Other Ambulatory Visit (INDEPENDENT_AMBULATORY_CARE_PROVIDER_SITE_OTHER): Payer: Self-pay

## 2018-08-29 DIAGNOSIS — M79651 Pain in right thigh: Secondary | ICD-10-CM

## 2018-08-29 DIAGNOSIS — Q6589 Other specified congenital deformities of hip: Secondary | ICD-10-CM

## 2018-08-29 NOTE — Telephone Encounter (Signed)
Ok, I put an order in, I am sorry not sure how that was missed

## 2018-08-29 NOTE — Telephone Encounter (Signed)
Noted, thanks!

## 2018-08-29 NOTE — Telephone Encounter (Signed)
Mother called stating pt was seen 3 weeks and was told pt would be referred to a specialist at [facility] and has not heard anything. I do not see a referral to [facility]. Are you wanting to send pt to [facility]? Please advise.

## 2018-08-30 ENCOUNTER — Telehealth (INDEPENDENT_AMBULATORY_CARE_PROVIDER_SITE_OTHER): Payer: Self-pay | Admitting: Orthopaedic Surgery

## 2018-08-30 NOTE — Telephone Encounter (Signed)
Faxed information to number below today, referral placed yesterday. Mother can call for appointments or office to follow up. I included patient needed to see orthopedic adult reconstruction specialists, may take time to review.  [facility] ([facility] [address]) [address] [place],  [phone]   Trainer  [phone]

## 2018-08-30 NOTE — Telephone Encounter (Signed)
Pts mother called asking about the referral that went in for her daughter to be seen at [facility]. Pt would like to know if there is any number that she can contact them ?

## 2018-08-30 NOTE — Telephone Encounter (Signed)
Can we push this referral faster possibly?

## 2018-09-06 ENCOUNTER — Telehealth (INDEPENDENT_AMBULATORY_CARE_PROVIDER_SITE_OTHER): Payer: Self-pay | Admitting: *Deleted

## 2018-09-06 NOTE — Telephone Encounter (Signed)
Pt mom [name] called 09/05/18 left vm stating she hasnt heard anything about the referral to [facility] and would like to have the number to that facility so she can call and possibly get pt scheduled.   I called today 09/06/18 and sw pt Mary Neal and advised her the referral was sent to [facility] and her records were being reviewed and once they have been reviewed and if that facility accepts referral they will contact her and schedule appt.   Number was given to pt to call if would like to check status of referral.

## 2018-09-13 ENCOUNTER — Telehealth (INDEPENDENT_AMBULATORY_CARE_PROVIDER_SITE_OTHER): Payer: Self-pay | Admitting: *Deleted

## 2018-09-13 NOTE — Telephone Encounter (Signed)
Pt mom [name] called and left vm stating she has not heard from [facility] about daughter referral, states she has called the number that was given and they told her they havent received referral and to call back in week, she stated she called yesterday back to [facility] and they told her still has not received referral but could be in review.   I called [facility] this am and spoke with receptionist there and she states there is no referral in place for this pt but to resend it to a different fax number and she will make sure it gets to the right person. Fax # given was [phone].  I called mom back and told her they claimed they never received referral and that I am working on it now to get it refaxed to them so they can review and contact pt. I did inform pt this may take some time for review and to be patient but if does not hear from anyone in a week to please contact me and I will check status on it again.

## 2018-09-15 ENCOUNTER — Other Ambulatory Visit: Payer: Self-pay | Admitting: Nurse Practitioner

## 2018-09-17 ENCOUNTER — Telehealth (INDEPENDENT_AMBULATORY_CARE_PROVIDER_SITE_OTHER): Payer: Self-pay | Admitting: Orthopaedic Surgery

## 2018-09-17 MED ORDER — HYDROCODONE-ACETAMINOPHEN 5-325 MG PO TABS: 1.0000 | ORAL_TABLET | Freq: Four times a day (QID) | ORAL | 0 refills | Status: DC | PRN

## 2018-09-17 NOTE — Telephone Encounter (Signed)
Pt mother called asking if any pain medication can be sent in for her daughter. She is in extreme pain

## 2018-09-17 NOTE — Telephone Encounter (Signed)
I sent in some hydrocodone as a one-time prescription.  We will not be able to refill this.  She should try to use it sparingly.

## 2018-09-17 NOTE — Telephone Encounter (Signed)
Please advise It's taking awhile to get appt with specialist

## 2018-09-18 NOTE — Telephone Encounter (Signed)
Patient aware.

## 2018-09-25 ENCOUNTER — Telehealth (INDEPENDENT_AMBULATORY_CARE_PROVIDER_SITE_OTHER): Payer: Self-pay | Admitting: Orthopaedic Surgery

## 2018-09-25 DIAGNOSIS — M5410 Radiculopathy, site unspecified: Secondary | ICD-10-CM | POA: Diagnosis not present

## 2018-09-25 DIAGNOSIS — M76891 Other specified enthesopathies of right lower limb, excluding foot: Secondary | ICD-10-CM | POA: Diagnosis not present

## 2018-09-25 DIAGNOSIS — M1631 Unilateral osteoarthritis resulting from hip dysplasia, right hip: Secondary | ICD-10-CM | POA: Diagnosis not present

## 2018-09-25 NOTE — Telephone Encounter (Signed)
Please advise 

## 2018-09-25 NOTE — Telephone Encounter (Signed)
Patient's mother (Harrriett) called asked if Dr Ninfa Linden will prescribe  (Tylenol 3) for her daughter. [name] said her daughter is in a lot of pain. The number to contact [name] is [phone]

## 2018-09-26 ENCOUNTER — Other Ambulatory Visit (INDEPENDENT_AMBULATORY_CARE_PROVIDER_SITE_OTHER): Payer: Self-pay | Admitting: Physician Assistant

## 2018-09-26 MED ORDER — ACETAMINOPHEN-CODEINE #3 300-30 MG PO TABS: 1.0000 | ORAL_TABLET | ORAL | 0 refills | Status: DC | PRN

## 2018-09-26 NOTE — Telephone Encounter (Signed)
Tylenol 3

## 2018-09-26 NOTE — Telephone Encounter (Signed)
Please call and let me know what she needs.

## 2018-09-26 NOTE — Telephone Encounter (Signed)
Sent in

## 2018-10-09 DIAGNOSIS — G8929 Other chronic pain: Secondary | ICD-10-CM | POA: Diagnosis not present

## 2018-10-09 DIAGNOSIS — M25551 Pain in right hip: Secondary | ICD-10-CM | POA: Diagnosis not present

## 2018-10-09 DIAGNOSIS — M79604 Pain in right leg: Secondary | ICD-10-CM | POA: Diagnosis not present

## 2018-10-12 ENCOUNTER — Other Ambulatory Visit (INDEPENDENT_AMBULATORY_CARE_PROVIDER_SITE_OTHER): Payer: Self-pay | Admitting: Physician Assistant

## 2018-10-12 NOTE — Telephone Encounter (Signed)
Please advise on message below. Thank you!

## 2018-10-15 ENCOUNTER — Telehealth: Payer: Self-pay

## 2018-10-15 NOTE — Telephone Encounter (Signed)
Please advise 

## 2018-10-15 NOTE — Telephone Encounter (Signed)
Left the patient a message to call the office back.  I was calling the pt to schedule her an appt for a E-visit and to do a transition of care phone call.

## 2018-10-29 ENCOUNTER — Other Ambulatory Visit (INDEPENDENT_AMBULATORY_CARE_PROVIDER_SITE_OTHER): Payer: Self-pay | Admitting: Physician Assistant

## 2018-10-30 ENCOUNTER — Other Ambulatory Visit (INDEPENDENT_AMBULATORY_CARE_PROVIDER_SITE_OTHER): Payer: Self-pay | Admitting: Physician Assistant

## 2018-10-30 MED ORDER — ACETAMINOPHEN-CODEINE #3 300-30 MG PO TABS: 1.0000 | ORAL_TABLET | ORAL | 0 refills | Status: DC | PRN

## 2018-10-30 NOTE — Telephone Encounter (Signed)
Please advise 

## 2018-11-02 ENCOUNTER — Telehealth: Payer: Self-pay

## 2018-11-02 NOTE — Telephone Encounter (Signed)
I called to do a transition of care call with the pt and to schedule the pt for a virtual appointment for a hospital f/u and the pt's mother Floria Brandau said that the pt wasn't admitted at [facility] that she had an appointment at [facility] to have her right hip looked at and that she is going to see a specialist about her right hip and leg on May 5th

## 2018-11-13 DIAGNOSIS — M1631 Unilateral osteoarthritis resulting from hip dysplasia, right hip: Secondary | ICD-10-CM | POA: Diagnosis not present

## 2018-11-16 ENCOUNTER — Other Ambulatory Visit (INDEPENDENT_AMBULATORY_CARE_PROVIDER_SITE_OTHER): Payer: Self-pay | Admitting: Physician Assistant

## 2018-11-16 NOTE — Telephone Encounter (Signed)
Please advise. OK for refill? 

## 2018-11-28 DIAGNOSIS — D509 Iron deficiency anemia, unspecified: Secondary | ICD-10-CM | POA: Diagnosis not present

## 2018-11-28 DIAGNOSIS — Q057 Lumbar spina bifida without hydrocephalus: Secondary | ICD-10-CM | POA: Diagnosis not present

## 2018-11-28 DIAGNOSIS — Z982 Presence of cerebrospinal fluid drainage device: Secondary | ICD-10-CM | POA: Diagnosis not present

## 2018-11-28 DIAGNOSIS — N319 Neuromuscular dysfunction of bladder, unspecified: Secondary | ICD-10-CM | POA: Diagnosis not present

## 2018-11-28 DIAGNOSIS — Z87440 Personal history of urinary (tract) infections: Secondary | ICD-10-CM | POA: Diagnosis not present

## 2018-11-28 DIAGNOSIS — M1631 Unilateral osteoarthritis resulting from hip dysplasia, right hip: Secondary | ICD-10-CM | POA: Diagnosis not present

## 2018-12-07 ENCOUNTER — Telehealth: Payer: Self-pay | Admitting: Internal Medicine

## 2018-12-07 ENCOUNTER — Telehealth: Payer: Self-pay

## 2018-12-07 DIAGNOSIS — Q055 Cervical spina bifida without hydrocephalus: Secondary | ICD-10-CM | POA: Diagnosis not present

## 2018-12-07 DIAGNOSIS — N319 Neuromuscular dysfunction of bladder, unspecified: Secondary | ICD-10-CM | POA: Diagnosis not present

## 2018-12-07 DIAGNOSIS — G6289 Other specified polyneuropathies: Secondary | ICD-10-CM | POA: Insufficient documentation

## 2018-12-07 DIAGNOSIS — Z01818 Encounter for other preprocedural examination: Secondary | ICD-10-CM | POA: Diagnosis not present

## 2018-12-07 DIAGNOSIS — M1631 Unilateral osteoarthritis resulting from hip dysplasia, right hip: Secondary | ICD-10-CM | POA: Diagnosis not present

## 2018-12-07 NOTE — Telephone Encounter (Signed)
Verbal ok given to start nursing services. appt made for follow up

## 2018-12-07 NOTE — Telephone Encounter (Signed)
Mary Neal W/AMEDYSIS CALLED ANSWERPHONE SERVICE REQ THAT MSG BE SENT TO PROVIDER FOR VERBAL ORDERS TO START SEEING PATIENT THIS WEEKEND. Mary Neal-[phone]

## 2018-12-08 DIAGNOSIS — Q057 Lumbar spina bifida without hydrocephalus: Secondary | ICD-10-CM | POA: Diagnosis not present

## 2018-12-08 DIAGNOSIS — Z87440 Personal history of urinary (tract) infections: Secondary | ICD-10-CM | POA: Diagnosis not present

## 2018-12-08 DIAGNOSIS — N319 Neuromuscular dysfunction of bladder, unspecified: Secondary | ICD-10-CM | POA: Diagnosis not present

## 2018-12-08 DIAGNOSIS — M1631 Unilateral osteoarthritis resulting from hip dysplasia, right hip: Secondary | ICD-10-CM | POA: Diagnosis not present

## 2018-12-08 DIAGNOSIS — Z982 Presence of cerebrospinal fluid drainage device: Secondary | ICD-10-CM | POA: Diagnosis not present

## 2018-12-08 DIAGNOSIS — D509 Iron deficiency anemia, unspecified: Secondary | ICD-10-CM | POA: Diagnosis not present

## 2018-12-10 DIAGNOSIS — Z982 Presence of cerebrospinal fluid drainage device: Secondary | ICD-10-CM | POA: Diagnosis not present

## 2018-12-10 DIAGNOSIS — N319 Neuromuscular dysfunction of bladder, unspecified: Secondary | ICD-10-CM | POA: Diagnosis not present

## 2018-12-10 DIAGNOSIS — D509 Iron deficiency anemia, unspecified: Secondary | ICD-10-CM | POA: Diagnosis not present

## 2018-12-10 DIAGNOSIS — Z87440 Personal history of urinary (tract) infections: Secondary | ICD-10-CM | POA: Diagnosis not present

## 2018-12-10 DIAGNOSIS — Q057 Lumbar spina bifida without hydrocephalus: Secondary | ICD-10-CM | POA: Diagnosis not present

## 2018-12-10 DIAGNOSIS — M1631 Unilateral osteoarthritis resulting from hip dysplasia, right hip: Secondary | ICD-10-CM | POA: Diagnosis not present

## 2018-12-10 HISTORY — PX: TOTAL HIP ARTHROPLASTY: SHX124

## 2018-12-11 DIAGNOSIS — Z1159 Encounter for screening for other viral diseases: Secondary | ICD-10-CM | POA: Diagnosis not present

## 2018-12-11 DIAGNOSIS — Z01818 Encounter for other preprocedural examination: Secondary | ICD-10-CM | POA: Diagnosis not present

## 2018-12-12 DIAGNOSIS — Z982 Presence of cerebrospinal fluid drainage device: Secondary | ICD-10-CM | POA: Diagnosis not present

## 2018-12-12 DIAGNOSIS — M1631 Unilateral osteoarthritis resulting from hip dysplasia, right hip: Secondary | ICD-10-CM | POA: Diagnosis not present

## 2018-12-12 DIAGNOSIS — D509 Iron deficiency anemia, unspecified: Secondary | ICD-10-CM | POA: Diagnosis not present

## 2018-12-12 DIAGNOSIS — Q057 Lumbar spina bifida without hydrocephalus: Secondary | ICD-10-CM | POA: Diagnosis not present

## 2018-12-12 DIAGNOSIS — N319 Neuromuscular dysfunction of bladder, unspecified: Secondary | ICD-10-CM | POA: Diagnosis not present

## 2018-12-12 DIAGNOSIS — Z87440 Personal history of urinary (tract) infections: Secondary | ICD-10-CM | POA: Diagnosis not present

## 2018-12-13 DIAGNOSIS — R42 Dizziness and giddiness: Secondary | ICD-10-CM | POA: Diagnosis not present

## 2018-12-13 DIAGNOSIS — N319 Neuromuscular dysfunction of bladder, unspecified: Secondary | ICD-10-CM | POA: Diagnosis not present

## 2018-12-13 DIAGNOSIS — K592 Neurogenic bowel, not elsewhere classified: Secondary | ICD-10-CM | POA: Insufficient documentation

## 2018-12-13 DIAGNOSIS — Z8679 Personal history of other diseases of the circulatory system: Secondary | ICD-10-CM | POA: Diagnosis not present

## 2018-12-13 DIAGNOSIS — Q055 Cervical spina bifida without hydrocephalus: Secondary | ICD-10-CM | POA: Diagnosis not present

## 2018-12-13 DIAGNOSIS — Z96641 Presence of right artificial hip joint: Secondary | ICD-10-CM | POA: Diagnosis not present

## 2018-12-13 DIAGNOSIS — S32401A Unspecified fracture of right acetabulum, initial encounter for closed fracture: Secondary | ICD-10-CM | POA: Diagnosis not present

## 2018-12-13 DIAGNOSIS — Z471 Aftercare following joint replacement surgery: Secondary | ICD-10-CM | POA: Diagnosis not present

## 2018-12-13 DIAGNOSIS — M1611 Unilateral primary osteoarthritis, right hip: Secondary | ICD-10-CM | POA: Diagnosis not present

## 2018-12-14 DIAGNOSIS — K592 Neurogenic bowel, not elsewhere classified: Secondary | ICD-10-CM | POA: Diagnosis not present

## 2018-12-14 DIAGNOSIS — N319 Neuromuscular dysfunction of bladder, unspecified: Secondary | ICD-10-CM | POA: Diagnosis not present

## 2018-12-14 DIAGNOSIS — Z91040 Latex allergy status: Secondary | ICD-10-CM | POA: Diagnosis not present

## 2018-12-14 DIAGNOSIS — R197 Diarrhea, unspecified: Secondary | ICD-10-CM | POA: Diagnosis not present

## 2018-12-14 DIAGNOSIS — Z8679 Personal history of other diseases of the circulatory system: Secondary | ICD-10-CM | POA: Diagnosis not present

## 2018-12-14 DIAGNOSIS — D649 Anemia, unspecified: Secondary | ICD-10-CM | POA: Diagnosis not present

## 2018-12-14 DIAGNOSIS — Q055 Cervical spina bifida without hydrocephalus: Secondary | ICD-10-CM | POA: Diagnosis not present

## 2018-12-14 DIAGNOSIS — Z982 Presence of cerebrospinal fluid drainage device: Secondary | ICD-10-CM | POA: Diagnosis not present

## 2018-12-14 DIAGNOSIS — Q057 Lumbar spina bifida without hydrocephalus: Secondary | ICD-10-CM | POA: Diagnosis not present

## 2018-12-14 DIAGNOSIS — Z880 Allergy status to penicillin: Secondary | ICD-10-CM | POA: Diagnosis not present

## 2018-12-14 DIAGNOSIS — M1611 Unilateral primary osteoarthritis, right hip: Secondary | ICD-10-CM | POA: Diagnosis present

## 2018-12-14 DIAGNOSIS — K5900 Constipation, unspecified: Secondary | ICD-10-CM | POA: Diagnosis present

## 2018-12-14 DIAGNOSIS — K219 Gastro-esophageal reflux disease without esophagitis: Secondary | ICD-10-CM | POA: Diagnosis not present

## 2018-12-14 DIAGNOSIS — R42 Dizziness and giddiness: Secondary | ICD-10-CM | POA: Diagnosis not present

## 2018-12-18 DIAGNOSIS — K219 Gastro-esophageal reflux disease without esophagitis: Secondary | ICD-10-CM | POA: Diagnosis not present

## 2018-12-18 DIAGNOSIS — Z96652 Presence of left artificial knee joint: Secondary | ICD-10-CM | POA: Diagnosis present

## 2018-12-18 DIAGNOSIS — R531 Weakness: Secondary | ICD-10-CM | POA: Diagnosis present

## 2018-12-18 DIAGNOSIS — R5383 Other fatigue: Secondary | ICD-10-CM | POA: Diagnosis present

## 2018-12-18 DIAGNOSIS — K592 Neurogenic bowel, not elsewhere classified: Secondary | ICD-10-CM | POA: Diagnosis present

## 2018-12-18 DIAGNOSIS — Z96641 Presence of right artificial hip joint: Secondary | ICD-10-CM | POA: Diagnosis not present

## 2018-12-18 DIAGNOSIS — G8929 Other chronic pain: Secondary | ICD-10-CM | POA: Diagnosis present

## 2018-12-18 DIAGNOSIS — R4189 Other symptoms and signs involving cognitive functions and awareness: Secondary | ICD-10-CM | POA: Diagnosis present

## 2018-12-18 DIAGNOSIS — Z982 Presence of cerebrospinal fluid drainage device: Secondary | ICD-10-CM | POA: Diagnosis not present

## 2018-12-18 DIAGNOSIS — Z471 Aftercare following joint replacement surgery: Secondary | ICD-10-CM | POA: Diagnosis not present

## 2018-12-18 DIAGNOSIS — R42 Dizziness and giddiness: Secondary | ICD-10-CM | POA: Diagnosis not present

## 2018-12-18 DIAGNOSIS — Q057 Lumbar spina bifida without hydrocephalus: Secondary | ICD-10-CM | POA: Diagnosis not present

## 2018-12-18 DIAGNOSIS — N319 Neuromuscular dysfunction of bladder, unspecified: Secondary | ICD-10-CM | POA: Diagnosis present

## 2018-12-18 DIAGNOSIS — M25451 Effusion, right hip: Secondary | ICD-10-CM | POA: Diagnosis present

## 2018-12-18 MED ORDER — BISACODYL 10 MG RE SUPP
10.00 | RECTAL | Status: DC
Start: ? — End: 2018-12-18

## 2018-12-18 MED ORDER — GENERIC EXTERNAL MEDICATION
Status: DC
Start: ? — End: 2018-12-18

## 2018-12-18 MED ORDER — OXYBUTYNIN CHLORIDE ER 5 MG PO TB24
15.00 | ORAL_TABLET | ORAL | Status: DC
Start: 2018-12-19 — End: 2018-12-18

## 2018-12-18 MED ORDER — ACETAMINOPHEN 325 MG PO TABS
650.00 | ORAL_TABLET | ORAL | Status: DC
Start: ? — End: 2018-12-18

## 2018-12-18 MED ORDER — OXYCODONE HCL 5 MG PO TABS
2.50 | ORAL_TABLET | ORAL | Status: DC
Start: ? — End: 2018-12-18

## 2018-12-18 MED ORDER — ASPIRIN EC 325 MG PO TBEC
325.00 | DELAYED_RELEASE_TABLET | ORAL | Status: DC
Start: 2018-12-18 — End: 2018-12-18

## 2018-12-18 MED ORDER — HYDROMORPHONE HCL 1 MG/ML IJ SOLN
0.25 | INTRAMUSCULAR | Status: DC
Start: ? — End: 2018-12-18

## 2018-12-18 MED ORDER — SENNOSIDES-DOCUSATE SODIUM 8.6-50 MG PO TABS
2.00 | ORAL_TABLET | ORAL | Status: DC
Start: 2018-12-18 — End: 2018-12-18

## 2018-12-25 ENCOUNTER — Ambulatory Visit: Payer: Medicare Other | Admitting: Internal Medicine

## 2018-12-26 ENCOUNTER — Telehealth: Payer: Self-pay

## 2018-12-26 NOTE — Telephone Encounter (Signed)
Returned call to patients mother. Mother is concerned about home health for patient. Pt is at [facility] for rehab and isnt expected to be discharged until next week. Encouraged patients mother to call back when she is discharged and we will make a follow up appt and make a referral for home health if the hospital social worker has not. Encouraged family to call back with any other concerns.

## 2019-01-02 MED ORDER — OXYCODONE HCL 5 MG PO TABS
2.50 | ORAL_TABLET | ORAL | Status: DC
Start: ? — End: 2019-01-02

## 2019-01-02 MED ORDER — OXYBUTYNIN CHLORIDE ER 5 MG PO TB24
15.00 | ORAL_TABLET | ORAL | Status: DC
Start: 2019-01-03 — End: 2019-01-02

## 2019-01-02 MED ORDER — BISACODYL 10 MG RE SUPP
10.00 | RECTAL | Status: DC
Start: ? — End: 2019-01-02

## 2019-01-02 MED ORDER — ASPIRIN EC 325 MG PO TBEC
325.00 | DELAYED_RELEASE_TABLET | ORAL | Status: DC
Start: 2019-01-02 — End: 2019-01-02

## 2019-01-02 MED ORDER — SENNOSIDES-DOCUSATE SODIUM 8.6-50 MG PO TABS
2.00 | ORAL_TABLET | ORAL | Status: DC
Start: ? — End: 2019-01-02

## 2019-01-02 MED ORDER — ACETAMINOPHEN 325 MG PO TABS
650.00 | ORAL_TABLET | ORAL | Status: DC
Start: ? — End: 2019-01-02

## 2019-01-03 ENCOUNTER — Telehealth: Payer: Self-pay

## 2019-01-03 DIAGNOSIS — Q057 Lumbar spina bifida without hydrocephalus: Secondary | ICD-10-CM | POA: Diagnosis not present

## 2019-01-03 DIAGNOSIS — Z87440 Personal history of urinary (tract) infections: Secondary | ICD-10-CM | POA: Diagnosis not present

## 2019-01-03 DIAGNOSIS — M1631 Unilateral osteoarthritis resulting from hip dysplasia, right hip: Secondary | ICD-10-CM | POA: Diagnosis not present

## 2019-01-03 DIAGNOSIS — N319 Neuromuscular dysfunction of bladder, unspecified: Secondary | ICD-10-CM | POA: Diagnosis not present

## 2019-01-03 DIAGNOSIS — D509 Iron deficiency anemia, unspecified: Secondary | ICD-10-CM | POA: Diagnosis not present

## 2019-01-03 DIAGNOSIS — Z982 Presence of cerebrospinal fluid drainage device: Secondary | ICD-10-CM | POA: Diagnosis not present

## 2019-01-03 NOTE — Telephone Encounter (Signed)
Transition Care Management Follow-up Telephone Call  Date of discharge and from where: 01/02/2019 from [facility]  How have you been since you were released from the hospital? She has been doing pretty good per mother  Any questions or concerns? No   Items Reviewed:  Did the pt receive and understand the discharge instructions provided? Yes   Medications obtained and verified? Yes   Any new allergies since your discharge? No   Dietary orders reviewed? Yes  Do you have support at home? Yes   Other (ie: DME, Home Health, etc) Home health   Functional Questionnaire: (I = Independent and D = Dependent) ADL's: D  Bathing/Dressing- D   Meal Prep- D  Eating- I  Maintaining continence- D  Transferring/Ambulation- D  Managing Meds- I   Follow up appointments reviewed:    PCP Hospital f/u appt confirmed? Yes  Scheduled to see Dr. Baird Cancer on 01/08/2019 @ 3:30.  [facility] f/u appt confirmed? No  Scheduled to see  on  @ .  Are transportation arrangements needed? No   If their condition worsens, is the pt aware to call  their PCP or go to the ED? Yes  Was the patient provided with contact information for the PCP's office or ED? Yes  Was the pt encouraged to call back with questions or concerns? Yes

## 2019-01-07 DIAGNOSIS — M1631 Unilateral osteoarthritis resulting from hip dysplasia, right hip: Secondary | ICD-10-CM | POA: Diagnosis not present

## 2019-01-07 DIAGNOSIS — R269 Unspecified abnormalities of gait and mobility: Secondary | ICD-10-CM | POA: Diagnosis not present

## 2019-01-07 DIAGNOSIS — Z7982 Long term (current) use of aspirin: Secondary | ICD-10-CM | POA: Diagnosis not present

## 2019-01-07 DIAGNOSIS — R32 Unspecified urinary incontinence: Secondary | ICD-10-CM | POA: Diagnosis not present

## 2019-01-07 DIAGNOSIS — D509 Iron deficiency anemia, unspecified: Secondary | ICD-10-CM | POA: Diagnosis not present

## 2019-01-07 DIAGNOSIS — Z87440 Personal history of urinary (tract) infections: Secondary | ICD-10-CM | POA: Diagnosis not present

## 2019-01-07 DIAGNOSIS — Q057 Lumbar spina bifida without hydrocephalus: Secondary | ICD-10-CM | POA: Diagnosis not present

## 2019-01-07 DIAGNOSIS — K5900 Constipation, unspecified: Secondary | ICD-10-CM | POA: Diagnosis not present

## 2019-01-07 DIAGNOSIS — N319 Neuromuscular dysfunction of bladder, unspecified: Secondary | ICD-10-CM | POA: Diagnosis not present

## 2019-01-07 DIAGNOSIS — Q059 Spina bifida, unspecified: Secondary | ICD-10-CM | POA: Diagnosis not present

## 2019-01-07 DIAGNOSIS — Z982 Presence of cerebrospinal fluid drainage device: Secondary | ICD-10-CM | POA: Diagnosis not present

## 2019-01-08 ENCOUNTER — Ambulatory Visit (INDEPENDENT_AMBULATORY_CARE_PROVIDER_SITE_OTHER): Payer: Medicare Other | Admitting: Internal Medicine

## 2019-01-08 ENCOUNTER — Encounter: Payer: Self-pay | Admitting: Internal Medicine

## 2019-01-08 ENCOUNTER — Other Ambulatory Visit: Payer: Self-pay

## 2019-01-08 VITALS — Ht <= 58 in

## 2019-01-08 DIAGNOSIS — K5901 Slow transit constipation: Secondary | ICD-10-CM | POA: Diagnosis not present

## 2019-01-08 DIAGNOSIS — Z09 Encounter for follow-up examination after completed treatment for conditions other than malignant neoplasm: Secondary | ICD-10-CM

## 2019-01-08 DIAGNOSIS — G8220 Paraplegia, unspecified: Secondary | ICD-10-CM | POA: Diagnosis not present

## 2019-01-08 DIAGNOSIS — M25551 Pain in right hip: Secondary | ICD-10-CM | POA: Diagnosis not present

## 2019-01-08 DIAGNOSIS — M1631 Unilateral osteoarthritis resulting from hip dysplasia, right hip: Secondary | ICD-10-CM | POA: Diagnosis not present

## 2019-01-08 DIAGNOSIS — Q059 Spina bifida, unspecified: Secondary | ICD-10-CM

## 2019-01-08 DIAGNOSIS — D509 Iron deficiency anemia, unspecified: Secondary | ICD-10-CM | POA: Diagnosis not present

## 2019-01-08 DIAGNOSIS — Q057 Lumbar spina bifida without hydrocephalus: Secondary | ICD-10-CM | POA: Diagnosis not present

## 2019-01-08 DIAGNOSIS — K219 Gastro-esophageal reflux disease without esophagitis: Secondary | ICD-10-CM

## 2019-01-08 DIAGNOSIS — Z87440 Personal history of urinary (tract) infections: Secondary | ICD-10-CM | POA: Diagnosis not present

## 2019-01-08 DIAGNOSIS — N319 Neuromuscular dysfunction of bladder, unspecified: Secondary | ICD-10-CM | POA: Diagnosis not present

## 2019-01-08 DIAGNOSIS — Z982 Presence of cerebrospinal fluid drainage device: Secondary | ICD-10-CM | POA: Diagnosis not present

## 2019-01-08 MED ORDER — OMEPRAZOLE 40 MG PO CPDR: 40.0000 mg | DELAYED_RELEASE_CAPSULE | Freq: Every day | ORAL | 1 refills | Status: DC

## 2019-01-08 NOTE — Patient Instructions (Signed)

## 2019-01-09 DIAGNOSIS — M1631 Unilateral osteoarthritis resulting from hip dysplasia, right hip: Secondary | ICD-10-CM | POA: Diagnosis not present

## 2019-01-09 DIAGNOSIS — Q057 Lumbar spina bifida without hydrocephalus: Secondary | ICD-10-CM | POA: Diagnosis not present

## 2019-01-09 DIAGNOSIS — Z982 Presence of cerebrospinal fluid drainage device: Secondary | ICD-10-CM | POA: Diagnosis not present

## 2019-01-09 DIAGNOSIS — D509 Iron deficiency anemia, unspecified: Secondary | ICD-10-CM | POA: Diagnosis not present

## 2019-01-09 DIAGNOSIS — N319 Neuromuscular dysfunction of bladder, unspecified: Secondary | ICD-10-CM | POA: Diagnosis not present

## 2019-01-09 DIAGNOSIS — Z87440 Personal history of urinary (tract) infections: Secondary | ICD-10-CM | POA: Diagnosis not present

## 2019-01-10 DIAGNOSIS — D509 Iron deficiency anemia, unspecified: Secondary | ICD-10-CM | POA: Diagnosis not present

## 2019-01-10 DIAGNOSIS — Z87440 Personal history of urinary (tract) infections: Secondary | ICD-10-CM | POA: Diagnosis not present

## 2019-01-10 DIAGNOSIS — Q057 Lumbar spina bifida without hydrocephalus: Secondary | ICD-10-CM | POA: Diagnosis not present

## 2019-01-10 DIAGNOSIS — Z982 Presence of cerebrospinal fluid drainage device: Secondary | ICD-10-CM | POA: Diagnosis not present

## 2019-01-10 DIAGNOSIS — N319 Neuromuscular dysfunction of bladder, unspecified: Secondary | ICD-10-CM | POA: Diagnosis not present

## 2019-01-10 DIAGNOSIS — M1631 Unilateral osteoarthritis resulting from hip dysplasia, right hip: Secondary | ICD-10-CM | POA: Diagnosis not present

## 2019-01-14 DIAGNOSIS — Z87440 Personal history of urinary (tract) infections: Secondary | ICD-10-CM | POA: Diagnosis not present

## 2019-01-14 DIAGNOSIS — M1631 Unilateral osteoarthritis resulting from hip dysplasia, right hip: Secondary | ICD-10-CM | POA: Diagnosis not present

## 2019-01-14 DIAGNOSIS — Q057 Lumbar spina bifida without hydrocephalus: Secondary | ICD-10-CM | POA: Diagnosis not present

## 2019-01-14 DIAGNOSIS — N319 Neuromuscular dysfunction of bladder, unspecified: Secondary | ICD-10-CM | POA: Diagnosis not present

## 2019-01-14 DIAGNOSIS — D509 Iron deficiency anemia, unspecified: Secondary | ICD-10-CM | POA: Diagnosis not present

## 2019-01-14 DIAGNOSIS — Z982 Presence of cerebrospinal fluid drainage device: Secondary | ICD-10-CM | POA: Diagnosis not present

## 2019-01-16 DIAGNOSIS — Z87440 Personal history of urinary (tract) infections: Secondary | ICD-10-CM | POA: Diagnosis not present

## 2019-01-16 DIAGNOSIS — Z982 Presence of cerebrospinal fluid drainage device: Secondary | ICD-10-CM | POA: Diagnosis not present

## 2019-01-16 DIAGNOSIS — Q057 Lumbar spina bifida without hydrocephalus: Secondary | ICD-10-CM | POA: Diagnosis not present

## 2019-01-16 DIAGNOSIS — D509 Iron deficiency anemia, unspecified: Secondary | ICD-10-CM | POA: Diagnosis not present

## 2019-01-16 DIAGNOSIS — N319 Neuromuscular dysfunction of bladder, unspecified: Secondary | ICD-10-CM | POA: Diagnosis not present

## 2019-01-16 DIAGNOSIS — M1631 Unilateral osteoarthritis resulting from hip dysplasia, right hip: Secondary | ICD-10-CM | POA: Diagnosis not present

## 2019-01-17 DIAGNOSIS — Q057 Lumbar spina bifida without hydrocephalus: Secondary | ICD-10-CM | POA: Diagnosis not present

## 2019-01-17 DIAGNOSIS — M1631 Unilateral osteoarthritis resulting from hip dysplasia, right hip: Secondary | ICD-10-CM | POA: Diagnosis not present

## 2019-01-17 DIAGNOSIS — Z982 Presence of cerebrospinal fluid drainage device: Secondary | ICD-10-CM | POA: Diagnosis not present

## 2019-01-17 DIAGNOSIS — N319 Neuromuscular dysfunction of bladder, unspecified: Secondary | ICD-10-CM | POA: Diagnosis not present

## 2019-01-17 DIAGNOSIS — Z87440 Personal history of urinary (tract) infections: Secondary | ICD-10-CM | POA: Diagnosis not present

## 2019-01-17 DIAGNOSIS — D509 Iron deficiency anemia, unspecified: Secondary | ICD-10-CM | POA: Diagnosis not present

## 2019-01-21 DIAGNOSIS — N319 Neuromuscular dysfunction of bladder, unspecified: Secondary | ICD-10-CM | POA: Diagnosis not present

## 2019-01-21 DIAGNOSIS — Q057 Lumbar spina bifida without hydrocephalus: Secondary | ICD-10-CM | POA: Diagnosis not present

## 2019-01-21 DIAGNOSIS — D509 Iron deficiency anemia, unspecified: Secondary | ICD-10-CM | POA: Diagnosis not present

## 2019-01-21 DIAGNOSIS — M1631 Unilateral osteoarthritis resulting from hip dysplasia, right hip: Secondary | ICD-10-CM | POA: Diagnosis not present

## 2019-01-21 DIAGNOSIS — Z87440 Personal history of urinary (tract) infections: Secondary | ICD-10-CM | POA: Diagnosis not present

## 2019-01-21 DIAGNOSIS — Z982 Presence of cerebrospinal fluid drainage device: Secondary | ICD-10-CM | POA: Diagnosis not present

## 2019-01-23 DIAGNOSIS — Z87440 Personal history of urinary (tract) infections: Secondary | ICD-10-CM | POA: Diagnosis not present

## 2019-01-23 DIAGNOSIS — N319 Neuromuscular dysfunction of bladder, unspecified: Secondary | ICD-10-CM | POA: Diagnosis not present

## 2019-01-23 DIAGNOSIS — Q057 Lumbar spina bifida without hydrocephalus: Secondary | ICD-10-CM | POA: Diagnosis not present

## 2019-01-23 DIAGNOSIS — Z982 Presence of cerebrospinal fluid drainage device: Secondary | ICD-10-CM | POA: Diagnosis not present

## 2019-01-23 DIAGNOSIS — D509 Iron deficiency anemia, unspecified: Secondary | ICD-10-CM | POA: Diagnosis not present

## 2019-01-23 DIAGNOSIS — M1631 Unilateral osteoarthritis resulting from hip dysplasia, right hip: Secondary | ICD-10-CM | POA: Diagnosis not present

## 2019-01-25 DIAGNOSIS — Z982 Presence of cerebrospinal fluid drainage device: Secondary | ICD-10-CM | POA: Diagnosis not present

## 2019-01-25 DIAGNOSIS — Q057 Lumbar spina bifida without hydrocephalus: Secondary | ICD-10-CM | POA: Diagnosis not present

## 2019-01-25 DIAGNOSIS — D509 Iron deficiency anemia, unspecified: Secondary | ICD-10-CM | POA: Diagnosis not present

## 2019-01-25 DIAGNOSIS — N319 Neuromuscular dysfunction of bladder, unspecified: Secondary | ICD-10-CM | POA: Diagnosis not present

## 2019-01-25 DIAGNOSIS — M1631 Unilateral osteoarthritis resulting from hip dysplasia, right hip: Secondary | ICD-10-CM | POA: Diagnosis not present

## 2019-01-25 DIAGNOSIS — Z87440 Personal history of urinary (tract) infections: Secondary | ICD-10-CM | POA: Diagnosis not present

## 2019-01-27 NOTE — Progress Notes (Addendum)
Virtual Visit via Phone   This visit type was conducted due to national recommendations for restrictions regarding the COVID-19 Pandemic (e.g. social distancing) in an effort to limit this patient's exposure and mitigate transmission in our community.  Due to her co-morbid illnesses, this patient is at least at moderate risk for complications without adequate follow up.  This format is felt to be most appropriate for this patient at this time.  All issues noted in this document were discussed and addressed.  A limited physical exam was performed with this format.    This visit type was conducted due to national recommendations for restrictions regarding the COVID-19 Pandemic (e.g. social distancing) in an effort to limit this patient's exposure and mitigate transmission in our community.  Patients identity confirmed using two different identifiers.  This format is felt to be most appropriate for this patient at this time.  All issues noted in this document were discussed and addressed.  No physical exam was performed (except for noted visual exam findings with Video Visits).    Date:  01/27/2019   ID:  Mary Neal, DOB [birth year], MRN [id number]  Patient Location:  Home, accompanied by her Mother  Provider location:   Office    Chief Complaint:  Hospital f/u  History of Present Illness:    Mary Neal is a 53 y.o. female who presents via video conferencing for a telehealth visit today.    The patient does not have symptoms concerning for COVID-19 infection (fever, chills, cough, or new shortness of breath).   She presents today for virtual visit. She prefers this method of contact due to COVID-19 pandemic.  She presents today for hospital f/u.  She was admitted to [facility] on June 4th for a right total hip replacement. She did not suffer any post-operative complications - other than bout of dizziness. This was thought to be due to orthostatics, advised to increase  hydration. She has had no further episodes since discharge. She was discharged from hospital and transferred to [facility] on 12/18/2018. She reports participating in physical therapy fully while in rehab. She is ambulatory with KAFOs and [place] crutches. She was discharged from inpatient rehab on 6/24 in stable condition. She has done well since discharge. She has no new complaints.       Past Medical History:  Diagnosis Date  . Abnormal Pap smear 2013  . Dislocation of right hip ([place]) 06/09/2018   Per MRI  . Spina bifida ([place])   . Uterine prolapse   . Wears glasses    History reviewed. No pertinent surgical history.   Current Meds  Medication Sig  . [EXPIRED] aspirin EC 325 MG tablet Take by mouth.  . Cholecalciferol 1000 UNITS capsule Take 500 Units by mouth daily.  Marland Kitchen docusate sodium (COLACE) 100 MG capsule Take 1 capsule (100 mg total) by mouth every 12 (twelve) hours. (Patient taking differently: Take 100 mg by mouth 2 (two) times daily as needed. Takes as needed)  . Multiple Vitamin (MULTI-VITAMINS) TABS Take by mouth.  . oxybutynin (DITROPAN XL) 15 MG 24 hr tablet Take 15 mg by mouth 2 (two) times daily.   . polyethylene glycol (MIRALAX) packet Take 17 g by mouth daily as needed for moderate constipation or severe constipation.  . vitamin C (ASCORBIC ACID) 500 MG tablet Take by mouth.  . [DISCONTINUED] omeprazole (PRILOSEC) 20 MG capsule TAKE ONE CAPSULE BY MOUTH EVERY DAY BEFORE A MEAL     Allergies:   Avocado,  Banana, Latex, and Penicillins   Social History   Tobacco Use  . Smoking status: Never Smoker  . Smokeless tobacco: Never Used  Substance Use Topics  . Alcohol use: No    Alcohol/week: 0.0 standard drinks  . Drug use: No     Family Hx: The patient's family history includes Hypertension in her mother.  ROS:   Please see the history of present illness.    Review of Systems  Constitutional: Negative.   Respiratory: Negative.   Cardiovascular: Negative.    Gastrointestinal: Negative.   Neurological: Negative.   Psychiatric/Behavioral: Negative.     All other systems reviewed and are negative.   Labs/Other Tests and Data Reviewed:    Recent Labs: 06/19/2018: ALT 18; BUN 18; Creatinine, Ser 0.68; Hemoglobin 12.3; Platelets 169; Potassium 3.9; Sodium 139; TSH 0.885   Recent Lipid Panel Lab Results  Component Value Date/Time   CHOL 189 06/19/2018 11:18 AM   TRIG 38 06/19/2018 11:18 AM   HDL 92 06/19/2018 11:18 AM   CHOLHDL 2.1 06/19/2018 11:18 AM   LDLCALC 89 06/19/2018 11:18 AM    Wt Readings from Last 3 Encounters:  06/19/18 104 lb (47.2 kg)  04/12/15 92 lb (41.7 kg)  12/16/13 96 lb (43.5 kg)     Exam:    Vital Signs:  Ht 4\' 9"  (1.448 m)   BMI 22.51 kg/m     Physical Exam NOT PERFORMED, - THIS IS A PHONE VISIT. ASSESSMENT & PLAN:     1. Right hip pain  TCM PERFORMED. A MEMBER OF THE CLINICAL TEAM SPOKE WITH THE PATIENT UPON DISCHARGE. DISCHARGE SUMMARY WAS REVIEWED IN FULL DETAIL DURING THE VISIT. MEDS RECONCILED AND COMPARED TO DISCHARGE MEDS. MEDICATION LIST WAS UPDATED AND REVIEWED WITH THE PATIENT. GREATER THAN 50% FACE TO FACE TIME WAS SPENT IN COUNSELING AND COORDINATION OF CARE. SHE IS ENCOURAGED TO F/U WITH ORTHO AS SCHEDULED. SHE IS ALSO ENCOURAGED TO CONTINUE WITH HOME PT/OT AS PER REHAB D/C INSTRUCTIONS. SHE PLANS TO PARTICIPATE FULLY. ALL QUESTIONS WERE ANSWERED TO THE SATISFACTION OF THE PATIENT.    2. Spina bifida without hydrocephalus, unspecified spinal region [facility])  Chronic ,yet stable. SHE HAS RESULTANT  3. Slow transit constipation  Chronic. She will continue to use Miralax prn. If her sx persist, she may benefit from Linzess 72mg  daily.   4. Gastroesophageal reflux disease without esophagitis  Chronic. Encouraged to stop eating 3 hours prior to going to bed. She will continue with current meds.   5. Paraparesis ([place])  Chronic.     COVID-19 Education: The signs and symptoms of COVID-19  were discussed with the patient and how to seek care for testing (follow up with PCP or arrange E-visit).  The importance of social distancing was discussed today.  Patient Risk:   After full review of this patients clinical status, I feel that they are at least moderate risk at this time.  Time:   Today, I have spent 19 minutes with the patient with telehealth technology discussing above diagnoses.     Medication Adjustments/Labs and Tests Ordered: Current medicines are reviewed at length with the patient today.  Concerns regarding medicines are outlined above.   Tests Ordered: No orders of the defined types were placed in this encounter.   Medication Changes: Meds ordered this encounter  Medications  . omeprazole (PRILOSEC) 40 MG capsule    Sig: Take 1 capsule (40 mg total) by mouth daily.    Dispense:  30 capsule    Refill:  1    Disposition:  Follow up: prn, as scheduled.  Signed, Maximino Greenland, MD

## 2019-01-28 DIAGNOSIS — Q057 Lumbar spina bifida without hydrocephalus: Secondary | ICD-10-CM | POA: Diagnosis not present

## 2019-01-28 DIAGNOSIS — N319 Neuromuscular dysfunction of bladder, unspecified: Secondary | ICD-10-CM | POA: Diagnosis not present

## 2019-01-28 DIAGNOSIS — Z87440 Personal history of urinary (tract) infections: Secondary | ICD-10-CM | POA: Diagnosis not present

## 2019-01-28 DIAGNOSIS — D509 Iron deficiency anemia, unspecified: Secondary | ICD-10-CM | POA: Diagnosis not present

## 2019-01-28 DIAGNOSIS — Z982 Presence of cerebrospinal fluid drainage device: Secondary | ICD-10-CM | POA: Diagnosis not present

## 2019-01-28 DIAGNOSIS — M1631 Unilateral osteoarthritis resulting from hip dysplasia, right hip: Secondary | ICD-10-CM | POA: Diagnosis not present

## 2019-01-30 DIAGNOSIS — Z982 Presence of cerebrospinal fluid drainage device: Secondary | ICD-10-CM | POA: Diagnosis not present

## 2019-01-30 DIAGNOSIS — M1631 Unilateral osteoarthritis resulting from hip dysplasia, right hip: Secondary | ICD-10-CM | POA: Diagnosis not present

## 2019-01-30 DIAGNOSIS — N319 Neuromuscular dysfunction of bladder, unspecified: Secondary | ICD-10-CM | POA: Diagnosis not present

## 2019-01-30 DIAGNOSIS — D509 Iron deficiency anemia, unspecified: Secondary | ICD-10-CM | POA: Diagnosis not present

## 2019-01-30 DIAGNOSIS — Q057 Lumbar spina bifida without hydrocephalus: Secondary | ICD-10-CM | POA: Diagnosis not present

## 2019-01-30 DIAGNOSIS — Z87440 Personal history of urinary (tract) infections: Secondary | ICD-10-CM | POA: Diagnosis not present

## 2019-01-31 DIAGNOSIS — N319 Neuromuscular dysfunction of bladder, unspecified: Secondary | ICD-10-CM | POA: Diagnosis not present

## 2019-01-31 DIAGNOSIS — D509 Iron deficiency anemia, unspecified: Secondary | ICD-10-CM | POA: Diagnosis not present

## 2019-01-31 DIAGNOSIS — Q057 Lumbar spina bifida without hydrocephalus: Secondary | ICD-10-CM | POA: Diagnosis not present

## 2019-01-31 DIAGNOSIS — M1631 Unilateral osteoarthritis resulting from hip dysplasia, right hip: Secondary | ICD-10-CM | POA: Diagnosis not present

## 2019-01-31 DIAGNOSIS — Z982 Presence of cerebrospinal fluid drainage device: Secondary | ICD-10-CM | POA: Diagnosis not present

## 2019-01-31 DIAGNOSIS — Z87440 Personal history of urinary (tract) infections: Secondary | ICD-10-CM | POA: Diagnosis not present

## 2019-02-01 ENCOUNTER — Other Ambulatory Visit: Payer: Self-pay | Admitting: Internal Medicine

## 2019-02-01 DIAGNOSIS — Z982 Presence of cerebrospinal fluid drainage device: Secondary | ICD-10-CM | POA: Diagnosis not present

## 2019-02-01 DIAGNOSIS — Q057 Lumbar spina bifida without hydrocephalus: Secondary | ICD-10-CM | POA: Diagnosis not present

## 2019-02-01 DIAGNOSIS — D509 Iron deficiency anemia, unspecified: Secondary | ICD-10-CM | POA: Diagnosis not present

## 2019-02-01 DIAGNOSIS — M1631 Unilateral osteoarthritis resulting from hip dysplasia, right hip: Secondary | ICD-10-CM | POA: Diagnosis not present

## 2019-02-01 DIAGNOSIS — Z87440 Personal history of urinary (tract) infections: Secondary | ICD-10-CM | POA: Diagnosis not present

## 2019-02-01 DIAGNOSIS — N319 Neuromuscular dysfunction of bladder, unspecified: Secondary | ICD-10-CM | POA: Diagnosis not present

## 2019-02-05 DIAGNOSIS — Q057 Lumbar spina bifida without hydrocephalus: Secondary | ICD-10-CM | POA: Diagnosis not present

## 2019-02-05 DIAGNOSIS — D509 Iron deficiency anemia, unspecified: Secondary | ICD-10-CM | POA: Diagnosis not present

## 2019-02-05 DIAGNOSIS — M1631 Unilateral osteoarthritis resulting from hip dysplasia, right hip: Secondary | ICD-10-CM | POA: Diagnosis not present

## 2019-02-05 DIAGNOSIS — Z87440 Personal history of urinary (tract) infections: Secondary | ICD-10-CM | POA: Diagnosis not present

## 2019-02-05 DIAGNOSIS — N319 Neuromuscular dysfunction of bladder, unspecified: Secondary | ICD-10-CM | POA: Diagnosis not present

## 2019-02-05 DIAGNOSIS — Z982 Presence of cerebrospinal fluid drainage device: Secondary | ICD-10-CM | POA: Diagnosis not present

## 2019-02-06 DIAGNOSIS — D509 Iron deficiency anemia, unspecified: Secondary | ICD-10-CM | POA: Diagnosis not present

## 2019-02-06 DIAGNOSIS — Z87440 Personal history of urinary (tract) infections: Secondary | ICD-10-CM | POA: Diagnosis not present

## 2019-02-06 DIAGNOSIS — Z982 Presence of cerebrospinal fluid drainage device: Secondary | ICD-10-CM | POA: Diagnosis not present

## 2019-02-06 DIAGNOSIS — N319 Neuromuscular dysfunction of bladder, unspecified: Secondary | ICD-10-CM | POA: Diagnosis not present

## 2019-02-06 DIAGNOSIS — Q057 Lumbar spina bifida without hydrocephalus: Secondary | ICD-10-CM | POA: Diagnosis not present

## 2019-02-06 DIAGNOSIS — M1631 Unilateral osteoarthritis resulting from hip dysplasia, right hip: Secondary | ICD-10-CM | POA: Diagnosis not present

## 2019-02-08 DIAGNOSIS — Z982 Presence of cerebrospinal fluid drainage device: Secondary | ICD-10-CM | POA: Diagnosis not present

## 2019-02-08 DIAGNOSIS — M1631 Unilateral osteoarthritis resulting from hip dysplasia, right hip: Secondary | ICD-10-CM | POA: Diagnosis not present

## 2019-02-08 DIAGNOSIS — N319 Neuromuscular dysfunction of bladder, unspecified: Secondary | ICD-10-CM | POA: Diagnosis not present

## 2019-02-08 DIAGNOSIS — D509 Iron deficiency anemia, unspecified: Secondary | ICD-10-CM | POA: Diagnosis not present

## 2019-02-08 DIAGNOSIS — S32401A Unspecified fracture of right acetabulum, initial encounter for closed fracture: Secondary | ICD-10-CM | POA: Diagnosis not present

## 2019-02-08 DIAGNOSIS — X58XXXA Exposure to other specified factors, initial encounter: Secondary | ICD-10-CM | POA: Diagnosis not present

## 2019-02-08 DIAGNOSIS — Z87440 Personal history of urinary (tract) infections: Secondary | ICD-10-CM | POA: Diagnosis not present

## 2019-02-08 DIAGNOSIS — Z96641 Presence of right artificial hip joint: Secondary | ICD-10-CM | POA: Diagnosis not present

## 2019-02-08 DIAGNOSIS — Q057 Lumbar spina bifida without hydrocephalus: Secondary | ICD-10-CM | POA: Diagnosis not present

## 2019-02-13 DIAGNOSIS — Z982 Presence of cerebrospinal fluid drainage device: Secondary | ICD-10-CM | POA: Diagnosis not present

## 2019-02-13 DIAGNOSIS — D509 Iron deficiency anemia, unspecified: Secondary | ICD-10-CM | POA: Diagnosis not present

## 2019-02-13 DIAGNOSIS — M1631 Unilateral osteoarthritis resulting from hip dysplasia, right hip: Secondary | ICD-10-CM | POA: Diagnosis not present

## 2019-02-13 DIAGNOSIS — Q057 Lumbar spina bifida without hydrocephalus: Secondary | ICD-10-CM | POA: Diagnosis not present

## 2019-02-13 DIAGNOSIS — Z87440 Personal history of urinary (tract) infections: Secondary | ICD-10-CM | POA: Diagnosis not present

## 2019-02-13 DIAGNOSIS — N319 Neuromuscular dysfunction of bladder, unspecified: Secondary | ICD-10-CM | POA: Diagnosis not present

## 2019-02-15 DIAGNOSIS — N319 Neuromuscular dysfunction of bladder, unspecified: Secondary | ICD-10-CM | POA: Diagnosis not present

## 2019-02-15 DIAGNOSIS — Z87440 Personal history of urinary (tract) infections: Secondary | ICD-10-CM | POA: Diagnosis not present

## 2019-02-15 DIAGNOSIS — Z982 Presence of cerebrospinal fluid drainage device: Secondary | ICD-10-CM | POA: Diagnosis not present

## 2019-02-15 DIAGNOSIS — Q057 Lumbar spina bifida without hydrocephalus: Secondary | ICD-10-CM | POA: Diagnosis not present

## 2019-02-15 DIAGNOSIS — M1631 Unilateral osteoarthritis resulting from hip dysplasia, right hip: Secondary | ICD-10-CM | POA: Diagnosis not present

## 2019-02-15 DIAGNOSIS — D509 Iron deficiency anemia, unspecified: Secondary | ICD-10-CM | POA: Diagnosis not present

## 2019-02-18 DIAGNOSIS — M1631 Unilateral osteoarthritis resulting from hip dysplasia, right hip: Secondary | ICD-10-CM | POA: Diagnosis not present

## 2019-02-18 DIAGNOSIS — D509 Iron deficiency anemia, unspecified: Secondary | ICD-10-CM | POA: Diagnosis not present

## 2019-02-18 DIAGNOSIS — N319 Neuromuscular dysfunction of bladder, unspecified: Secondary | ICD-10-CM | POA: Diagnosis not present

## 2019-02-18 DIAGNOSIS — Z982 Presence of cerebrospinal fluid drainage device: Secondary | ICD-10-CM | POA: Diagnosis not present

## 2019-02-18 DIAGNOSIS — Q057 Lumbar spina bifida without hydrocephalus: Secondary | ICD-10-CM | POA: Diagnosis not present

## 2019-02-18 DIAGNOSIS — Z87440 Personal history of urinary (tract) infections: Secondary | ICD-10-CM | POA: Diagnosis not present

## 2019-02-21 DIAGNOSIS — M1631 Unilateral osteoarthritis resulting from hip dysplasia, right hip: Secondary | ICD-10-CM | POA: Diagnosis not present

## 2019-02-21 DIAGNOSIS — D509 Iron deficiency anemia, unspecified: Secondary | ICD-10-CM | POA: Diagnosis not present

## 2019-02-21 DIAGNOSIS — N319 Neuromuscular dysfunction of bladder, unspecified: Secondary | ICD-10-CM | POA: Diagnosis not present

## 2019-02-21 DIAGNOSIS — Z982 Presence of cerebrospinal fluid drainage device: Secondary | ICD-10-CM | POA: Diagnosis not present

## 2019-02-21 DIAGNOSIS — Q057 Lumbar spina bifida without hydrocephalus: Secondary | ICD-10-CM | POA: Diagnosis not present

## 2019-02-21 DIAGNOSIS — Z87440 Personal history of urinary (tract) infections: Secondary | ICD-10-CM | POA: Diagnosis not present

## 2019-02-26 ENCOUNTER — Other Ambulatory Visit: Payer: Self-pay | Admitting: Nurse Practitioner

## 2019-02-26 DIAGNOSIS — D509 Iron deficiency anemia, unspecified: Secondary | ICD-10-CM | POA: Diagnosis not present

## 2019-02-26 DIAGNOSIS — M1631 Unilateral osteoarthritis resulting from hip dysplasia, right hip: Secondary | ICD-10-CM | POA: Diagnosis not present

## 2019-02-26 DIAGNOSIS — N319 Neuromuscular dysfunction of bladder, unspecified: Secondary | ICD-10-CM | POA: Diagnosis not present

## 2019-02-26 DIAGNOSIS — Z87440 Personal history of urinary (tract) infections: Secondary | ICD-10-CM | POA: Diagnosis not present

## 2019-02-26 DIAGNOSIS — Z982 Presence of cerebrospinal fluid drainage device: Secondary | ICD-10-CM | POA: Diagnosis not present

## 2019-02-26 DIAGNOSIS — Q057 Lumbar spina bifida without hydrocephalus: Secondary | ICD-10-CM | POA: Diagnosis not present

## 2019-02-28 ENCOUNTER — Encounter: Payer: Self-pay | Admitting: Internal Medicine

## 2019-02-28 ENCOUNTER — Ambulatory Visit (INDEPENDENT_AMBULATORY_CARE_PROVIDER_SITE_OTHER): Payer: Medicare Other | Admitting: Internal Medicine

## 2019-02-28 ENCOUNTER — Other Ambulatory Visit: Payer: Self-pay

## 2019-02-28 VITALS — BP 134/76 | HR 100 | Temp 98.6°F | Ht <= 58 in | Wt 102.0 lb

## 2019-02-28 DIAGNOSIS — Q059 Spina bifida, unspecified: Secondary | ICD-10-CM | POA: Diagnosis not present

## 2019-02-28 DIAGNOSIS — K5901 Slow transit constipation: Secondary | ICD-10-CM

## 2019-02-28 DIAGNOSIS — Z79899 Other long term (current) drug therapy: Secondary | ICD-10-CM

## 2019-02-28 DIAGNOSIS — K219 Gastro-esophageal reflux disease without esophagitis: Secondary | ICD-10-CM

## 2019-02-28 DIAGNOSIS — R0789 Other chest pain: Secondary | ICD-10-CM | POA: Diagnosis not present

## 2019-02-28 MED ORDER — LINACLOTIDE 72 MCG PO CAPS: 72.0000 ug | ORAL_CAPSULE | Freq: Every day | ORAL | 1 refills | Status: DC

## 2019-02-28 NOTE — Patient Instructions (Signed)
Linzess - Start for constipation - 72mg  -  Take one upon awakening, wait 30 -60 minutes before eating   Nonspecific Chest Pain Chest pain can be caused by many different conditions. Some causes of chest pain can be life-threatening. These will require treatment right away. Serious causes of chest pain include:  Heart attack.  A tear in the body's main blood vessel.  Redness and swelling (inflammation) around your heart.  Blood clot in your lungs. Other causes of chest pain may not be so serious. These include:  Heartburn.  Anxiety or stress.  Damage to bones or muscles in your chest.  Lung infections. Chest pain can feel like:  Pain or discomfort in your chest.  Crushing, pressure, aching, or squeezing pain.  Burning or tingling.  Dull or sharp pain that is worse when you move, cough, or take a deep breath.  Pain or discomfort that is also felt in your back, neck, jaw, shoulder, or arm, or pain that spreads to any of these areas. It is hard to know whether your pain is caused by something that is serious or something that is not so serious. So it is important to see your doctor right away if you have chest pain. Follow these instructions at home: Medicines  Take over-the-counter and prescription medicines only as told by your doctor.  If you were prescribed an antibiotic medicine, take it as told by your doctor. Do not stop taking the antibiotic even if you start to feel better. Lifestyle   Rest as told by your doctor.  Do not use any products that contain nicotine or tobacco, such as cigarettes, e-cigarettes, and chewing tobacco. If you need help quitting, ask your doctor.  Do not drink alcohol.  Make lifestyle changes as told by your doctor. These may include: ? Getting regular exercise. Ask your doctor what activities are safe for you. ? Eating a heart-healthy diet. A diet and nutrition specialist (dietitian) can help you to learn healthy eating options. ?  Staying at a healthy weight. ? Treating diabetes or high blood pressure, if needed. ? Lowering your stress. Activities such as yoga and relaxation techniques can help. General instructions  Pay attention to any changes in your symptoms. Tell your doctor about them or any new symptoms.  Avoid any activities that cause chest pain.  Keep all follow-up visits as told by your doctor. This is important. You may need more testing if your chest pain does not go away. Contact a doctor if:  Your chest pain does not go away.  You feel depressed.  You have a fever. Get help right away if:  Your chest pain is worse.  You have a cough that gets worse, or you cough up blood.  You have very bad (severe) pain in your belly (abdomen).  You pass out (faint).  You have either of these for no clear reason: ? Sudden chest discomfort. ? Sudden discomfort in your arms, back, neck, or jaw.  You have shortness of breath at any time.  You suddenly start to sweat, or your skin gets clammy.  You feel sick to your stomach (nauseous).  You throw up (vomit).  You suddenly feel lightheaded or dizzy.  You feel very weak or tired.  Your heart starts to beat fast, or it feels like it is skipping beats. These symptoms may be an emergency. Do not wait to see if the symptoms will go away. Get medical help right away. Call your local emergency services (911 in  the [place] Do not drive yourself to the hospital. Summary  Chest pain can be caused by many different conditions. The cause may be serious and need treatment right away. If you have chest pain, see your doctor right away.  Follow your doctor's instructions for taking medicines and making lifestyle changes.  Keep all follow-up visits as told by your doctor. This includes visits for any further testing if your chest pain does not go away.  Be sure to know the signs that show that your condition has become worse. Get help right away if you have these  symptoms. This information is not intended to replace advice given to you by your health care provider. Make sure you discuss any questions you have with your health care provider. Document Released: 12/14/2007 Document Revised: 12/28/2017 Document Reviewed: 12/28/2017 Elsevier Patient Education  2020 [employer].

## 2019-03-01 ENCOUNTER — Ambulatory Visit: Payer: Self-pay

## 2019-03-01 DIAGNOSIS — Q057 Lumbar spina bifida without hydrocephalus: Secondary | ICD-10-CM | POA: Diagnosis not present

## 2019-03-01 DIAGNOSIS — K219 Gastro-esophageal reflux disease without esophagitis: Secondary | ICD-10-CM

## 2019-03-01 DIAGNOSIS — D509 Iron deficiency anemia, unspecified: Secondary | ICD-10-CM | POA: Diagnosis not present

## 2019-03-01 DIAGNOSIS — Z982 Presence of cerebrospinal fluid drainage device: Secondary | ICD-10-CM | POA: Diagnosis not present

## 2019-03-01 DIAGNOSIS — Q6589 Other specified congenital deformities of hip: Secondary | ICD-10-CM

## 2019-03-01 DIAGNOSIS — M1712 Unilateral primary osteoarthritis, left knee: Secondary | ICD-10-CM

## 2019-03-01 DIAGNOSIS — Q059 Spina bifida, unspecified: Secondary | ICD-10-CM

## 2019-03-01 DIAGNOSIS — N319 Neuromuscular dysfunction of bladder, unspecified: Secondary | ICD-10-CM | POA: Diagnosis not present

## 2019-03-01 DIAGNOSIS — M1631 Unilateral osteoarthritis resulting from hip dysplasia, right hip: Secondary | ICD-10-CM | POA: Diagnosis not present

## 2019-03-01 DIAGNOSIS — Z87440 Personal history of urinary (tract) infections: Secondary | ICD-10-CM | POA: Diagnosis not present

## 2019-03-01 LAB — CMP14+EGFR
ALT: 15 IU/L (ref 0–32)
AST: 22 IU/L (ref 0–40)
Albumin/Globulin Ratio: 1.7 (ref 1.2–2.2)
Albumin: 4.2 g/dL (ref 3.8–4.9)
Alkaline Phosphatase: 108 IU/L (ref 39–117)
BUN/Creatinine Ratio: 19 (ref 9–23)
BUN: 13 mg/dL (ref 6–24)
Bilirubin Total: <0.2 mg/dL (ref 0.0–1.2)
CO2: 22 mmol/L (ref 20–29)
Calcium: 9.7 mg/dL (ref 8.7–10.2)
Chloride: 103 mmol/L (ref 96–106)
Creatinine, Ser: 0.68 mg/dL (ref 0.57–1.00)
GFR calc Af Amer: 115 mL/min/{1.73_m2} (ref >59)
GFR calc non Af Amer: 99 mL/min/{1.73_m2} (ref >59)
Globulin, Total: 2.5 g/dL (ref 1.5–4.5)
Glucose: 79 mg/dL (ref 65–99)
Potassium: 4.3 mmol/L (ref 3.5–5.2)
Sodium: 139 mmol/L (ref 134–144)
Total Protein: 6.7 g/dL (ref 6.0–8.5)

## 2019-03-01 LAB — CBC
Hematocrit: 36.1 % (ref 34.0–46.6)
Hemoglobin: 12 g/dL (ref 11.1–15.9)
MCH: 28.4 pg (ref 26.6–33.0)
MCHC: 33.2 g/dL (ref 31.5–35.7)
MCV: 85 fL (ref 79–97)
Platelets: 201 10*3/uL (ref 150–450)
RBC: 4.23 x10E6/uL (ref 3.77–5.28)
RDW: 12.4 % (ref 11.7–15.4)
WBC: 4.7 10*3/uL (ref 3.4–10.8)

## 2019-03-01 LAB — H PYLORI, IGM, IGG, IGA AB
H pylori, IgM Abs: <9 units (ref 0.0–8.9)
H. pylori, IgA Abs: <9 units (ref 0.0–8.9)
H. pylori, IgG AbS: 0.1 Index Value (ref 0.00–0.79)

## 2019-03-01 LAB — TSH: TSH: 1.79 u[IU]/mL (ref 0.450–4.500)

## 2019-03-01 NOTE — Chronic Care Management (AMB) (Signed)
  Chronic Care Management   Initial Visit Note  03/01/2019 Name: Mary Neal MRN: [id number] DOB: March 27, 1966  Referred by: Glendale Chard, MD Reason for referral : Chronic Care Management (CCM RNCM Case Collaboration )   Mary Neal is a 53 y.o. year old female who is a primary care patient of Glendale Chard, MD. The care management team was consulted for assistance with chronic disease management and care coordination needs.   Review of patient status, including review of consultants reports, relevant laboratory and other test results, and collaboration with appropriate care team members and the patient's provider was performed as part of comprehensive patient evaluation and provision of chronic care management services.    I initiated and established the plan of care for Mary Neal during one on one collaboration with my clinical care management colleague Daneen Schick BSW who is also engaged with this patient to address social work needs.   Outpatient Encounter Medications as of 03/01/2019  Medication Sig  . Cholecalciferol 1000 UNITS capsule Take 500 Units by mouth daily.  Marland Kitchen linaclotide (LINZESS) 72 MCG capsule Take 1 capsule (72 mcg total) by mouth daily before breakfast.  . Multiple Vitamin (MULTI-VITAMINS) TABS Take by mouth.  Marland Kitchen omeprazole (PRILOSEC) 40 MG capsule TAKE 1 CAPSULE BY MOUTH EVERY DAY  . oxybutynin (DITROPAN XL) 15 MG 24 hr tablet Take 15 mg by mouth 2 (two) times daily.   . polyethylene glycol (MIRALAX) packet Take 17 g by mouth daily as needed for moderate constipation or severe constipation.  . SENNA CO by Combination route.  . vitamin C (ASCORBIC ACID) 500 MG tablet Take by mouth.   No facility-administered encounter medications on file as of 03/01/2019.      Goals Addressed    . Assist with Chronic Care Management and Care Coordination needs       Current Barriers:  Marland Kitchen Knowledge Barriers related to resources and support available to address  needs related to Chronic Care Management and Care Coordination needs  Case Manager Clinical Goal(s):  Marland Kitchen Over the next 30 days, patient will work with the CCM team to address needs related to chronic disease management and care coordination needs  Interventions:  . Collaborated with BSW and initiated plan of care to address needs related to chronic disease management and community resource needs  Patient Self Care Activities:  . Attends all scheduled provider appointments  Initial goal documentation         Telephone follow up appointment with care management team member scheduled for: 03/28/19  Barb Merino, RN, BSN, CCM Care Management Coordinator [place] [facility]  Direct Phone: [phone]

## 2019-03-01 NOTE — Patient Instructions (Signed)
Social Worker Visit Information  Goals we discussed today:  Goals Addressed            This Visit's Progress   . "she needs someone to help with bathing"       Mother stated:  Current Barriers:  . Lacks knowledge of Medicaid PCS benefit . Clinical Diagnosis of Spina Bifida limiting ADL ability . Aging mother/caregiver whom reports difficulty caring for the patient  Clinical Social Work Clinical Goal(s):  Marland Kitchen Over the next 45 days, client will work with SW to address concerns related to ADL limitations  CCM SW Interventions: Completed 03/01/2019 with patient [name] . Outbound call to the patient to assess patient needs related to recent referral for community resources, spoke with the patients mother and primary caregiver Mary Neal . Informed by [name] due to her declining health she is beginning to really struggle with caring for the patient . Determined Mary Neal is interested in caregiver resources to assist with ADL's . Reviewed patient health plan to determine the patient has both Medicare and Medicaid . Educated Mary Neal on [facility] benefits under the [facility] health plan . Explained the process for applying for assistance in the home through [facility] . Obtained verbal consent from Mary Neal to initiate a PCS application . Initiated PCS application on the patients behalf . Collaboration with patients primary provider for application completion and submission to [facility] . Scheduled outbound call to Mary Neal over the next two weeks to assess progression of goal  Patient Self Care Activities:  . Currently UNABLE TO independently perform ADL's  Initial goal documentation     . "we need help with obtaining catheter supplies"       Mother stated:  Current Barriers:  . Recent discharge from home health agency who had been providing supplies . Limited knowledge of how to access alternate DME supply  agencies . Lacks knowledge of type of supplies needed  Clinical Social Work Clinical Goal(s):  Marland Kitchen Over the next 25 days, the patient will work with CCM team to identify and obtain catheter supplies through local DME supplier  CCM SW Interventions: Completed 03/01/2019 with patients [name] . Outbound call to the patient, spoke with Mary Neal . Determined the patient had been receiving incontinent and catheter supplies through a home health agency whom discharged the patient today "they said we couldn't get it through them once they discharged her" . Assessed for knowledge of product type and/or home health agency name - patients mother is unable to recall at this time . Educated Mary Neal on the ability for the CCM team to assist with coordinating delivery through a local DME supplier . Advised [name] to contact CCM SW with product information as soon as she was able to locate  . Coordination with CCM RN Case Manager [place] regarding patient enrollment status and stated goal . Scheduled follow up call to the patient over the next two weeks  Patient Self Care Activities:  . Attends all scheduled provider appointments . Calls provider office for new concerns or questions  Initial goal documentation         Materials provided: Verbal education about health plan benefits provided by phone  [name] was given information about Chronic Care Management services today including:  1. CCM service includes personalized support from designated clinical staff supervised by her physician, including individualized plan of care and coordination with other care providers 2. 24/7  contact phone numbers for assistance for urgent and routine care needs. 3. Service will only be billed when office clinical staff spend 20 minutes or more in a month to coordinate care. 4. Only one practitioner may furnish and bill the service in a calendar month. 5. The patient  may stop CCM services at any time (effective at the end of the month) by phone call to the office staff. 6. The patient will be responsible for cost sharing (co-pay) of up to 20% of the service fee (after annual deductible is met).  Patient agreed to services and verbal consent obtained.   The patient verbalized understanding of instructions provided today and declined a print copy of patient instruction materials.   Follow up plan: SW will follow up with patient by phone over the next two weeks   Daneen Schick, BSW, CDP Social Worker, Certified Dementia Practitioner [place] [facility] [place] [facility] [phone]

## 2019-03-01 NOTE — Chronic Care Management (AMB) (Signed)
Chronic Care Management   Social Work General Note  03/01/2019 Name: ZAYDAH NAWABI MRN: [id number] DOB: [birth year]  [name] is a 53 y.o. year old female who is a primary care patient of Glendale Chard, MD. The CCM was consulted to assist the patient with [employer] .   I placed an outbound call to the patient in response to a recent referral to our chronic care management team. I spoke with the patients mother and primary caregiver Amire Gossen whom is listed on the patients DPR.  Ms. Hollinger was given information about Chronic Care Management services today including:  1. CCM service includes personalized support from designated clinical staff supervised by her physician, including individualized plan of care and coordination with other care providers 2. 24/7 contact phone numbers for assistance for urgent and routine care needs. 3. Service will only be billed when office clinical staff spend 20 minutes or more in a month to coordinate care. 4. Only one practitioner may furnish and bill the service in a calendar month. 5. The patient may stop CCM services at any time (effective at the end of the month) by phone call to the office staff. 6. The patient will be responsible for cost sharing (co-pay) of up to 20% of the service fee (after annual deductible is met).  Patient's mother Suki Crockett agreed to services on behalf of the patient and verbal consent obtained.   Review of patient status, including review of consultants reports, relevant laboratory and other test results, and collaboration with appropriate care team members and the patient's provider was performed as part of comprehensive patient evaluation and provision of chronic care management services.    SDOH (Social Determinants of Health) screening performed today. The patient's mother does not indicate any current SDOH challenges. She does report concern over caregiver strain and DME equipment  needs. See care plan below:  Outpatient Encounter Medications as of 03/01/2019  Medication Sig  . Cholecalciferol 1000 UNITS capsule Take 500 Units by mouth daily.  Marland Kitchen linaclotide (LINZESS) 72 MCG capsule Take 1 capsule (72 mcg total) by mouth daily before breakfast.  . Multiple Vitamin (MULTI-VITAMINS) TABS Take by mouth.  Marland Kitchen omeprazole (PRILOSEC) 40 MG capsule TAKE 1 CAPSULE BY MOUTH EVERY DAY  . oxybutynin (DITROPAN XL) 15 MG 24 hr tablet Take 15 mg by mouth 2 (two) times daily.   . polyethylene glycol (MIRALAX) packet Take 17 g by mouth daily as needed for moderate constipation or severe constipation.  . SENNA CO by Combination route.  . vitamin C (ASCORBIC ACID) 500 MG tablet Take by mouth.   No facility-administered encounter medications on file as of 03/01/2019.     Goals Addressed            This Visit's Progress   . "she needs someone to help with bathing"       Mother stated:  Current Barriers:  . Lacks knowledge of Medicaid PCS benefit . Clinical Diagnosis of Spina Bifida limiting ADL ability . Aging mother/caregiver whom reports difficulty caring for the patient  Clinical Social Work Clinical Goal(s):  Marland Kitchen Over the next 45 days, client will work with SW to address concerns related to ADL limitations  CCM SW Interventions: Completed 03/01/2019 with patient mother Uyen Eichholz . Outbound call to the patient to assess patient needs related to recent referral for community resources, spoke with the patients mother and primary caregiver Petina Muraski . Informed by Mrs. Hadasah Brugger due to her declining health she is  beginning to really struggle with caring for the patient . Determined Mrs. Oubre is interested in caregiver resources to assist with ADL's . Reviewed patient health plan to determine the patient has both Medicare and Medicaid . Educated Mrs. Estill Bakes on [facility] benefits under the [facility] health plan . Explained the process for applying for assistance in  the home through [facility] . Obtained verbal consent from Mrs. Kernan to initiate a PCS application . Initiated PCS application on the patients behalf . Collaboration with patients primary provider for application completion and submission to [facility] . Scheduled outbound call to Mrs. Walla over the next two weeks to assess progression of goal  Patient Self Care Activities:  . Currently UNABLE TO independently perform ADL's  Initial goal documentation     . "we need help with obtaining catheter supplies"       Mother stated:  Current Barriers:  . Recent discharge from home health agency who had been providing supplies . Limited knowledge of how to access alternate DME supply agencies . Lacks knowledge of type of supplies needed  Clinical Social Work Clinical Goal(s):  Marland Kitchen Over the next 25 days, the patient will work with CCM team to identify and obtain catheter supplies through local DME supplier  CCM SW Interventions: Completed 03/01/2019 with patients mother Shizue Kaseman . Outbound call to the patient, spoke with Mrs. Deberah Pelton . Determined the patient had been receiving incontinent and catheter supplies through a home health agency whom discharged the patient today "they said we couldn't get it through them once they discharged her" . Assessed for knowledge of product type and/or home health agency name - patients mother is unable to recall at this time . Educated Mrs. Estill Bakes on the ability for the CCM team to assist with coordinating delivery through a local DME supplier . Advised Mrs. Galvan to contact CCM SW with product information as soon as she was able to locate  . Coordination with CCM RN Case Manager [place] regarding patient enrollment status and stated goal . Scheduled follow up call to the patient over the next two weeks  Patient Self Care Activities:  . Attends all scheduled provider appointments . Calls provider office for  new concerns or questions  Initial goal documentation         Follow Up Plan: SW will follow up with patient by phone over the next two weeks.       Daneen Schick, BSW, CDP Social Worker, Certified Dementia Practitioner [place] [facility] [place] [facility] [phone]  Total time spent performing care coordination and/or care management activities with the patient by phone or face to face = 22 minutes.

## 2019-03-02 NOTE — Progress Notes (Signed)
Subjective:     Patient ID: Mary Neal , female    DOB: 02-Aug-1965 , 53 y.o.   MRN: [id number]   Chief Complaint  Patient presents with  . Gastroesophageal Reflux    HPI  She is here today for for further evaluation of reflux. Her mother is also present for the visit. She reports that the patient burps a lot. Her symptoms have worsened over the past month. Patient admits to having gas pains intermittently. She states these feel like chest pains.  She is unable to state what else exacerbates her symptoms. No associated sob, palpitations.   Gastroesophageal Reflux She complains of belching, chest pain and heartburn. She reports no dysphagia. This is a recurrent problem. The current episode started more than 1 month ago. The problem occurs constantly. The problem has been gradually worsening. The heartburn duration is several minutes. The heartburn is located in the left chest and substernum. The heartburn is of moderate intensity. The heartburn does not wake her from sleep. The heartburn does not limit her activity. The symptoms are aggravated by certain foods. Pertinent negatives include no melena or orthopnea. Risk factors include lack of exercise. She has tried a PPI for the symptoms. The treatment provided no relief.     Past Medical History:  Diagnosis Date  . Abnormal Pap smear 2013  . Dislocation of right hip ([place]) 06/09/2018   Per MRI  . Spina bifida ([place])   . Uterine prolapse   . Wears glasses      Family History  Problem Relation Age of Onset  . Hypertension Mother      Current Outpatient Medications:  .  Cholecalciferol 1000 UNITS capsule, Take 500 Units by mouth daily., Disp: , Rfl:  .  Multiple Vitamin (MULTI-VITAMINS) TABS, Take by mouth., Disp: , Rfl:  .  omeprazole (PRILOSEC) 40 MG capsule, TAKE 1 CAPSULE BY MOUTH EVERY DAY, Disp: 30 capsule, Rfl: 1 .  oxybutynin (DITROPAN XL) 15 MG 24 hr tablet, Take 15 mg by mouth 2 (two) times daily. , Disp: , Rfl:  .   polyethylene glycol (MIRALAX) packet, Take 17 g by mouth daily as needed for moderate constipation or severe constipation., Disp: 14 each, Rfl: 0 .  SENNA CO, by Combination route., Disp: , Rfl:  .  vitamin C (ASCORBIC ACID) 500 MG tablet, Take by mouth., Disp: , Rfl:  .  linaclotide (LINZESS) 72 MCG capsule, Take 1 capsule (72 mcg total) by mouth daily before breakfast., Disp: 30 capsule, Rfl: 1   Allergies  Allergen Reactions  . Avocado Other (See Comments)  . Banana Other (See Comments)  . Latex Other (See Comments)  . Penicillins Other (See Comments)     Review of Systems  Constitutional: Negative.   Respiratory: Negative.   Cardiovascular: Positive for chest pain.  Gastrointestinal: Positive for heartburn. Negative for dysphagia and melena.       Heartburn  Neurological: Negative.   Psychiatric/Behavioral: Negative.      Today's Vitals   02/28/19 1058  BP: 134/76  Pulse: 100  Temp: 98.6 F (37 C)  TempSrc: Oral  Weight: 102 lb (46.3 kg)  Height: 4' 9"  (1.448 m)  PainSc: 0-No pain   Body mass index is 22.07 kg/m.   Objective:  Physical Exam Vitals signs and nursing note (uses crutches for ambulation) reviewed.  Constitutional:      Appearance: Normal appearance.  HENT:     Head: Normocephalic and atraumatic.  Cardiovascular:     Rate and  Rhythm: Normal rate and regular rhythm.     Heart sounds: Normal heart sounds.  Pulmonary:     Effort: Pulmonary effort is normal.     Breath sounds: Normal breath sounds.  Abdominal:     General: Bowel sounds are normal.     Palpations: Abdomen is soft.  Skin:    General: Skin is warm.  Neurological:     General: No focal deficit present.     Mental Status: She is alert.  Psychiatric:        Mood and Affect: Mood normal.        Behavior: Behavior normal.         Assessment And Plan:     1. Gastroesophageal reflux disease without esophagitis  Worsening symptoms. I will check h. Pylori ab.  I will also increase  her omeprazole to 71m daily. She will rto in four weeks for re-evaluation. She will call sooner should her sx persist.   - H Pylori, IGM, IGG, IGA AB  2. Atypical chest pain  EKG performed. I do think her sx are due to GI issues; however, there Is a chance this is cardiac. I will pursue workup should her sx persist.  - EKG 12-Lead - CBC no Diff  3. Spina bifida without hydrocephalus, unspecified spinal region ([place]  Chronic, yet stable.   - Referral to Chronic Care Management Services  4. Slow transit constipation  Chronic. She was given sample of Linzess 752mto take once daily. She is advised to take upon awakening. She will rto in four weeks for re-evaluation.   - TSH  5. Drug therapy  - CMP14+EGFR        RoMaximino GreenlandMD    THE PATIENT IS ENCOURAGED TO PRACTICE SOCIAL DISTANCING DUE TO THE COVID-19 PANDEMIC.

## 2019-03-04 ENCOUNTER — Ambulatory Visit (INDEPENDENT_AMBULATORY_CARE_PROVIDER_SITE_OTHER): Payer: Medicare Other

## 2019-03-04 DIAGNOSIS — M1712 Unilateral primary osteoarthritis, left knee: Secondary | ICD-10-CM

## 2019-03-04 DIAGNOSIS — Q059 Spina bifida, unspecified: Secondary | ICD-10-CM

## 2019-03-04 NOTE — Chronic Care Management (AMB) (Signed)
Chronic Care Management    Social Work Follow Up Note  03/04/2019 Name: Mary Neal MRN: [id number] DOB: 08-06-1965  Mary Neal is a 53 y.o. year old female who is a primary care patient of Glendale Chard, MD. The CCM team was consulted for assistance with [employer] .   Review of patient status, including review of consultants reports, other relevant assessments, and collaboration with appropriate care team members and the patient's provider was performed as part of comprehensive patient evaluation and provision of chronic care management services.     Outpatient Encounter Medications as of 03/04/2019  Medication Sig  . Cholecalciferol 1000 UNITS capsule Take 500 Units by mouth daily.  Marland Kitchen linaclotide (LINZESS) 72 MCG capsule Take 1 capsule (72 mcg total) by mouth daily before breakfast.  . Multiple Vitamin (MULTI-VITAMINS) TABS Take by mouth.  Marland Kitchen omeprazole (PRILOSEC) 40 MG capsule TAKE 1 CAPSULE BY MOUTH EVERY DAY  . oxybutynin (DITROPAN XL) 15 MG 24 hr tablet Take 15 mg by mouth 2 (two) times daily.   . polyethylene glycol (MIRALAX) packet Take 17 g by mouth daily as needed for moderate constipation or severe constipation.  . SENNA CO by Combination route.  . vitamin C (ASCORBIC ACID) 500 MG tablet Take by mouth.   No facility-administered encounter medications on file as of 03/04/2019.      Goals Addressed            This Visit's Progress   . COMPLETED: "we need help with obtaining catheter supplies"       Mother stated:  Current Barriers:  . Recent discharge from home health agency who had been providing supplies . Limited knowledge of how to access alternate DME supply agencies . Lacks knowledge of type of supplies needed  Clinical Social Work Clinical Goal(s):  Marland Kitchen Over the next 25 days, the patient will work with CCM team to identify and obtain catheter supplies through local DME supplier  CCM SW Interventions: Completed 03/04/2019 with patients  [name] . Outbound call to Mrs. Deberah Pelton in response to a voice message received to provide catheter information . Informed by Mrs. Pomar the patient has been receiving supplies from [employer] that is ordered by the patients specialist Dr. Dina Rich . Assessed for current barriers to obtaining supplies that the CCM team could assist with . Determined the patient is still currently receiving supplies but if she were to be hospitalized or receive home health RN services she would no longer be eligible. Mrs. Beaudoin is concerned that enrolling in the CCM program the patient will no longer be eligible to receive supplies . Educated Mrs. Campbell that the CCM program does not take the place of home health and is performed telephonically .  Encouraged Mrs. Sciullo to continue as she has been for the patient and to let the CCM team if she encounters any barriers regarding catheter supplies . Collaboration with CCM RN Case Manager via in [employer] updating on patient goal status  Patient Self Care Activities:  . Attends all scheduled provider appointments . Calls provider office for new concerns or questions  Please see past updates related to this goal by clicking on the "Past Updates" button in the selected goal          Follow Up Plan: SW will follow up with patient by phone over the next two weeks to continue assisting with PCS application process   Daneen Schick, BSW, CDP Social Worker, Transport planner [facility]  Care Management [phone]  Total time spent performing care coordination and/or care management activities with the patient by phone or face to face = 12 minutes.

## 2019-03-04 NOTE — Patient Instructions (Signed)
Social Worker Visit Information  Goals we discussed today:  Goals Addressed            This Visit's Progress   . COMPLETED: "we need help with obtaining catheter supplies"       Mother stated:  Current Barriers:  . Recent discharge from home health agency who had been providing supplies . Limited knowledge of how to access alternate DME supply agencies . Lacks knowledge of type of supplies needed  Clinical Social Work Clinical Goal(s):  Marland Kitchen Over the next 25 days, the patient will work with CCM team to identify and obtain catheter supplies through local DME supplier  CCM SW Interventions: Completed 03/04/2019 with patients mother Paizli Westerlund . Outbound call to Mrs. Deberah Pelton in response to a voice message received to provide catheter information . Informed by Mrs. Narain the patient has been receiving supplies from [employer] that is ordered by the patients specialist Dr. Dina Rich . Assessed for current barriers to obtaining supplies that the CCM team could assist with . Determined the patient is still currently receiving supplies but if she were to be hospitalized or receive home health RN services she would no longer be eligible. Mrs. Charo is concerned that enrolling in the CCM program the patient will no longer be eligible to receive supplies . Educated Mrs. Belin that the CCM program does not take the place of home health and is performed telephonically .  Encouraged Mrs. Laszewski to continue as she has been for the patient and to let the CCM team if she encounters any barriers regarding catheter supplies . Collaboration with CCM RN Case Manager via in [employer] updating on patient goal status  Patient Self Care Activities:  . Attends all scheduled provider appointments . Calls provider office for new concerns or questions  Please see past updates related to this goal by clicking on the "Past Updates" button in the selected goal         Follow Up Plan:  SW will follow up with patient by phone over the next two weeks to assist with caregiver application process  Daneen Schick, BSW, CDP Social Worker, Certified Dementia Practitioner [place] [facility] [place] [facility] [phone]

## 2019-03-06 ENCOUNTER — Ambulatory Visit: Payer: Self-pay

## 2019-03-06 DIAGNOSIS — M1712 Unilateral primary osteoarthritis, left knee: Secondary | ICD-10-CM

## 2019-03-06 DIAGNOSIS — Q059 Spina bifida, unspecified: Secondary | ICD-10-CM

## 2019-03-06 LAB — SPECIMEN STATUS REPORT

## 2019-03-06 NOTE — Patient Instructions (Signed)
Social Worker Visit Information  Goals we discussed today:  Goals Addressed            This Visit's Progress   . "she needs someone to help with bathing"   On track    Mother stated:  Current Barriers:  . Lacks knowledge of Medicaid PCS benefit . Clinical Diagnosis of Spina Bifida limiting ADL ability . Aging mother/caregiver whom reports difficulty caring for the patient  Clinical Social Work Clinical Goal(s):  Marland Kitchen Over the next 45 days, client will work with SW to address concerns related to ADL limitations  CCM SW Interventions: Completed 03/06/2019  . Collaborated with patients primary physician via phone to review PCS application . Assisted with completion of application . Scheduled follow up call to the patient over the next two weeks to assist with PCS application process  Patient Self Care Activities:  . Currently UNABLE TO independently perform ADL's  Please see past updates related to this goal by clicking on the "Past Updates" button in the selected goal           Follow Up Plan: SW will follow up with patient by phone over the next two weeks.   Daneen Schick, BSW, CDP Social Worker, Certified Dementia Practitioner [place] / [place] [facility] [phone]

## 2019-03-06 NOTE — Chronic Care Management (AMB) (Signed)
  Chronic Care Management     Social Work Follow Up Note  03/06/2019 Name: Mary Neal MRN: [id number] DOB: 1965-08-27  Mary Neal is a 53 y.o. year old female who is a primary care patient of Mary Chard, MD. The CCM team was consulted for assistance with Mary Neal .   Review of patient status, including review of consultants reports, other relevant assessments, and collaboration with appropriate care team members and the patient's provider was performed as part of comprehensive patient evaluation and provision of chronic care management services.    Collaborative call with patients provider to address questions surround PCS application. See care plan below:  Outpatient Encounter Medications as of 03/06/2019  Medication Sig  . Cholecalciferol 1000 UNITS capsule Take 500 Units by mouth daily.  Marland Kitchen linaclotide (LINZESS) 72 MCG capsule Take 1 capsule (72 mcg total) by mouth daily before breakfast.  . Multiple Vitamin (MULTI-VITAMINS) TABS Take by mouth.  Marland Kitchen omeprazole (PRILOSEC) 40 MG capsule TAKE 1 CAPSULE BY MOUTH EVERY DAY  . oxybutynin (DITROPAN XL) 15 MG 24 hr tablet Take 15 mg by mouth 2 (two) times daily.   . polyethylene glycol (MIRALAX) packet Take 17 g by mouth daily as needed for moderate constipation or severe constipation.  . SENNA CO by Combination route.  . vitamin C (ASCORBIC ACID) 500 MG tablet Take by mouth.   No facility-administered encounter medications on file as of 03/06/2019.      Goals Addressed            This Visit's Progress   . "she needs someone to help with bathing"   On track    Mother stated:  Current Barriers:  . Lacks knowledge of Medicaid PCS benefit . Clinical Diagnosis of Spina Bifida limiting ADL ability . Aging mother/caregiver whom reports difficulty caring for the patient  Clinical Social Work Clinical Goal(s):  Marland Kitchen Over the next 45 days, client will work with SW to address concerns related to ADL limitations  CCM  SW Interventions: Completed 03/06/2019  . Collaborated with patients primary physician via phone to review PCS application . Assisted with completion of application . Scheduled follow up call to the patient over the next two weeks to assist with PCS application process  Patient Self Care Activities:  . Currently UNABLE TO independently perform ADL's  Please see past updates related to this goal by clicking on the "Past Updates" button in the selected goal          Follow Up Plan: SW will follow up with patient by phone over the next two weeks.  Daneen Schick, BSW, CDP Social Worker, Certified Dementia Practitioner [place] [facility] [place] [facility] [phone]  Total time spent performing care coordination and/or care management activities with the patient by phone or face to face = 5 minutes.

## 2019-03-15 ENCOUNTER — Telehealth: Payer: Self-pay

## 2019-03-21 ENCOUNTER — Ambulatory Visit: Payer: Medicare Other

## 2019-03-21 DIAGNOSIS — Q059 Spina bifida, unspecified: Secondary | ICD-10-CM

## 2019-03-21 DIAGNOSIS — M1712 Unilateral primary osteoarthritis, left knee: Secondary | ICD-10-CM

## 2019-03-21 NOTE — Patient Instructions (Signed)
Social Worker Visit Information  Goals we discussed today:  Goals Addressed            This Visit's Progress   . "she needs someone to help with bathing"       Mother stated:  Current Barriers:  . Lacks knowledge of Medicaid PCS benefit . Clinical Diagnosis of Spina Bifida limiting ADL ability . Aging mother/caregiver whom reports difficulty caring for the patient  Clinical Social Work Clinical Goal(s):  Marland Kitchen Over the next 45 days, client will work with SW to address concerns related to ADL limitations  CCM SW Interventions: Completed 03/21/2019 with Deberah Pelton  . Outbound call placed to the patients mother to assess progression of patient goal . Determined the patient has yet to receive any contact from [facility] regarding PCS hours . Advised Mrs. Qualley CCM SW would follow up with [facility] to confirm receipt of PCS application and inquire when the patient would receive an assessment . Outbound call placed to [facility] SW left call back number after being on hold for over 15 minutes due to high call volumes . Scheduled follow up call to the patient over the next week once  SW able to determine PCS application status  Patient Self Care Activities:  . Currently UNABLE TO independently perform ADL's  Please see past updates related to this goal by clicking on the "Past Updates" button in the selected goal          Follow Up Plan: SW will follow up with patient by phone over the next week.   Daneen Schick, BSW, CDP Social Worker, Certified Dementia Practitioner [place] / [place] [facility] [phone]

## 2019-03-21 NOTE — Chronic Care Management (AMB) (Signed)
  Chronic Care Management    Social Work Follow Up Note  03/21/2019 Name: CAROLLE DUELL MRN: [id number] DOB: 11-01-1965  [name] is a 53 y.o. year old female who is a primary care patient of Glendale Chard, MD. The CCM team was consulted for assistance with [employer] .   Review of patient status, including review of consultants reports, other relevant assessments, and collaboration with appropriate care team members and the patient's provider was performed as part of comprehensive patient evaluation and provision of chronic care management services.     Outpatient Encounter Medications as of 03/21/2019  Medication Sig  . Cholecalciferol 1000 UNITS capsule Take 500 Units by mouth daily.  Marland Kitchen linaclotide (LINZESS) 72 MCG capsule Take 1 capsule (72 mcg total) by mouth daily before breakfast.  . Multiple Vitamin (MULTI-VITAMINS) TABS Take by mouth.  Marland Kitchen omeprazole (PRILOSEC) 40 MG capsule TAKE 1 CAPSULE BY MOUTH EVERY DAY  . oxybutynin (DITROPAN XL) 15 MG 24 hr tablet Take 15 mg by mouth 2 (two) times daily.   . polyethylene glycol (MIRALAX) packet Take 17 g by mouth daily as needed for moderate constipation or severe constipation.  . SENNA CO by Combination route.  . vitamin C (ASCORBIC ACID) 500 MG tablet Take by mouth.   No facility-administered encounter medications on file as of 03/21/2019.      Goals Addressed            This Visit's Progress   . "she needs someone to help with bathing"       Mother stated:  Current Barriers:  . Lacks knowledge of Medicaid PCS benefit . Clinical Diagnosis of Spina Bifida limiting ADL ability . Aging mother/caregiver whom reports difficulty caring for the patient  Clinical Social Work Clinical Goal(s):  Marland Kitchen Over the next 45 days, client will work with SW to address concerns related to ADL limitations  CCM SW Interventions: Completed 03/21/2019 with Deberah Pelton  . Outbound call placed to the patients mother to assess  progression of patient goal . Determined the patient has yet to receive any contact from [facility] regarding PCS hours . Advised Mrs. Maxson CCM SW would follow up with [facility] to confirm receipt of PCS application and inquire when the patient would receive an assessment . Outbound call placed to [facility]. SW left call back number after being on hold for over 15 minutes due to high call volumes . Scheduled follow up call to the patient over the next week once  SW able to determine PCS application status  Patient Self Care Activities:  . Currently UNABLE TO independently perform ADL's  Please see past updates related to this goal by clicking on the "Past Updates" button in the selected goal          Follow Up Plan: SW will follow up with patient by phone over the next week regarding PCS application status.   Daneen Schick, BSW, CDP Social Worker, Certified Dementia Practitioner [place] [facility] [place] [facility] [phone]  Total time spent performing care coordination and/or care management activities with the patient by phone or face to face = 20 minutes.

## 2019-03-22 ENCOUNTER — Ambulatory Visit: Payer: Self-pay

## 2019-03-22 DIAGNOSIS — M1712 Unilateral primary osteoarthritis, left knee: Secondary | ICD-10-CM

## 2019-03-22 DIAGNOSIS — Q059 Spina bifida, unspecified: Secondary | ICD-10-CM

## 2019-03-22 NOTE — Chronic Care Management (AMB) (Signed)
  Chronic Care Management    Social Work Follow Up Note  03/22/2019 Name: Mary Neal MRN: [id number] DOB: 1966-06-15  Mary Neal is a 53 y.o. year old female who is a primary care patient of Glendale Chard, MD. The CCM team was consulted for assistance with [employer] .   Review of patient status, including review of consultants reports, other relevant assessments, and collaboration with appropriate care team members and the patient's provider was performed as part of comprehensive patient evaluation and provision of chronic care management services.     Outpatient Encounter Medications as of 03/22/2019  Medication Sig  . Cholecalciferol 1000 UNITS capsule Take 500 Units by mouth daily.  Marland Kitchen linaclotide (LINZESS) 72 MCG capsule Take 1 capsule (72 mcg total) by mouth daily before breakfast.  . Multiple Vitamin (MULTI-VITAMINS) TABS Take by mouth.  Marland Kitchen omeprazole (PRILOSEC) 40 MG capsule TAKE 1 CAPSULE BY MOUTH EVERY DAY  . oxybutynin (DITROPAN XL) 15 MG 24 hr tablet Take 15 mg by mouth 2 (two) times daily.   . polyethylene glycol (MIRALAX) packet Take 17 g by mouth daily as needed for moderate constipation or severe constipation.  . SENNA CO by Combination route.  . vitamin C (ASCORBIC ACID) 500 MG tablet Take by mouth.   No facility-administered encounter medications on file as of 03/22/2019.      Goals Addressed            This Visit's Progress   . "she needs someone to help with bathing"   On track    Mother stated:  Current Barriers:  . Lacks knowledge of Medicaid PCS benefit . Clinical Diagnosis of Spina Bifida limiting ADL ability . Aging mother/caregiver whom reports difficulty caring for the patient  Clinical Social Work Clinical Goal(s):  Marland Kitchen Over the next 45 days, client will work with SW to address concerns related to ADL limitations  CCM SW Interventions: Completed 03/22/2019 with Deberah Pelton  . Collaboration with Tobin Chad from [facility] regarding patient PCS application who stated the patient completed her assessment on 8/31 . Determined the patient must choose a PCS provider in order to move forward with a caregiver . Inquired number of hours the patient has been awarded, informed by Tobin Chad "we can't determine that until a provider is selected" . Outbound call to the patient, spoke with Deberah Pelton . Provided above information via verbal instruction as well as a list of PCS providers via secure e-mail . Advised Avagail Lusardi to contact [facility] with [facility] provider selection in order to move forward with application process . Scheduled follow up cal over the next month to assess progress of patient goal  Patient Self Care Activities:  . Currently UNABLE TO independently perform ADL's  Please see past updates related to this goal by clicking on the "Past Updates" button in the selected goal          Follow Up Plan: SW will follow up with patient by phone over the next week.  Daneen Schick, BSW, CDP Social Worker, Certified Dementia Practitioner [place] [facility] [place] [facility] [phone]  Total time spent performing care coordination and/or care management activities with the patient by phone or face to face = 12 minutes.

## 2019-03-22 NOTE — Patient Instructions (Signed)
Social Worker Visit Information  Goals we discussed today:  Goals Addressed            This Visit's Progress   . "she needs someone to help with bathing"   On track    Mother stated:  Current Barriers:  . Lacks knowledge of Medicaid PCS benefit . Clinical Diagnosis of Spina Bifida limiting ADL ability . Aging mother/caregiver whom reports difficulty caring for the patient  Clinical Social Work Clinical Goal(s):  Marland Kitchen Over the next 45 days, client will work with SW to address concerns related to ADL limitations  CCM SW Interventions: Completed 03/22/2019 with Deberah Pelton  . Collaboration with Tobin Chad from [facility] regarding patient PCS application who stated the patient completed her assessment on 8/31 . Determined the patient must choose a PCS provider in order to move forward with a caregiver . Inquired number of hours the patient has been awarded, informed by Tobin Chad "we can't determine that until a provider is selected" . Outbound call to the patient, spoke with Deberah Pelton . Provided above information via verbal instruction as well as a list of PCS providers via secure e-mail . Advised Yiran Shaban to contact [facility] with [facility] provider selection in order to move forward with application process . Scheduled follow up cal over the next month to assess progress of patient goal  Patient Self Care Activities:  . Currently UNABLE TO independently perform ADL's  Please see past updates related to this goal by clicking on the "Past Updates" button in the selected goal          Materials Provided: Yes: provided the patient with PCS information  Follow Up Plan: SW will follow up with patient by phone over the next week   Daneen Schick, BSW, CDP Social Worker, Certified Dementia Practitioner [place] [facility] [place] [facility] [phone]

## 2019-03-27 ENCOUNTER — Ambulatory Visit: Payer: Medicare Other

## 2019-03-27 DIAGNOSIS — Q6589 Other specified congenital deformities of hip: Secondary | ICD-10-CM

## 2019-03-27 DIAGNOSIS — M1712 Unilateral primary osteoarthritis, left knee: Secondary | ICD-10-CM

## 2019-03-27 DIAGNOSIS — Q059 Spina bifida, unspecified: Secondary | ICD-10-CM

## 2019-03-27 NOTE — Patient Instructions (Signed)
Social Worker Visit Information  Goals we discussed today:  Goals Addressed            This Visit's Progress   . "she needs someone to help with bathing"       Mother stated:  Current Barriers:  . Lacks knowledge of Medicaid PCS benefit . Clinical Diagnosis of Spina Bifida limiting ADL ability . Aging mother/caregiver whom reports difficulty caring for the patient  Clinical Social Work Clinical Goal(s):  Marland Kitchen Over the next 45 days, client will work with SW to address concerns related to ADL limitations  CCM SW Interventions: Completed 03/27/2019 with Deberah Pelton  . Outbound call placed to the patient and her caregiver Garlene Augsburger to assess progression of patient goal . Confirmed receipt of information provided via secure e-mail regarding PCS Provider options . Determined the patient has selected two PCS provider options but is having difficulty choosing a third . Encouraged Deberah Pelton to contact [facility] with the two agencies the patient has selected to begin the process . Educated Mrs Taulbee that the reason to select up to three agencies is due to the possibility that some options may not be able to meet the patients needs as outlined in the RN assessment . Provided Mrs. Campen with the contact number to [facility] . Scheduled follow up call over the next week to assess progress of patient goal  Patient Self Care Activities:  . Currently UNABLE TO independently perform ADL's  Please see past updates related to this goal by clicking on the "Past Updates" button in the selected goal          Materials Provided: Verbal education about [facility] provided by phone  Follow Up Plan: SW will follow up with patient by phone over the next week.   Daneen Schick, BSW, CDP Social Worker, Certified Dementia Practitioner [place] [facility] [place] [facility] [phone]

## 2019-03-27 NOTE — Chronic Care Management (AMB) (Signed)
  Chronic Care Management     Social Work Follow Up Note  03/27/2019 Name: DELARA MARKWORTH MRN: [id number] DOB: 1965-10-31  [name] is a 53 y.o. year old female who is a primary care patient of Glendale Chard, MD. The CCM team was consulted for assistance with [employer] .   Review of patient status, including review of consultants reports, other relevant assessments, and collaboration with appropriate care team members and the patient's provider was performed as part of comprehensive patient evaluation and provision of chronic care management services.     Outpatient Encounter Medications as of 03/27/2019  Medication Sig  . Cholecalciferol 1000 UNITS capsule Take 500 Units by mouth daily.  Marland Kitchen linaclotide (LINZESS) 72 MCG capsule Take 1 capsule (72 mcg total) by mouth daily before breakfast.  . Multiple Vitamin (MULTI-VITAMINS) TABS Take by mouth.  Marland Kitchen omeprazole (PRILOSEC) 40 MG capsule TAKE 1 CAPSULE BY MOUTH EVERY DAY  . oxybutynin (DITROPAN XL) 15 MG 24 hr tablet Take 15 mg by mouth 2 (two) times daily.   . polyethylene glycol (MIRALAX) packet Take 17 g by mouth daily as needed for moderate constipation or severe constipation.  . SENNA CO by Combination route.  . vitamin C (ASCORBIC ACID) 500 MG tablet Take by mouth.   No facility-administered encounter medications on file as of 03/27/2019.      Goals Addressed            This Visit's Progress   . "she needs someone to help with bathing"       Mother stated:  Current Barriers:  . Lacks knowledge of Medicaid PCS benefit . Clinical Diagnosis of Spina Bifida limiting ADL ability . Aging mother/caregiver whom reports difficulty caring for the patient  Clinical Social Work Clinical Goal(s):  Marland Kitchen Over the next 45 days, client will work with SW to address concerns related to ADL limitations  CCM SW Interventions: Completed 03/27/2019 with Deberah Pelton  . Outbound call placed to the patient and her caregiver  Ladasha Copelan to assess progression of patient goal . Confirmed receipt of information provided via secure e-mail regarding PCS Provider options . Determined the patient has selected two PCS provider options but is having difficulty choosing a third . Encouraged Deberah Pelton to contact [facility] with the two agencies the patient has selected to begin the process . Educated Mrs Lasseter that the reason to select up to three agencies is due to the possibility that some options may not be able to meet the patients needs as outlined in the RN assessment . Provided Mrs. Rechner with the contact number to [facility] . Scheduled follow up call over the next week to assess progress of patient goal  Patient Self Care Activities:  . Currently UNABLE TO independently perform ADL's  Please see past updates related to this goal by clicking on the "Past Updates" button in the selected goal          Follow Up Plan: SW will follow up with patient by phone over the next week.   Daneen Schick, BSW, CDP Social Worker, Certified Dementia Practitioner [place] [facility] [place] [facility] [phone]  Total time spent performing care coordination and/or care management activities with the patient by phone or face to face = 9 minutes.

## 2019-03-28 ENCOUNTER — Other Ambulatory Visit: Payer: Self-pay | Admitting: Internal Medicine

## 2019-03-28 ENCOUNTER — Telehealth: Payer: Medicare Other

## 2019-03-29 ENCOUNTER — Ambulatory Visit (INDEPENDENT_AMBULATORY_CARE_PROVIDER_SITE_OTHER): Payer: Medicare Other

## 2019-03-29 DIAGNOSIS — K219 Gastro-esophageal reflux disease without esophagitis: Secondary | ICD-10-CM

## 2019-03-29 DIAGNOSIS — M1712 Unilateral primary osteoarthritis, left knee: Secondary | ICD-10-CM | POA: Diagnosis not present

## 2019-03-29 DIAGNOSIS — Q059 Spina bifida, unspecified: Secondary | ICD-10-CM

## 2019-04-01 ENCOUNTER — Ambulatory Visit: Payer: Medicare Other

## 2019-04-01 DIAGNOSIS — M1712 Unilateral primary osteoarthritis, left knee: Secondary | ICD-10-CM

## 2019-04-01 DIAGNOSIS — G8220 Paraplegia, unspecified: Secondary | ICD-10-CM

## 2019-04-01 DIAGNOSIS — Q059 Spina bifida, unspecified: Secondary | ICD-10-CM

## 2019-04-01 NOTE — Chronic Care Management (AMB) (Signed)
  Chronic Care Management     Social Work Follow Up Note  04/01/2019 Name: Mary Neal MRN: [id number] DOB: [birth year]  [name] is a 53 y.o. year old female who is a primary care patient of Glendale Chard, MD. The CCM team was consulted for assistance with Care[employer]iver [employer].   Review of patient status, including review of consultants reports, other relevant assessments, and collaboration with appropriate care team members and the patient's provider was performed as part of comprehensive patient evaluation and provision of chronic care management services.     Outpatient Encounter Medications as of 04/01/2019  Medication Sig  . Cholecalciferol 1000 UNITS capsule Take 500 Units by mouth daily.  Marland Kitchen linaclotide (LINZESS) 72 MCG capsule Take 1 capsule (72 mcg total) by mouth daily before breakfast.  . Multiple Vitamin (MULTI-VITAMINS) TABS Take by mouth.  Marland Kitchen omeprazole (PRILOSEC) 40 MG capsule TAKE 1 CAPSULE BY MOUTH EVERY DAY  . oxybutynin (DITROPAN XL) 15 MG 24 hr tablet Take 15 mg by mouth 2 (two) times daily.   . polyethylene glycol (MIRALAX) packet Take 17 g by mouth daily as needed for moderate constipation or severe constipation.  . SENNA CO by Combination route.  . vitamin C (ASCORBIC ACID) 500 MG tablet Take by mouth.   No facility-administered encounter medications on file as of 04/01/2019.      Goals Addressed            This Visit's Progress   . "she needs someone to help with bathing"       Mother stated:  Current Barriers:  . Lacks knowledge of Medicaid PCS benefit . Clinical Diagnosis of Spina Bifida limiting ADL ability . Aging mother/caregiver whom reports difficulty caring for the patient  Clinical Social Work Clinical Goal(s):  Marland Kitchen Over the next 45 days, client will work with SW to address concerns related to ADL limitations  CCM SW Interventions: Completed 04/01/2019 with Deberah Pelton  . Outbound call placed to the patient and her caregiver  [name] to assess progression of patient goal . Determined the patient and her mother have yet to contact [facility] to select a [facility] Provider . Assisted [name] in contacting [facility] by conducting a conference call. Unfortunately, the wait times were lengthy so [name] opted to have [facility] return her call . Advised [name] that when she receives a return call to provide the patients Medicaid ID number along with the [facility] Providers they have selected . Encouraged [name] to contact CCM SW if further assistance is needed . Scheduled follow up call over the next week to continue assisting with goal progression  Patient Self Care Activities:  . Currently UNABLE TO independently perform ADL's  Please see past updates related to this goal by clicking on the "Past Updates" button in the selected goal          Follow Up Plan: SW will follow up with patient by phone over the next week.   Daneen Schick, BSW, CDP Social Worker, Certified Dementia Practitioner [place] [facility] [place] [facility] [phone]  Total time spent performing care coordination and/or care management activities with the patient by phone or face to face = 14 minutes.

## 2019-04-01 NOTE — Patient Instructions (Signed)
Social Worker Visit Information  Goals we discussed today:  Goals Addressed            This Visit's Progress   . "she needs someone to help with bathing"       Mother stated:  Current Barriers:  . Lacks knowledge of Medicaid PCS benefit . Clinical Diagnosis of Spina Bifida limiting ADL ability . Aging mother/caregiver whom reports difficulty caring for the patient  Clinical Social Work Clinical Goal(s):  Marland Kitchen Over the next 45 days, client will work with SW to address concerns related to ADL limitations  CCM SW Interventions: Completed 04/01/2019 with Deberah Pelton  . Outbound call placed to the patient and her caregiver Kenley Hoxit to assess progression of patient goal . Determined the patient and her mother have yet to contact [facility] to select a [facility] Provider . Assisted Mrs. Derocco in contacting [facility] by conducting a conference call. Unfortunately, the wait times were lengthy so Mrs Rourk opted to have [facility] return her call . Advised Mrs. Marucci that when she receives a return call to provide the patients Medicaid ID number along with the [facility] Providers they have selected . Encouraged Mrs. Mcglathery to contact CCM SW if further assistance is needed . Scheduled follow up call over the next week to continue assisting with goal progression  Patient Self Care Activities:  . Currently UNABLE TO independently perform ADL's  Please see past updates related to this goal by clicking on the "Past Updates" button in the selected goal          Materials Provided: Verbal education about PCS program provided by phone  Follow Up Plan: SW will follow up with patient by phone over the next week.   Daneen Schick, BSW, CDP Social Worker, Certified Dementia Practitioner [place] [facility] [place] [facility] [phone]

## 2019-04-01 NOTE — Patient Instructions (Signed)
Visit Information  Goals Addressed    . "We need a Cardiology referral"       Mother stated Current Barriers:  Marland Kitchen Knowledge Deficits related to diagnosis and treatment for intermittent chest pain   Nurse Case Manager Clinical Goal(s):  Marland Kitchen Over the next 30 days, patient will verbalize understanding of plan for referral to Cardiologist  . Over the next 90 days, patient will have no ED visits and or IP admissions related to Cardiopulmonary symptoms  CCM RN CM Interventions:  04/01/19 call completed with patient's mother [name]  . Evaluation of current treatment plan related to symptoms suggestive of Angina and patient's adherence to plan as established by provider . Reviewed medications with patient's mother and discussed patient is not currently prescribed to take any Cardiac or antihypertensive medications and or NTG; mother reports patient is adherent to taking her Omeprazole exactly as prescribed for GERD w/o missed doses . Instructed patient's mother to call 911 for symptoms suggestive of MI including, Pressure, tightness, pain, or a squeezing or aching sensation in your chest or arms that may spread to your neck, jaw or back; Nausea, indigestion, heartburn or abdominal pain; Shortness of breath; Cold sweat; Fatigue; Lightheadedness or sudden dizziness - mother verbalizes understanding . Collaborated with Dr. Baird Cancer regarding request for referral to Cardiology; she states she does not believe the patient's c/o chest pain are related to her GERD diagnosis  . Discussed plans with patient for ongoing care management follow up and provided patient with direct contact information for care management team  Patient Self Care Activities:  . Currently UNABLE TO independently perform ADL's  Initial goal documentation     . "We need someone to look at the bathroom"       Mother stated  Current Barriers:  . Home Safety Concerns . Impaired Physical Mobility . High Risk for Falls  Nurse Case  Manager Clinical Goal(s):  Marland Kitchen Over the next 30 days, patient will verbalize understanding of plan for HSE . Over the next 60 days, patient will have all DME needs met and all home safety concerns satisfied. . Over the next 90 days, patient will have no ED events and or IP admissions secondary to falls or injury from falls.   CCM RN CM Interventions:  04/01/19 call completed with patient  . Evaluation of current treatment plan related to impaired physical mobility and patient's adherence to plan as established by provider. . Provided education to patient re: availability of a HSE; discussed the benefits and purpose of having this evaluation; discussed Ms. Prestridge has no preference to which HHA is used; assessed for other DME needs; Assessed for fall risk and or prior history of falls  . Collaborated with Dr. Baird Cancer  regarding request for a HSE . Discussed plans with patient for ongoing care management follow up and provided patient with direct contact information for care management team  Patient Self Care Activities:  . Currently UNABLE TO independently perform ADL's  Initial goal documentation     . COMPLETED: Assist with Chronic Care Management and Care Coordination needs       Current Barriers:  Marland Kitchen Knowledge Barriers related to resources and support available to address needs related to Chronic Care Management and Care Coordination needs  Case Manager Clinical Goal(s):  Marland Kitchen Over the next 30 days, patient will work with the CCM team to address needs related to chronic disease management and care coordination needs  CCM RN CM Interventions:  03/29/19 call completed with patient's mother  [name] Estill Bakes     Assessed for CCM RN CM needs, established care plan/goals with patient's mother who is her caregiver  Patient Self Care Activities:  . Attends all scheduled provider appointments  Please see past updates related to this goal by clicking on the "Past Updates" button in the  selected goal         The patient verbalized understanding of instructions provided today and declined a print copy of patient instruction materials.   Telephone follow up appointment with care management team member scheduled for: 05/02/19  Barb Merino, RN, BSN, CCM Care Management Coordinator [place] [facility]  Direct Phone: [phone]

## 2019-04-01 NOTE — Chronic Care Management (AMB) (Signed)
Chronic Care Management   Initial Visit Note  03/29/2019 Name: Mary Neal MRN: [id number] DOB: [birth year]  Referred by: Glendale Chard, MD Reason for referral : Chronic Care Management (INITIAL CCM RNCM Telephone Folow up )   Mary Neal is a 53 y.o. year old female who is a primary care patient of Glendale Chard, MD. The CCM team was consulted for assistance with chronic disease management and care coordination needs.   Review of patient status, including review of consultants reports, relevant laboratory and other test results, and collaboration with appropriate care team members and the patient's provider was performed as part of comprehensive patient evaluation and provision of chronic care management services.    I spoke with Ms. Profeta mother Mary by telephone to assess for CCM RN CM needs and a care plan has been established.    Medications: Outpatient Encounter Medications as of 03/29/2019  Medication Sig  . Cholecalciferol 1000 UNITS capsule Take 500 Units by mouth daily.  Marland Kitchen linaclotide (LINZESS) 72 MCG capsule Take 1 capsule (72 mcg total) by mouth daily before breakfast.  . Multiple Vitamin (MULTI-VITAMINS) TABS Take by mouth.  Marland Kitchen omeprazole (PRILOSEC) 40 MG capsule TAKE 1 CAPSULE BY MOUTH EVERY DAY  . oxybutynin (DITROPAN XL) 15 MG 24 hr tablet Take 15 mg by mouth 2 (two) times daily.   . polyethylene glycol (MIRALAX) packet Take 17 g by mouth daily as needed for moderate constipation or severe constipation.  . SENNA CO by Combination route.  . vitamin C (ASCORBIC ACID) 500 MG tablet Take by mouth.   No facility-administered encounter medications on file as of 03/29/2019.      Objective:  BP Readings from Last 3 Encounters:  02/28/19 134/76  06/19/18 116/66  05/09/16 127/78   No results found for: HGBA1C Lab Results  Component Value Date   LDLCALC 89 06/19/2018   CREATININE 0.68 02/28/2019    Goals Addressed    . "We need a Cardiology  referral"       Mother stated Current Barriers:  Marland Kitchen Knowledge Deficits related to diagnosis and treatment for intermittent chest pain   Nurse Case Manager Clinical Goal(s):  Marland Kitchen Over the next 30 days, patient will verbalize understanding of plan for referral to Cardiologist  . Over the next 90 days, patient will have no ED visits and or IP admissions related to Cardiopulmonary symptoms  CCM RN CM Interventions:  04/01/19 call completed with patient's mother Mary  . Evaluation of current treatment plan related to symptoms suggestive of Angina and patient's adherence to plan as established by provider . Reviewed medications with patient's mother and discussed patient is not currently prescribed to take any Cardiac or antihypertensive medications and or NTG; mother reports patient is adherent to taking her Omeprazole exactly as prescribed for GERD w/o missed doses . Instructed patient's mother to call 911 for symptoms suggestive of MI including, Pressure, tightness, pain, or a squeezing or aching sensation in your chest or arms that may spread to your neck, jaw or back; Nausea, indigestion, heartburn or abdominal pain; Shortness of breath; Cold sweat; Fatigue; Lightheadedness or sudden dizziness - mother verbalizes understanding . Collaborated with Dr. Baird Cancer regarding request for referral to Cardiology; she states she does not believe the patient's c/o chest pain are related to her GERD diagnosis  . Discussed plans with patient for ongoing care management follow up and provided patient with direct contact information for care management team  Patient Self Care Activities:  . Currently UNABLE TO  independently perform ADL's  Initial goal documentation     . "We need someone to look at the bathroom"       Mother stated  Current Barriers:  . Home Safety Concerns . Impaired Physical Mobility . High Risk for Falls  Nurse Case Manager Clinical Goal(s):  Marland Kitchen Over the next 30 days, patient will  verbalize understanding of plan for HSE . Over the next 60 days, patient will have all DME needs met and all home safety concerns satisfied. . Over the next 90 days, patient will have no ED events and or IP admissions secondary to falls or injury from falls.   CCM RN CM Interventions:  04/01/19 call completed with patient  . Evaluation of current treatment plan related to impaired physical mobility and patient's adherence to plan as established by provider. . Provided education to patient re: availability of a HSE; discussed the benefits and purpose of having this evaluation; discussed Ms. Ng has no preference to which HHA is used; assessed for other DME needs; Assessed for fall risk and or prior history of falls  . Collaborated with Dr. Baird Cancer  regarding request for a HSE . Discussed plans with patient for ongoing care management follow up and provided patient with direct contact information for care management team  Patient Self Care Activities:  . Currently UNABLE TO independently perform ADL's  Initial goal documentation     . COMPLETED: Assist with Chronic Care Management and Care Coordination needs       Current Barriers:  Marland Kitchen Knowledge Barriers related to resources and support available to address needs related to Chronic Care Management and Care Coordination needs  Case Manager Clinical Goal(s):  Marland Kitchen Over the next 30 days, patient will work with the CCM team to address needs related to chronic disease management and care coordination needs  CCM RN CM Interventions:  03/29/19 call completed with patient's [name]     Assessed for CCM RN CM needs, established care plan/goals with patient's mother who is her caregiver  Patient Self Care Activities:  . Attends all scheduled provider appointments  Please see past updates related to this goal by clicking on the "Past Updates" button in the selected goal         Plan:   Telephone follow up appointment  with care management team member scheduled for: 05/02/19  Barb Merino, RN, BSN, CCM Care Management Coordinator [facility]  Direct Phone: [phone]

## 2019-04-03 ENCOUNTER — Other Ambulatory Visit: Payer: Self-pay | Admitting: Internal Medicine

## 2019-04-03 DIAGNOSIS — R0789 Other chest pain: Secondary | ICD-10-CM

## 2019-04-05 ENCOUNTER — Ambulatory Visit: Payer: Medicare Other

## 2019-04-05 DIAGNOSIS — Q059 Spina bifida, unspecified: Secondary | ICD-10-CM

## 2019-04-05 NOTE — Patient Instructions (Signed)
Licensed Clinical Social Worker Visit Information  Goals we discussed today:  Goals Addressed            This Visit's Progress   . "she needs someone to help with bathing"   On track    Mother stated:  Current Barriers:  . Lacks knowledge of Medicaid PCS benefit . Clinical Diagnosis of Spina Bifida limiting ADL ability . Aging mother/caregiver whom reports difficulty caring for the patient  Clinical Social Work Clinical Goal(s):  Marland Kitchen Over the next 45 days, client will work with SW to address concerns related to ADL limitations  CCM SW Interventions: Completed 04/02/2019 with Deberah Pelton  . Outbound call placed to the patient and her caregiver Natashia Deininger to assess progression of patient goal . Confirmed the patient has been in contact with chosen [facility] provider   o S[facility]L [place]    [phone]          [address] [place] [address] . Informed by Mrs. Arduini the [facility] provider may contact CCM SW regarding awarded hour amount "She needs more than 24 hours a month. We need more help than that" . Advised the patient's mother that CCM SW did not possess the ability to increase the patients awarded hours . Determined that due to the patients Spina Bifida she wear braces and utilizes crutches for ambulation . Educated Mrs. Schmuhl on the ability to appeal the determination from [facility] regarding PCS approved hours as well as the opportunity to request an increase in hours . Scheduled follow up call over the next two weeks to assess PCS provider schedule and determine if an increase in hours is needed  Patient Self Care Activities:  . Currently UNABLE TO independently perform ADL's  Please see past updates related to this goal by clicking on the "Past Updates" button in the selected goal          Follow Up Plan: SW will follow up with patient by phone over the next two weeks.  Daneen Schick, BSW, CDP Social Worker, Certified Dementia  Practitioner [place] [facility] [phone]

## 2019-04-05 NOTE — Chronic Care Management (AMB) (Signed)
Chronic Care Management   Social Work Follow Up Note  04/05/2019 Name: Mary Neal MRN: [id number] DOB: Feb 20, 1966  Mary Neal is a 53 y.o. year old female who is a primary care patient of Glendale Chard, MD. The CCM team was consulted for assistance with Caregiver Stress.   Review of patient status, including review of consultants reports, other relevant assessments, and collaboration with appropriate care team members and the patient's provider was performed as part of comprehensive patient evaluation and provision of chronic care management services.    CCM SW placed a follow up call to the patient and her [name] to determine status of PCS initiation. See care plan below.  Outpatient Encounter Medications as of 04/05/2019  Medication Sig  . Cholecalciferol 1000 UNITS capsule Take 500 Units by mouth daily.  Marland Kitchen linaclotide (LINZESS) 72 MCG capsule Take 1 capsule (72 mcg total) by mouth daily before breakfast.  . Multiple Vitamin (MULTI-VITAMINS) TABS Take by mouth.  Marland Kitchen omeprazole (PRILOSEC) 40 MG capsule TAKE 1 CAPSULE BY MOUTH EVERY DAY  . oxybutynin (DITROPAN XL) 15 MG 24 hr tablet Take 15 mg by mouth 2 (two) times daily.   . polyethylene glycol (MIRALAX) packet Take 17 g by mouth daily as needed for moderate constipation or severe constipation.  . SENNA CO by Combination route.  . vitamin C (ASCORBIC ACID) 500 MG tablet Take by mouth.   No facility-administered encounter medications on file as of 04/05/2019.      Goals Addressed            This Visit's Progress   . "she needs someone to help with bathing"   On track    Mother stated:  Current Barriers:  . Lacks knowledge of Medicaid PCS benefit . Clinical Diagnosis of Spina Bifida limiting ADL ability . Aging mother/caregiver whom reports difficulty caring for the patient  Clinical Social Work Clinical Goal(s):  Marland Kitchen Over the next 45 days, client will work with SW to address concerns related to  ADL limitations  CCM SW Interventions: Completed 04/02/2019 with Mary Neal  . Outbound call placed to the patient and her caregiver Mary Neal to assess progression of patient goal . Confirmed the patient has been in contact with chosen [facility] provider   o [facility] [place]    [phone]          [address] [place] [address] . Informed by Mary Neal the [facility] provider may contact CCM SW regarding awarded hour amount "She needs more than 24 hours a month. We need more help than that" . Advised the patient's mother that CCM SW did not possess the ability to increase the patients awarded hours . Determined that due to the patients Spina Bifida she wear braces and utilizes crutches for ambulation . Educated Mary Neal on the ability to appeal the determination from [facility] regarding PCS approved hours as well as the opportunity to request an increase in hours . Scheduled follow up call over the next two weeks to assess PCS provider schedule and determine if an increase in hours is needed  Patient Self Care Activities:  . Currently UNABLE TO independently perform ADL's  Please see past updates related to this goal by clicking on the "Past Updates" button in the selected goal          Follow Up Plan: SW will follow up with patient by phone over the next two weeks.   Daneen Schick, BSW, CDP Social  Worker, Certified Dementia Practitioner [facility] [phone]  Total time spent performing care coordination and/or care management activities with the patient by phone or face to face = 8 minutes.      

## 2019-04-15 ENCOUNTER — Other Ambulatory Visit: Payer: Self-pay

## 2019-04-15 ENCOUNTER — Encounter: Payer: Self-pay | Admitting: Internal Medicine

## 2019-04-15 ENCOUNTER — Ambulatory Visit (INDEPENDENT_AMBULATORY_CARE_PROVIDER_SITE_OTHER): Payer: Medicare Other | Admitting: Internal Medicine

## 2019-04-15 VITALS — BP 130/68 | HR 60 | Temp 98.1°F | Ht <= 58 in | Wt 111.0 lb

## 2019-04-15 DIAGNOSIS — K219 Gastro-esophageal reflux disease without esophagitis: Secondary | ICD-10-CM

## 2019-04-15 DIAGNOSIS — K5901 Slow transit constipation: Secondary | ICD-10-CM

## 2019-04-15 DIAGNOSIS — R0789 Other chest pain: Secondary | ICD-10-CM

## 2019-04-15 DIAGNOSIS — Z23 Encounter for immunization: Secondary | ICD-10-CM

## 2019-04-15 MED ORDER — TETANUS-DIPHTH-ACELL PERTUSSIS 5-2.5-18.5 LF-MCG/0.5 IM SUSP: 0.5000 mL | Freq: Once | INTRAMUSCULAR | 0 refills | Status: AC

## 2019-04-15 MED ORDER — FAMOTIDINE 20 MG PO TABS: 20.0000 mg | ORAL_TABLET | Freq: Every day | ORAL | 1 refills | Status: DC

## 2019-04-15 NOTE — Patient Instructions (Addendum)
Take famotidine before dinner  Please Call Dr. Baird Cancer in two weeks to let her know how you are doing [phone] x [phone]   Food Choices for Gastroesophageal Reflux Disease, Adult When you have gastroesophageal reflux disease (GERD), the foods you eat and your eating habits are very important. Choosing the right foods can help ease your discomfort. Think about working with a nutrition specialist (dietitian) to help you make good choices. What are tips for following this plan?  Meals  Choose healthy foods that are low in fat, such as fruits, vegetables, whole grains, low-fat dairy products, and lean meat, fish, and poultry.  Eat small meals often instead of 3 large meals a day. Eat your meals slowly, and in a place where you are relaxed. Avoid bending over or lying down until 2-3 hours after eating.  Avoid eating meals 2-3 hours before bed.  Avoid drinking a lot of liquid with meals.  Cook foods using methods other than frying. Bake, grill, or broil food instead.  Avoid or limit: ? Chocolate. ? Peppermint or spearmint. ? Alcohol. ? Pepper. ? Black and decaffeinated coffee. ? Black and decaffeinated tea. ? Bubbly (carbonated) soft drinks. ? Caffeinated energy drinks and soft drinks.  Limit high-fat foods such as: ? Fatty meat or fried foods. ? Whole milk, cream, butter, or ice cream. ? Nuts and nut butters. ? Pastries, donuts, and sweets made with butter or shortening.  Avoid foods that cause symptoms. These foods may be different for everyone. Common foods that cause symptoms include: ? Tomatoes. ? Oranges, lemons, and limes. ? Peppers. ? Spicy food. ? Onions and garlic. ? Vinegar. Lifestyle  Maintain a healthy weight. Ask your doctor what weight is healthy for you. If you need to lose weight, work with your doctor to do so safely.  Exercise for at least 30 minutes for 5 or more days each week, or as told by your doctor.  Wear loose-fitting clothes.  Do not smoke.  If you need help quitting, ask your doctor.  Sleep with the head of your bed higher than your feet. Use a wedge under the mattress or blocks under the bed frame to raise the head of the bed. Summary  When you have gastroesophageal reflux disease (GERD), food and lifestyle choices are very important in easing your symptoms.  Eat small meals often instead of 3 large meals a day. Eat your meals slowly, and in a place where you are relaxed.  Limit high-fat foods such as fatty meat or fried foods.  Avoid bending over or lying down until 2-3 hours after eating.  Avoid peppermint and spearmint, caffeine, alcohol, and chocolate. This information is not intended to replace advice given to you by your health care provider. Make sure you discuss any questions you have with your health care provider. Document Released: 12/27/2011 Document Revised: 10/18/2018 Document Reviewed: 08/02/2016 Elsevier Patient Education  [place].    Exercises To Do While Sitting  Exercises that you do while sitting (chair exercises) can give you many of the same benefits as full exercise. Benefits include strengthening your heart, burning calories, and keeping muscles and joints healthy. Exercise can also improve your mood and help with depression and anxiety. You may benefit from chair exercises if you are unable to do standing exercises because of:  Diabetic foot pain.  Obesity.  Illness.  Arthritis.  Recovery from surgery or injury.  Breathing problems.  Balance problems.  Another type of disability. Before starting chair exercises, check with  your health care provider or a physical therapist to find out how much exercise you can tolerate and which exercises are safe for you. If your health care provider approves:  Start out slowly and build up over time. Aim to work up to about 10-20 minutes for each exercise session.  Make exercise part of your daily routine.  Drink water when you  exercise. Do not wait until you are thirsty. Drink every 10-15 minutes.  Stop exercising right away if you have pain, nausea, shortness of breath, or dizziness.  If you are exercising in a wheelchair, make sure to lock the wheels.  Ask your health care provider whether you can do tai chi or yoga. Many positions in these mind-body exercises can be modified to do while seated. Warm-up Before starting other exercises: 1. Sit up as straight as you can. Have your knees bent at 90 degrees, which is the shape of the capital letter "L." Keep your feet flat on the floor. 2. Sit at the front edge of your chair, if you can. 3. Pull in (tighten) the muscles in your abdomen and stretch your spine and neck as straight as you can. Hold this position for a few minutes. 4. Breathe in and out evenly. Try to concentrate on your breathing, and relax your mind. Stretching Exercise A: Arm stretch 1. Hold your arms out straight in front of your body. 2. Bend your hands at the wrist with your fingers pointing up, as if signaling someone to stop. Notice the slight tension in your forearms as you hold the position. 3. Keeping your arms out and your hands bent, rotate your hands outward as far as you can and hold this stretch. Aim to have your thumbs pointing up and your pinkie fingers pointing down. Slowly repeat arm stretches for one minute as tolerated. Exercise B: Leg stretch 1. If you can move your legs, try to "draw" letters on the floor with the toes of your foot. Write your name with one foot. 2. Write your name with the toes of your other foot. Slowly repeat the movements for one minute as tolerated. Exercise C: Reach for the sky 1. Reach your hands as far over your head as you can to stretch your spine. 2. Move your hands and arms as if you are climbing a rope. Slowly repeat the movements for one minute as tolerated. Range of motion exercises Exercise A: Shoulder roll 1. Let your arms hang loosely at  your sides. 2. Lift just your shoulders up toward your ears, then let them relax back down. 3. When your shoulders feel loose, rotate your shoulders in backward and forward circles. Do shoulder rolls slowly for one minute as tolerated. Exercise B: March in place 1. As if you are marching, pump your arms and lift your legs up and down. Lift your knees as high as you can. ? If you are unable to lift your knees, just pump your arms and move your ankles and feet up and down. March in place for one minute as tolerated. Exercise C: Seated jumping jacks 1. Let your arms hang down straight. 2. Keeping your arms straight, lift them up over your head. Aim to point your fingers to the ceiling. 3. While you lift your arms, straighten your legs and slide your heels along the floor to your sides, as wide as you can. 4. As you bring your arms back down to your sides, slide your legs back together. ? If you are unable to  use your legs, just move your arms. Slowly repeat seated jumping jacks for one minute as tolerated. Strengthening exercises Exercise A: Shoulder squeeze 1. Hold your arms straight out from your body to your sides, with your elbows bent and your fists pointed at the ceiling. 2. Keeping your arms in the bent position, move them forward so your elbows and forearms meet in front of your face. 3. Open your arms back out as wide as you can with your elbows still bent, until you feel your shoulder blades squeezing together. Hold for 5 seconds. Slowly repeat the movements forward and backward for one minute as tolerated. Contact a health care provider if you:  Had to stop exercising due to any of the following: ? Pain. ? Nausea. ? Shortness of breath. ? Dizziness. ? Fatigue.  Have significant pain or soreness after exercising. Get help right away if you have:  Chest pain.  Difficulty breathing. These symptoms may represent a serious problem that is an emergency. Do not wait to see if the  symptoms will go away. Get medical help right away. Call your local emergency services (911 in the [place] Do not drive yourself to the hospital. This information is not intended to replace advice given to you by your health care provider. Make sure you discuss any questions you have with your health care provider. Document Released: 05/10/2017 Document Revised: 10/18/2018 Document Reviewed: 05/10/2017 Elsevier Patient Education  2020 [employer].

## 2019-04-16 ENCOUNTER — Ambulatory Visit: Payer: Medicare Other

## 2019-04-16 DIAGNOSIS — Q6589 Other specified congenital deformities of hip: Secondary | ICD-10-CM

## 2019-04-16 DIAGNOSIS — Q059 Spina bifida, unspecified: Secondary | ICD-10-CM

## 2019-04-16 NOTE — Patient Instructions (Signed)
Social Worker Visit Information  Goals we discussed today:  Goals Addressed            This Visit's Progress   . "she needs someone to help with bathing"   On track    Mother stated:  Current Barriers:  . Lacks knowledge of Medicaid PCS benefit . Clinical Diagnosis of Spina Bifida limiting ADL ability . Aging mother/caregiver whom reports difficulty caring for the patient  Clinical Social Work Clinical Goal(s):  Marland Kitchen Over the next 45 days, client will work with SW to address concerns related to ADL limitations  CCM SW Interventions: Completed 04/16/2019 with Deberah Pelton  . Outbound call placed to the patient and her caregiver Oluwadara Gorman to assess progression of patient goal . Confirmed the patient has completed in home assessment with chosen [facility] provider   o [facility] [place]    [address]  [address] . Informed by Mrs. Geron the patient will receive PCS services Monday, Wednesday, and Friday of each week . Determined the start of care date has yet to be arranged due to S&L awaiting approval of patients plan of care from [facility] . Encouraged Mrs. Partridge to contact SW if services have not begun by end of week . Scheduled follow up call to the patient over the next two weeks to follow up on progression of goal  Patient Self Care Activities:  . Currently UNABLE TO independently perform ADL's  Please see past updates related to this goal by clicking on the "Past Updates" button in the selected goal      . COMPLETED: "We need someone to look at the bathroom"       Mother stated  Current Barriers:  . Home Safety Concerns . Impaired Physical Mobility . High Risk for Falls  Nurse Case Manager Clinical Goal(s):  Marland Kitchen Over the next 30 days, patient will verbalize understanding of plan for HSE . Over the next 60 days, patient will have all DME needs met and all home safety concerns satisfied. . Over the next 90  days, patient will have no ED events and or IP admissions secondary to falls or injury from falls.   CCM SW Interventions Completed 10/6 2020 with Deberah Pelton . Outbound call to review progress of patent stated goal . Determined the patient is no longer interested in completing a HSE due to recent home visit from [facility] RN to perform PCS assessment "she showed me how to raise the bedside commode we have to fit over the toilet and I think that will work good for [place]" . Encouraged the patient and/or her mother to contact CCM team if assistance with a HSE is desired in the future . Collaborated with RN Case Manager regarding patient desire to close goal at this time   Patient Self Care Activities:  . Currently UNABLE TO independently perform ADL's  Please see past updates related to this goal by clicking on the "Past Updates" button in the selected goal          Follow Up Plan: SW will follow up with patient by phone over the next week.   Daneen Schick, BSW, CDP Social Worker, Certified Dementia Practitioner [place] [facility] [phone]

## 2019-04-16 NOTE — Chronic Care Management (AMB) (Signed)
Chronic Care Management   Social Work Follow Up Note  04/16/2019 Name: JERRY HAUGEN MRN: [id number] DOB: 1966-02-04  [name] is a 53 y.o. year old female who is a primary care patient of Glendale Chard, MD. The CCM team was consulted for assistance with [employer] .   Review of patient status, including review of consultants reports, other relevant assessments, and collaboration with appropriate care team members and the patient's provider was performed as part of comprehensive patient evaluation and provision of chronic care management services.    I placed an outbound call to the patient on today's date to assess progression of patient goals. See care plan below for updated interventions.  Outpatient Encounter Medications as of 04/16/2019  Medication Sig  . Cholecalciferol 1000 UNITS capsule Take 500 Units by mouth daily.  . famotidine (PEPCID) 20 MG tablet Take 1 tablet (20 mg total) by mouth daily.  Marland Kitchen linaclotide (LINZESS) 72 MCG capsule Take 1 capsule (72 mcg total) by mouth daily before breakfast. (Patient not taking: Reported on 04/15/2019)  . Multiple Vitamin (MULTI-VITAMINS) TABS Take by mouth.  Marland Kitchen omeprazole (PRILOSEC) 40 MG capsule TAKE 1 CAPSULE BY MOUTH EVERY DAY  . oxybutynin (DITROPAN XL) 15 MG 24 hr tablet Take 15 mg by mouth 2 (two) times daily.   . polyethylene glycol (MIRALAX) packet Take 17 g by mouth daily as needed for moderate constipation or severe constipation.  . SENNA CO by Combination route.  . vitamin C (ASCORBIC ACID) 500 MG tablet Take by mouth.   No facility-administered encounter medications on file as of 04/16/2019.      Goals Addressed            This Visit's Progress   . "she needs someone to help with bathing"   On track    Mother stated:  Current Barriers:  . Lacks knowledge of Medicaid PCS benefit . Clinical Diagnosis of Spina Bifida limiting ADL ability . Aging mother/caregiver whom reports difficulty caring for the  patient  Clinical Social Work Clinical Goal(s):  Marland Kitchen Over the next 45 days, client will work with SW to address concerns related to ADL limitations  CCM SW Interventions: Completed 04/16/2019 with Deberah Pelton  . Outbound call placed to the patient and her caregiver Alonie Gazzola to assess progression of patient goal . Confirmed the patient has completed in home assessment with chosen [facility] provider   [address]  [id number] . Informed by Mrs. Durell the patient will receive PCS services Monday, Wednesday, and Friday of each week . Determined the start of care date has yet to be arranged due to S&[facility] awaiting approval of patients plan of care from [facility] . Encouraged Mrs. Haley to contact SW if services have not begun by end of week . Scheduled follow up call to the patient over the next two weeks to follow up on progression of goal  Patient Self Care Activities:  . Currently UNABLE TO independently perform ADL's  Please see past updates related to this goal by clicking on the "Past Updates" button in the selected goal      . COMPLETED: "We need someone to look at the bathroom"       Mother stated  Current Barriers:  . Home Safety Concerns . Impaired Physical Mobility . High Risk for Falls  Nurse Case Manager Clinical Goal(s):  Marland Kitchen Over  the next 30 days, patient will verbalize understanding of plan for HSE . Over the next 60 days, patient will have all DME needs met and all home safety concerns satisfied. . Over the next 90 days, patient will have no ED events and or IP admissions secondary to falls or injury from falls.   CCM SW Interventions Completed 10/6 2020 with Deberah Pelton . Outbound call to review progress of patent stated goal . Determined the patient is no longer interested in completing a HSE due to recent home visit from [facility] RN to perform PCS assessment "she showed me how to raise  the bedside commode we have to fit over the toilet and I think that will work good for [place]" . Encouraged the patient and/or her mother to contact CCM team if assistance with a HSE is desired in the future . Collaborated with RN Case Manager regarding patient desire to close goal at this time  Patient Self Care Activities:  . Currently UNABLE TO independently perform ADL's  Please see past updates related to this goal by clicking on the "Past Updates" button in the selected goal          Follow Up Plan: SW will follow up with patient by phone over the next week.   Daneen Schick, BSW, CDP Social Worker, Certified Dementia Practitioner [facility] [phone]  Total time spent performing care coordination and/or care management activities with the patient by phone or face to face = 12 minutes.

## 2019-04-17 ENCOUNTER — Encounter: Payer: Self-pay | Admitting: Cardiology

## 2019-04-17 ENCOUNTER — Ambulatory Visit (INDEPENDENT_AMBULATORY_CARE_PROVIDER_SITE_OTHER): Payer: Medicare Other | Admitting: Cardiology

## 2019-04-17 ENCOUNTER — Other Ambulatory Visit: Payer: Self-pay

## 2019-04-17 VITALS — BP 142/73 | HR 74 | Temp 97.5°F | Ht <= 58 in | Wt 106.0 lb

## 2019-04-17 DIAGNOSIS — Q059 Spina bifida, unspecified: Secondary | ICD-10-CM | POA: Diagnosis not present

## 2019-04-17 DIAGNOSIS — R0789 Other chest pain: Secondary | ICD-10-CM | POA: Insufficient documentation

## 2019-04-17 NOTE — Progress Notes (Signed)
Patient referred by Glendale Chard, MD for atypical chest pain.  Subjective:   Mary Neal, female    DOB: 1965/08/08, 53 y.o.   MRN: [id number]   Chief Complaint  Patient presents with  . Chest Pain     HPI  53 y/o [place] American female with GERD, Spina bifida with paraparesis, referred for evaluation of atypical chest pain.   Patient is here with her mother today.  She has had episodes of retrosternal and left-sided chest pain episodes for close to 3 months now.  Episode started around the time she had hip replacement surgery.  Pain is described as sharp, tight chest her underwear, typically starts while at rest.  Episodes last up to 30-minute.  During these episodes, she is unable to take deep breaths due to pain.  Pain did not seem to be related to physical activity.  She ambulates using crutches or using wheelchair due to her paraparesis related to spina bifida.  She has a lot of issues with constipation and diarrhea which she partially attributes to her spina bifida.  Her PCP Dr. Baird Cancer has been working on this.  She was recently started on omeprazole, after which symptoms have improved but have not disappeared.  She does not have any other cardiac complaints.    Past Medical History:  Diagnosis Date  . Abnormal Pap smear 2013  . Dislocation of right hip ([place]) 06/09/2018   Per MRI  . Spina bifida ([place])   . Uterine prolapse   . Wears glasses      Past Surgical History:  Procedure Laterality Date  . TOTAL HIP ARTHROPLASTY Right 12/2018     Social History   Socioeconomic History  . Marital status: Single    Spouse name: Not on file  . Number of children: Not on file  . Years of education: Not on file  . Highest education level: Not on file  Occupational History  . Not on file  Social Needs  . Financial resource strain: Not on file  . Food insecurity    Worry: Not on file    Inability: Not on file  . Transportation needs    Medical: Not on file    Non-medical: Not on file  Tobacco Use  . Smoking status: Never Smoker  . Smokeless tobacco: Never Used  Substance and Sexual Activity  . Alcohol use: No    Alcohol/week: 0.0 standard drinks  . Drug use: No  . Sexual activity: Never  Lifestyle  . Physical activity    Days per week: Not on file    Minutes per session: Not on file  . Stress: Not on file  Relationships  . Social Herbalist on phone: Not on file    Gets together: Not on file    Attends religious service: Not on file    Active member of club or organization: Not on file    Attends meetings of clubs or organizations: Not on file    Relationship status: Not on file  . Intimate partner violence    Fear of current or ex partner: Not on file    Emotionally abused: Not on file    Physically abused: Not on file    Forced sexual activity: Not on file  Other Topics Concern  . Not on file  Social History Narrative   ** Merged History Encounter **         Family History  Problem Relation Age of Onset  .  Hypertension Mother      Current Outpatient Medications on File Prior to Visit  Medication Sig Dispense Refill  . Cholecalciferol 1000 UNITS capsule Take 500 Units by mouth daily.    . famotidine (PEPCID) 20 MG tablet Take 1 tablet (20 mg total) by mouth daily. 30 tablet 1  . linaclotide (LINZESS) 72 MCG capsule Take 1 capsule (72 mcg total) by mouth daily before breakfast. (Patient not taking: Reported on 04/15/2019) 30 capsule 1  . Multiple Vitamin (MULTI-VITAMINS) TABS Take by mouth.    Marland Kitchen omeprazole (PRILOSEC) 40 MG capsule TAKE 1 CAPSULE BY MOUTH EVERY DAY 30 capsule 1  . oxybutynin (DITROPAN XL) 15 MG 24 hr tablet Take 15 mg by mouth 2 (two) times daily.     . polyethylene glycol (MIRALAX) packet Take 17 g by mouth daily as needed for moderate constipation or severe constipation. 14 each 0  . SENNA CO by Combination route.    . vitamin C (ASCORBIC ACID) 500 MG tablet Take by mouth.     No current  facility-administered medications on file prior to visit.     Cardiovascular studies:  EKG 04/17/2019: Sinus rhythm 73 bpm.  Left atrial enlargement.  Negative precordial T-waves  -Probably normal -consider anteroseptal ischemia.  No change compared to previous EKG's dating back to 2016.    Recent labs:  Results for CAMDYNN, SNIPES (MRN [id number]) as of 04/17/2019 05:25  Ref. Range 02/28/2019 12:14  WBC Latest Ref Range: 3.4 - 10.8 x10E3/uL 4.7  RBC Latest Ref Range: 3.77 - 5.28 x10E6/uL 4.23  Hemoglobin Latest Ref Range: 11.1 - 15.9 g/dL 12.0  HCT Latest Ref Range: 34.0 - 46.6 % 36.1  MCV Latest Ref Range: 79 - 97 fL 85  MCH Latest Ref Range: 26.6 - 33.0 pg 28.4  MCHC Latest Ref Range: 31.5 - 35.7 g/dL 33.2  RDW Latest Ref Range: 11.7 - 15.4 % 12.4  Platelets Latest Ref Range: 150 - 450 x10E3/uL 201   Results for VIRDELL, BROCKSMITH (MRN [id number]) as of 04/17/2019 05:25  Ref. Range 02/28/2019 12:14  Sodium Latest Ref Range: 134 - 144 mmol/L 139  Potassium Latest Ref Range: 3.5 - 5.2 mmol/L 4.3  Chloride Latest Ref Range: 96 - 106 mmol/L 103  CO2 Latest Ref Range: 20 - 29 mmol/L 22  Glucose Latest Ref Range: 65 - 99 mg/dL 79  BUN Latest Ref Range: 6 - 24 mg/dL 13  Creatinine Latest Ref Range: 0.57 - 1.00 mg/dL 0.68  Calcium Latest Ref Range: 8.7 - 10.2 mg/dL 9.7  BUN/Creatinine Ratio Latest Ref Range: 9 - 23  19  Alkaline Phosphatase Latest Ref Range: 39 - 117 IU/L 108  Albumin Latest Ref Range: 3.8 - 4.9 g/dL 4.2  Albumin/Globulin Ratio Latest Ref Range: 1.2 - 2.2  1.7  AST Latest Ref Range: 0 - 40 IU/L 22  ALT Latest Ref Range: 0 - 32 IU/L 15  Total Protein Latest Ref Range: 6.0 - 8.5 g/dL 6.7  Total Bilirubin Latest Ref Range: 0.0 - 1.2 mg/dL <0.2  GFR, Est Non African American Latest Ref Range: >59 mL/min/1.73 99  GFR, Est African American Latest Ref Range: >59 mL/min/1.73 115   Results for LARSYN, LOSA (MRN [id number]) as of 04/17/2019 05:25  Ref. Range  02/28/2019 12:14  TSH Latest Ref Range: 0.450 - 4.500 uIU/mL 1.790    Results for TRYNITI, MOORER (MRN [id number]) as of 04/17/2019 05:25  Ref. Range 06/19/2018 11:18  Total CHOL/HDL Ratio Latest Ref Range: 0.0 -  4.4 ratio 2.1  Cholesterol, Total Latest Ref Range: 100 - 199 mg/dL 189  HDL Cholesterol Latest Ref Range: >39 mg/dL 92  LDL (calc) Latest Ref Range: 0 - 99 mg/dL 89  Triglycerides Latest Ref Range: 0 - 149 mg/dL 38  VLDL Cholesterol Cal Latest Ref Range: 5 - 40 mg/dL 8     Review of Systems  Constitution: Negative for decreased appetite, malaise/fatigue, weight gain and weight loss.  HENT: Negative for congestion.   Eyes: Negative for visual disturbance.  Cardiovascular: Positive for chest pain. Negative for dyspnea on exertion, leg swelling, palpitations and syncope.  Respiratory: Negative for cough.   Endocrine: Negative for cold intolerance.  Hematologic/Lymphatic: Does not bruise/bleed easily.  Skin: Negative for itching and rash.  Musculoskeletal: Negative for myalgias.       Spina bifida  Gastrointestinal: Negative for abdominal pain, nausea and vomiting.  Genitourinary: Negative for dysuria.  Neurological: Negative for dizziness and weakness.       Paraparesis of bilateral lower extremities  Psychiatric/Behavioral: The patient is not nervous/anxious.   All other systems reviewed and are negative.        Vitals:   04/17/19 0915  BP: (!) 142/73  Pulse: 74  Temp: (!) 97.5 F (36.4 C)  SpO2: 97%     Body mass index is 22.94 kg/m. Filed Weights   04/17/19 0915  Weight: 106 lb (48.1 kg)     Objective:   Physical Exam  Constitutional: She is oriented to person, place, and time. She appears well-developed and well-nourished. No distress.  Patient has corrective appliances in lower extremities  HENT:  Head: Normocephalic and atraumatic.  Eyes: Pupils are equal, round, and reactive to light. Conjunctivae are normal.  Neck: No JVD present.   Cardiovascular: Normal rate, regular rhythm and intact distal pulses.  No murmur heard. Pulmonary/Chest: Effort normal and breath sounds normal. She has no wheezes. She has no rales.  Abdominal: Soft. Bowel sounds are normal. There is no rebound.  Musculoskeletal:        General: No edema.     Comments: Kyphosis  Lymphadenopathy:    She has no cervical adenopathy.  Neurological: She is alert and oriented to person, place, and time. No cranial nerve deficit.  Lower extremity paraparesis  Skin: Skin is warm and dry.  Psychiatric: She has a normal mood and affect.  Nursing note and vitals reviewed.         Assessment & Recommendations:   53 y/o [place] American female with GERD, Spina bifida with paraparesis, referred for evaluation of atypical chest pain.   Chest pain: By description, does not appear to be angina.  Her EKG does show ST-T wave abnormalities in anteroseptal leads.  However, EKG is unchanged compared to previous EKGs dating back to 2016.  There is no evidence of old infarct.  I will obtain echocardiogram to rule out any structural abnormalities.  Low suspicion of CAD, thus ischemia testing not warranted at this time.  Her chest pain is most likely related to constipation and flatulence or GERD.  Small possibility of pericarditis.  Look for any pericardial effusion on echocardiogram.  Follow-up after echocardiogram.  Thank you for referring the patient to [place]. Please feel free to contact with any questions.  Nigel Mormon, MD [facility] [place] Pager: [phone] Office: [phone] If no answer Cell [phone]

## 2019-04-18 DIAGNOSIS — Z471 Aftercare following joint replacement surgery: Secondary | ICD-10-CM | POA: Diagnosis not present

## 2019-04-18 DIAGNOSIS — Z96641 Presence of right artificial hip joint: Secondary | ICD-10-CM | POA: Diagnosis not present

## 2019-04-18 DIAGNOSIS — Z96652 Presence of left artificial knee joint: Secondary | ICD-10-CM | POA: Diagnosis not present

## 2019-04-22 ENCOUNTER — Other Ambulatory Visit: Payer: Self-pay | Admitting: Internal Medicine

## 2019-04-24 DIAGNOSIS — D259 Leiomyoma of uterus, unspecified: Secondary | ICD-10-CM | POA: Diagnosis not present

## 2019-04-24 DIAGNOSIS — R9341 Abnormal radiologic findings on diagnostic imaging of renal pelvis, ureter, or bladder: Secondary | ICD-10-CM | POA: Diagnosis not present

## 2019-04-24 DIAGNOSIS — N319 Neuromuscular dysfunction of bladder, unspecified: Secondary | ICD-10-CM | POA: Diagnosis not present

## 2019-04-25 ENCOUNTER — Ambulatory Visit: Payer: Medicare Other

## 2019-04-25 DIAGNOSIS — Q059 Spina bifida, unspecified: Secondary | ICD-10-CM

## 2019-04-25 NOTE — Patient Instructions (Signed)
Social Worker Visit Information  Goals we discussed today:  Goals Addressed            This Visit's Progress   . COMPLETED: "she needs someone to help with bathing"       Mother stated:  Current Barriers:  . Lacks knowledge of Medicaid PCS benefit . Clinical Diagnosis of Spina Bifida limiting ADL ability . Aging mother/caregiver whom reports difficulty caring for the patient  Clinical Social Work Clinical Goal(s):  Marland Kitchen Over the next 45 days, client will work with SW to address concerns related to ADL limitations  CCM SW Interventions: Completed 04/25/2019 with Deberah Pelton  . Outbound call placed to the patient and spoke with Deberah Pelton to assess progression of patient goal . Determined the patient has yet to receive PCS services from [facility] due to having several appointments with physicians . Encouraged Mrs. Butchart to contact SW if future assistance is needed with PCS arrangements   Patient Self Care Activities:  . Currently UNABLE TO independently perform ADL's  Please see past updates related to this goal by clicking on the "Past Updates" button in the selected goal          Follow Up Plan: No SW follow up needed at this time. Patient is encouraged to contact SW with future resource needs.   Daneen Schick, BSW, CDP Social Worker, Certified Dementia Practitioner [place] [facility] [phone]

## 2019-04-25 NOTE — Chronic Care Management (AMB) (Signed)
  Chronic Care Management   Social Work Follow Up Note  04/25/2019 Name: Mary Neal MRN: [id number] DOB: 06/30/1966  Mary Neal is a 53 y.o. year old female who is a primary care patient of Mary Chard, MD. The CCM team was consulted for assistance with [employer] .   Review of patient status, including review of consultants reports, other relevant assessments, and collaboration with appropriate care team members and the patient's provider was performed as part of comprehensive patient evaluation and provision of chronic care management services.     Outpatient Encounter Medications as of 04/25/2019  Medication Sig  . Cholecalciferol 1000 UNITS capsule Take 500 Units by mouth daily.  . famotidine (PEPCID) 20 MG tablet Take 1 tablet (20 mg total) by mouth daily.  . Multiple Vitamin (MULTI-VITAMINS) TABS Take by mouth.  Marland Kitchen omeprazole (PRILOSEC) 40 MG capsule TAKE 1 CAPSULE BY MOUTH EVERY DAY  . oxybutynin (DITROPAN XL) 15 MG 24 hr tablet Take 15 mg by mouth 2 (two) times daily.   . polyethylene glycol (MIRALAX) packet Take 17 g by mouth daily as needed for moderate constipation or severe constipation.  . vitamin C (ASCORBIC ACID) 500 MG tablet Take by mouth.   No facility-administered encounter medications on file as of 04/25/2019.      Goals Addressed            This Visit's Progress   . COMPLETED: "she needs someone to help with bathing"       Mother stated:  Current Barriers:  . Lacks knowledge of Medicaid PCS benefit . Clinical Diagnosis of Spina Bifida limiting ADL ability . Aging mother/caregiver whom reports difficulty caring for the patient  Clinical Social Work Clinical Goal(s):  Marland Kitchen Over the next 45 days, client will work with SW to address concerns related to ADL limitations  CCM SW Interventions: Completed 04/25/2019 with Deberah Pelton  . Outbound call placed to the patient and spoke with Deberah Pelton to assess progression of patient  goal . Determined the patient has yet to receive PCS services from [facility] due to having several appointments with physicians . Encouraged [name] to contact SW if future assistance is needed with PCS arrangements   Patient Self Care Activities:  . Currently UNABLE TO independently perform ADL's  Please see past updates related to this goal by clicking on the "Past Updates" button in the selected goal          Follow Up Plan: No further SW follow up planned at this time. The patient will continue to be engaged by RN Case Freight forwarder. Patient is encouraged to contact SW for future resource needs.   Daneen Schick, BSW, CDP Social Worker, Certified Dementia Practitioner [place] [facility] [phone]  Total time spent performing care coordination and/or care management activities with the patient by phone or face to face = 9 minutes.

## 2019-04-30 ENCOUNTER — Other Ambulatory Visit: Payer: Medicare Other

## 2019-05-01 ENCOUNTER — Ambulatory Visit (INDEPENDENT_AMBULATORY_CARE_PROVIDER_SITE_OTHER): Payer: Medicare Other

## 2019-05-01 ENCOUNTER — Other Ambulatory Visit: Payer: Self-pay

## 2019-05-01 DIAGNOSIS — R0789 Other chest pain: Secondary | ICD-10-CM

## 2019-05-02 ENCOUNTER — Ambulatory Visit (INDEPENDENT_AMBULATORY_CARE_PROVIDER_SITE_OTHER): Payer: Medicare Other

## 2019-05-02 ENCOUNTER — Other Ambulatory Visit: Payer: Self-pay

## 2019-05-02 ENCOUNTER — Telehealth: Payer: Medicare Other

## 2019-05-02 DIAGNOSIS — Q6589 Other specified congenital deformities of hip: Secondary | ICD-10-CM

## 2019-05-02 DIAGNOSIS — K219 Gastro-esophageal reflux disease without esophagitis: Secondary | ICD-10-CM

## 2019-05-02 DIAGNOSIS — M1712 Unilateral primary osteoarthritis, left knee: Secondary | ICD-10-CM | POA: Diagnosis not present

## 2019-05-02 DIAGNOSIS — Q059 Spina bifida, unspecified: Secondary | ICD-10-CM

## 2019-05-04 NOTE — Progress Notes (Signed)
Subjective:     Patient ID: Mary Neal , female    DOB: Nov 15, 1965 , 53 y.o.   MRN: [id number]   Chief Complaint  Patient presents with  . Gastroesophageal Reflux    HPI  She is here today for f/u reflux. She reports her sx are somewhat better. Her Mother is accompanying her today, and adds that the patient often has belching. She is also still complaining of intermittent chest pain. Her Mother is anxiously awaiting Cardiology referral.   Gastroesophageal Reflux She complains of belching and chest pain. This is a recurrent problem. The current episode started more than 1 month ago. The symptoms are aggravated by certain foods. Pertinent negatives include no melena or muscle weakness. Risk factors include lack of exercise. She has tried a PPI and a histamine-2 antagonist for the symptoms.     Past Medical History:  Diagnosis Date  . Abnormal Pap smear 2013  . Dislocation of right hip ([place]) 06/09/2018   Per MRI  . History of knee replacement 2005  . Spina bifida ([place])   . Uterine prolapse   . Wears glasses      Family History  Problem Relation Age of Onset  . Hypertension Mother   . Hyperlipidemia Mother   . Heart attack Brother      Current Outpatient Medications:  .  Cholecalciferol 1000 UNITS capsule, Take 500 Units by mouth daily., Disp: , Rfl:  .  Multiple Vitamin (MULTI-VITAMINS) TABS, Take by mouth., Disp: , Rfl:  .  oxybutynin (DITROPAN XL) 15 MG 24 hr tablet, Take 15 mg by mouth 2 (two) times daily. , Disp: , Rfl:  .  polyethylene glycol (MIRALAX) packet, Take 17 g by mouth daily as needed for moderate constipation or severe constipation., Disp: 14 each, Rfl: 0 .  vitamin C (ASCORBIC ACID) 500 MG tablet, Take by mouth., Disp: , Rfl:  .  famotidine (PEPCID) 20 MG tablet, Take 1 tablet (20 mg total) by mouth daily., Disp: 30 tablet, Rfl: 1 .  omeprazole (PRILOSEC) 40 MG capsule, TAKE 1 CAPSULE BY MOUTH EVERY DAY, Disp: 90 capsule, Rfl: 1   Allergies   Allergen Reactions  . Avocado Other (See Comments)  . Banana Other (See Comments)  . Latex Other (See Comments)  . Penicillins Other (See Comments)     Review of Systems  Constitutional: Negative.   Respiratory: Negative.   Cardiovascular: Positive for chest pain.  Gastrointestinal: Negative.  Negative for melena.  Musculoskeletal: Negative for muscle weakness.  Neurological: Negative.   Psychiatric/Behavioral: Negative.      Today's Vitals   04/15/19 1059  BP: 130/68  Pulse: 60  Temp: 98.1 F (36.7 C)  TempSrc: Oral  SpO2: 97%  Weight: 111 lb (50.3 kg)  Height: 4\' 9"  (1.448 m)   Body mass index is 24.02 kg/m.   Objective:  Physical Exam Vitals signs and nursing note reviewed.  Constitutional:      Appearance: Normal appearance.  HENT:     Head: Normocephalic and atraumatic.  Cardiovascular:     Rate and Rhythm: Normal rate and regular rhythm.     Heart sounds: Normal heart sounds.  Pulmonary:     Effort: Pulmonary effort is normal.     Breath sounds: Normal breath sounds.  Skin:    General: Skin is warm.  Neurological:     General: No focal deficit present.     Mental Status: She is alert.  Psychiatric:        Mood and  Affect: Mood normal.        Behavior: Behavior normal.         Assessment And Plan:     1. Gastroesophageal reflux disease without esophagitis  Chronic. She will continue with omeprazole every am and I will add pepcid 20mg  to her evening meal. She will rto in six weeks for re-evaluation. She is reminded to stop eating 3 hours prior to going to bed/lying down.   2. Atypical chest pain  I think her sx are due to reflux. However, she has an upcoming Cardiology appt.   3. Slow transit constipation  Chronic.   4. Flu vaccine need  She was given flu vaccine during her visit. A Rx for Tdap was also sent to her pharmacy.   Maximino Greenland, MD    THE PATIENT IS ENCOURAGED TO PRACTICE SOCIAL DISTANCING DUE TO THE COVID-19 PANDEMIC.

## 2019-05-05 NOTE — Progress Notes (Signed)
Patient referred by Glendale Chard, MD for atypical chest pain.  Subjective:   Mary Neal, female    DOB: [birth year], 53 y.o.   MRN: [id number]  I connected with the patient on 05/08/2019 by a video enabled telemedicine application and verified that I am speaking with the correct person using two identifiers.     I discussed the limitations of evaluation and management by telemedicine and the availability of in person appointments. The patient expressed understanding and agreed to proceed.   This visit type was conducted due to national recommendations for restrictions regarding the COVID-19 Pandemic (e.g. social distancing).  This format is felt to be most appropriate for this patient at this time.  All issues noted in this document were discussed and addressed.  No physical exam was performed (except for noted visual exam findings with Tele health visits).  The patient has consented to conduct a Tele health visit and understands insurance will be billed.   Chief Complaint  Patient presents with   Chest Pain    HPI  53 y/o [place] American female with GERD, spina bifida with paraparesis, referred for evaluation of atypical chest pain.   Patient has had occasional chest pain since last visit. It "catches her" out of nowhere, usually at rest. Pain has no specific alleviating/relieving factors, no worse with postural changes. Echocardiogram showed moderate mitral regurgitation, trace pericardial effusion.   Initial impression (04/17/2019): By description, does not appear to be angina.  Her EKG does show ST-T wave abnormalities in anteroseptal leads.  However, EKG is unchanged compared to previous EKGs dating back to 2016.  There is no evidence of old infarct.  I will obtain echocardiogram to rule out any structural abnormalities.  Low suspicion of CAD, thus ischemia testing not warranted at this time.  Her chest pain is most likely related to constipation and flatulence or GERD.   Small possibility of pericarditis.  Look for any pericardial effusion on echocardiogram.   Past Medical History:  Diagnosis Date   Abnormal Pap smear 2013   Dislocation of right hip ([place]) 06/09/2018   Per MRI   History of knee replacement 2005   Spina bifida [facility])    Uterine prolapse    Wears glasses      Past Surgical History:  Procedure Laterality Date   TOTAL HIP ARTHROPLASTY Right 12/2018     Social History   Socioeconomic History   Marital status: Single    Spouse name: Not on file   Number of children: 0   Years of education: Not on file   Highest education level: Not on file  Occupational History   Not on file  Social Needs   Financial resource strain: Not on file   Food insecurity    Worry: Not on file    Inability: Not on file   Transportation needs    Medical: Not on file    Non-medical: Not on file  Tobacco Use   Smoking status: Never Smoker   Smokeless tobacco: Never Used  Substance and Sexual Activity   Alcohol use: No    Alcohol/week: 0.0 standard drinks   Drug use: No   Sexual activity: Never  Lifestyle   Physical activity    Days per week: Not on file    Minutes per session: Not on file   Stress: Not on file  Relationships   Social connections    Talks on phone: Not on file    Gets together: Not on file  Attends religious service: Not on file    Active member of club or organization: Not on file    Attends meetings of clubs or organizations: Not on file    Relationship status: Not on file   Intimate partner violence    Fear of current or ex partner: Not on file    Emotionally abused: Not on file    Physically abused: Not on file    Forced sexual activity: Not on file  Other Topics Concern   Not on file  Social History Narrative   ** Merged History Encounter **         Family History  Problem Relation Age of Onset   Hypertension Mother    Hyperlipidemia Mother    Heart attack Brother       Current Outpatient Medications on File Prior to Visit  Medication Sig Dispense Refill   Cholecalciferol 1000 UNITS capsule Take 500 Units by mouth daily.     famotidine (PEPCID) 20 MG tablet Take 1 tablet (20 mg total) by mouth daily. 30 tablet 1   Multiple Vitamin (MULTI-VITAMINS) TABS Take by mouth.     omeprazole (PRILOSEC) 40 MG capsule TAKE 1 CAPSULE BY MOUTH EVERY DAY 90 capsule 1   oxybutynin (DITROPAN XL) 15 MG 24 hr tablet Take 15 mg by mouth 2 (two) times daily.      polyethylene glycol (MIRALAX) packet Take 17 g by mouth daily as needed for moderate constipation or severe constipation. 14 each 0   vitamin C (ASCORBIC ACID) 500 MG tablet Take by mouth.     No current facility-administered medications on file prior to visit.     Cardiovascular studies:  Echocardiogram 05/01/2019: Normal LV systolic function with EF 68%. Normal left ventricular wall thickness. Left ventricle cavity is normal in size. Normal global wall motion. Normal diastolic filling pattern.  Moderate (Grade II) mitral regurgitation. Mild tricuspid regurgitation.  No evidence of pulmonary hypertension. Trace pericardial effusion.  EKG 04/17/2019: Sinus rhythm 73 bpm.  Left atrial enlargement.  Negative precordial T-waves  -Probably normal -consider anteroseptal ischemia.  No change compared to previous EKG's dating back to 2016.    Recent labs:  Results for Mary, Neal (MRN [id number]) as of 04/17/2019 05:25  Ref. Range 02/28/2019 12:14  WBC Latest Ref Range: 3.4 - 10.8 x10E3/uL 4.7  RBC Latest Ref Range: 3.77 - 5.28 x10E6/uL 4.23  Hemoglobin Latest Ref Range: 11.1 - 15.9 g/dL 12.0  HCT Latest Ref Range: 34.0 - 46.6 % 36.1  MCV Latest Ref Range: 79 - 97 fL 85  MCH Latest Ref Range: 26.6 - 33.0 pg 28.4  MCHC Latest Ref Range: 31.5 - 35.7 g/dL 33.2  RDW Latest Ref Range: 11.7 - 15.4 % 12.4  Platelets Latest Ref Range: 150 - 450 x10E3/uL 201   Results for Mary, Neal (MRN  [id number]) as of 04/17/2019 05:25  Ref. Range 02/28/2019 12:14  Sodium Latest Ref Range: 134 - 144 mmol/L 139  Potassium Latest Ref Range: 3.5 - 5.2 mmol/L 4.3  Chloride Latest Ref Range: 96 - 106 mmol/L 103  CO2 Latest Ref Range: 20 - 29 mmol/L 22  Glucose Latest Ref Range: 65 - 99 mg/dL 79  BUN Latest Ref Range: 6 - 24 mg/dL 13  Creatinine Latest Ref Range: 0.57 - 1.00 mg/dL 0.68  Calcium Latest Ref Range: 8.7 - 10.2 mg/dL 9.7  BUN/Creatinine Ratio Latest Ref Range: 9 - 23  19  Alkaline Phosphatase Latest Ref Range: 39 - 117 IU/L 108  Albumin Latest Ref Range: 3.8 - 4.9 g/dL 4.2  Albumin/Globulin Ratio Latest Ref Range: 1.2 - 2.2  1.7  AST Latest Ref Range: 0 - 40 IU/L 22  ALT Latest Ref Range: 0 - 32 IU/L 15  Total Protein Latest Ref Range: 6.0 - 8.5 g/dL 6.7  Total Bilirubin Latest Ref Range: 0.0 - 1.2 mg/dL <0.2  GFR, Est Non African American Latest Ref Range: >59 mL/min/1.73 99  GFR, Est African American Latest Ref Range: >59 mL/min/1.73 115   Results for Mary, Neal (MRN [id number]) as of 04/17/2019 05:25  Ref. Range 02/28/2019 12:14  TSH Latest Ref Range: 0.450 - 4.500 uIU/mL 1.790    Results for Mary, Neal (MRN [id number]) as of 04/17/2019 05:25  Ref. Range 06/19/2018 11:18  Total CHOL/HDL Ratio Latest Ref Range: 0.0 - 4.4 ratio 2.1  Cholesterol, Total Latest Ref Range: 100 - 199 mg/dL 189  HDL Cholesterol Latest Ref Range: >39 mg/dL 92  LDL (calc) Latest Ref Range: 0 - 99 mg/dL 89  Triglycerides Latest Ref Range: 0 - 149 mg/dL 38  VLDL Cholesterol Cal Latest Ref Range: 5 - 40 mg/dL 8     Review of Systems  Constitution: Negative for decreased appetite, malaise/fatigue, weight gain and weight loss.  HENT: Negative for congestion.   Eyes: Negative for visual disturbance.  Cardiovascular: Positive for chest pain. Negative for dyspnea on exertion, leg swelling, palpitations and syncope.  Respiratory: Negative for cough.   Endocrine: Negative for cold  intolerance.  Hematologic/Lymphatic: Does not bruise/bleed easily.  Skin: Negative for itching and rash.  Musculoskeletal: Negative for myalgias.       Spina bifida  Gastrointestinal: Negative for abdominal pain, nausea and vomiting.  Genitourinary: Negative for dysuria.  Neurological: Negative for dizziness and weakness.       Paraparesis of bilateral lower extremities  Psychiatric/Behavioral: The patient is not nervous/anxious.   All other systems reviewed and are negative.      No vitals available.   Objective:   Physical Exam  Constitutional: She is oriented to person, place, and time. She appears well-nourished. No distress.  Patient has corrective appliances in lower extremities  HENT:  Head: Normocephalic and atraumatic.  Pulmonary/Chest: She has no wheezes. She has no rales.  Abdominal: There is no rebound.  Musculoskeletal:        General: No edema.     Comments: Kyphosis  Neurological: She is alert and oriented to person, place, and time. No cranial nerve deficit.  Lower extremity paraparesis  Psychiatric: She has a normal mood and affect.  Nursing note and vitals reviewed.         Assessment & Recommendations:   53 y/o [place] American female with GERD, Spina bifida with paraparesis, referred for evaluation of atypical chest pain.   Chest pain: By description, does not appear to be angina.  Nonspecific ST-T changes in anteroseptal leads, unchanged compared to previous EKG's in 2016. No wall motion abnormalities on echocardiogram,. Mod MR and trace pericardial effusion are incidental findings, but do not explain her pain symptoms. I do think her chest pain is musculoskeletal in nature. Patient may try NSAID's for pain relief. I do not think further cardiac testing is warranted at this time.  I will see her on as needed basis.  Nigel Mormon, MD [facility] [place] Pager: [phone] Office: [phone] If no answer Cell [phone]

## 2019-05-07 ENCOUNTER — Other Ambulatory Visit: Payer: Self-pay | Admitting: Internal Medicine

## 2019-05-07 NOTE — Patient Instructions (Signed)
Visit Information  Goals Addressed    . "We need a Cardiology referral"       Mother stated Current Barriers:  Marland Kitchen Knowledge Deficits related to diagnosis and treatment for intermittent chest pain   Nurse Case Manager Clinical Goal(s):  Marland Kitchen Over the next 30 days, patient will verbalize understanding of plan for referral to Cardiologist   GOAL MET . Over the next 90 days, patient will have no ED visits and or IP admissions related to Cardiopulmonary symptoms  CCM RN CM Interventions:  05/02/19 call completed with patient's mother [name]  . Evaluation of current treatment plan related to symptoms suggestive of Angina and patient's adherence to plan as established by provider . Reviewed medications with patient's mother and discussed patient completed her Cardiology referral with Dr. Virgina Jock on 04/17/19; reviewed and discussed the office note to indicate the Cardiology noted the following: "Low suspicion of CAD, thus ischemia testing not warranted at this time.  Her chest pain is most likely related to constipation and flatulence or GERD.  Small possibility of pericarditis.  Look for any pericardial effusion on echocardiogram" . Reviewed Echo OV is scheduled for 05/08/19 _0 :30 PM . Discussed plans with patient for ongoing care management follow up and provided patient with direct contact information for care management team  Patient Self Care Activities:  . Currently UNABLE TO independently perform ADL's  Please see past updates related to this goal by clicking on the "Past Updates" button in the selected goal         The patient verbalized understanding of instructions provided today and declined a print copy of patient instruction materials.   Telephone follow up appointment with care management team member scheduled for: 06/14/19  Barb Merino, RN, BSN, CCM Care Management Coordinator [facility]  Direct Phone: [phone]

## 2019-05-07 NOTE — Chronic Care Management (AMB) (Signed)
Chronic Care Management   Follow Up Note   05/02/2019 Name: Mary Neal MRN: [id number] DOB: 03/22/1966  Referred by: Glendale Chard, MD Reason for referral : Chronic Care Management (CCM RNCM Telephone Follow up )   Mary Neal is a 53 y.o. year old female who is a primary care patient of Glendale Chard, MD. The CCM team was consulted for assistance with chronic disease management and care coordination needs.    Review of patient status, including review of consultants reports, relevant laboratory and other test results, and collaboration with appropriate care team members and the patient's provider was performed as part of comprehensive patient evaluation and provision of chronic care management services.    SDOH (Social Determinants of Health) screening performed today: None. See Care Plan for related entries.   Advanced Directives Status: N See Care Plan and [facility] application for related entries.  I spoke with Ms. [place] mother [name] today to f/u on patient's Cardiovascular status.   Outpatient Encounter Medications as of 05/02/2019  Medication Sig  . Cholecalciferol 1000 UNITS capsule Take 500 Units by mouth daily.  . Multiple Vitamin (MULTI-VITAMINS) TABS Take by mouth.  Marland Kitchen omeprazole (PRILOSEC) 40 MG capsule TAKE 1 CAPSULE BY MOUTH EVERY DAY  . oxybutynin (DITROPAN XL) 15 MG 24 hr tablet Take 15 mg by mouth 2 (two) times daily.   . polyethylene glycol (MIRALAX) packet Take 17 g by mouth daily as needed for moderate constipation or severe constipation.  . vitamin C (ASCORBIC ACID) 500 MG tablet Take by mouth.  . [DISCONTINUED] famotidine (PEPCID) 20 MG tablet Take 1 tablet (20 mg total) by mouth daily.   No facility-administered encounter medications on file as of 05/02/2019.      Goals Addressed    . "We need a Cardiology referral"       Mother stated Current Barriers:  Marland Kitchen Knowledge Deficits related to diagnosis and treatment for intermittent chest pain    Nurse Case Manager Clinical Goal(s):  Marland Kitchen Over the next 30 days, patient will verbalize understanding of plan for referral to Cardiologist   GOAL MET . Over the next 90 days, patient will have no ED visits and or IP admissions related to Cardiopulmonary symptoms  CCM RN CM Interventions:  05/02/19 call completed with patient's mother [name]  . Evaluation of current treatment plan related to symptoms suggestive of Angina and patient's adherence to plan as established by provider . Reviewed and discussed patient's completed Cardiology visit with Dr. Virgina Jock on 04/17/19; reviewed and discussed the office note to indicate the Cardiology noted the following: "Low suspicion of CAD, thus ischemia testing not warranted at this time.  Her chest pain is most likely related to constipation and flatulence or GERD.  Small possibility of pericarditis.  Look for any pericardial effusion on echocardiogram" . Reviewed Echo OV is scheduled for 05/08/19 _0 :30 PM . Discussed patient has experienced no further episodes of chest pain  . Discussed plans with patient for ongoing care management follow up and provided patient with direct contact information for care management team  Patient Self Care Activities:  . Currently UNABLE TO independently perform ADL's  Please see past updates related to this goal by clicking on the "Past Updates" button in the selected goal          Telephone follow up appointment with care management team member scheduled for: 06/12/19  Barb Merino, RN, BSN, CCM Care Management Coordinator [place] [facility]  Direct Phone: [phone]

## 2019-05-08 ENCOUNTER — Encounter: Payer: Self-pay | Admitting: Internal Medicine

## 2019-05-08 ENCOUNTER — Other Ambulatory Visit: Payer: Self-pay

## 2019-05-08 ENCOUNTER — Telehealth (INDEPENDENT_AMBULATORY_CARE_PROVIDER_SITE_OTHER): Payer: Medicare Other | Admitting: Cardiology

## 2019-05-08 DIAGNOSIS — Q059 Spina bifida, unspecified: Secondary | ICD-10-CM

## 2019-05-08 DIAGNOSIS — I340 Nonrheumatic mitral (valve) insufficiency: Secondary | ICD-10-CM

## 2019-05-08 DIAGNOSIS — R0789 Other chest pain: Secondary | ICD-10-CM

## 2019-06-08 ENCOUNTER — Other Ambulatory Visit: Payer: Self-pay | Admitting: Internal Medicine

## 2019-06-13 DIAGNOSIS — N319 Neuromuscular dysfunction of bladder, unspecified: Secondary | ICD-10-CM | POA: Diagnosis not present

## 2019-06-13 DIAGNOSIS — N390 Urinary tract infection, site not specified: Secondary | ICD-10-CM | POA: Diagnosis not present

## 2019-06-14 ENCOUNTER — Telehealth: Payer: Self-pay

## 2019-06-14 ENCOUNTER — Ambulatory Visit: Payer: Self-pay

## 2019-06-14 DIAGNOSIS — Q059 Spina bifida, unspecified: Secondary | ICD-10-CM

## 2019-06-14 DIAGNOSIS — M1712 Unilateral primary osteoarthritis, left knee: Secondary | ICD-10-CM

## 2019-06-14 DIAGNOSIS — Q6589 Other specified congenital deformities of hip: Secondary | ICD-10-CM

## 2019-06-14 DIAGNOSIS — K219 Gastro-esophageal reflux disease without esophagitis: Secondary | ICD-10-CM

## 2019-06-14 NOTE — Chronic Care Management (AMB) (Signed)
  Chronic Care Management   Outreach Note  06/14/2019 Name: Mary Neal MRN: [id number] DOB: Mar 12, 1966  Referred by: Glendale Chard, MD Reason for referral : Chronic Care Management (CCM RNCM Telephone Follow up )   An unsuccessful telephone outreach was attempted today. The patient was referred to the case management team by Glendale Chard MD for assistance with care management and care coordination.   Follow Up Plan: Telephone follow up appointment with care management team member scheduled for: 06/28/19  Barb Merino, RN, BSN, CCM Care Management Coordinator [facility] [facility]  Direct Phone: [phone]

## 2019-06-24 ENCOUNTER — Ambulatory Visit (INDEPENDENT_AMBULATORY_CARE_PROVIDER_SITE_OTHER): Payer: Medicare Other

## 2019-06-24 DIAGNOSIS — M1712 Unilateral primary osteoarthritis, left knee: Secondary | ICD-10-CM

## 2019-06-24 DIAGNOSIS — Q059 Spina bifida, unspecified: Secondary | ICD-10-CM

## 2019-06-24 DIAGNOSIS — K219 Gastro-esophageal reflux disease without esophagitis: Secondary | ICD-10-CM

## 2019-06-24 DIAGNOSIS — Q6589 Other specified congenital deformities of hip: Secondary | ICD-10-CM

## 2019-06-26 NOTE — Patient Instructions (Signed)
Visit Information  Goals Addressed    . "To have no complications from GERD"       Current Barriers:  . Knowledge Deficits related to disease process and Self Health management of Gastroesophageal Reflux Disorder  . Chronic Disease Management support and education needs related to GERD  Nurse Case Manager Clinical Goal(s):  . Over the next 90 days, patient will verbalize basic understanding of GERD disease process and self health management plan as evidenced by patient will experience having no active symptoms or complications secondary to having GERD  CCM RN CM Interventions:  06/24/19 call completed with mother [name]   . Evaluation of current treatment plan related to GERD and patient's adherence to plan as established by provider. . Discussed plans with patient for ongoing care management follow up and provided patient with direct contact information for care management team . Provided patient with printed educational materials related to Gastroesophageal Reflux Disease   Patient Self Care Activities:  . Patient is unable to independently perform self care  Initial goal documentation     . "To have no complications related to Impaired Urinary Elimination"       Current Barriers:  . Knowledge Deficits related to treatment management for Impaired Urinary Elimination  . Chronic Disease Management support and education needs related to Impaired Urinary Elimination secondary to Spina Bifida   Nurse Case Manager Clinical Goal(s):  . Over the next 90 days, patient will work with CCM RN CM to address needs related to Impaired Urinary Elimination  . Over the next 120 days, patient will experience no UTI's and or other complications secondary to having Impaired Urinary Elimination   CCM RN CM Interventions:  06/24/19 call completed with mother [name]  . Evaluation of current treatment plan related to Impaired Urinary Elimination  and patient's adherence to plan as established by  provider. . Discussed plans with patient for ongoing care management follow up and provided patient with direct contact information for care management team . Provided patient with printed educational materials related to Urinary Tract Infections: Learn how to Spot Them and Treat Them  . Reviewed scheduled/upcoming provider appointments including: AWV w/Dr. Sanders scheduled for 06/27/19 @11:00 AM   Patient Self Care Activities:  . Unable to independently perform Self Care  Initial goal documentation     . "To maintain adequate joint mobility"       Current Barriers:  . Knowledge deficits related to Impaired Physical Mobility and Joint Immobility . Chronic Disease Management support and education needs related to Osteoarthritis of left knee and Congenital dysplasia or right hip  Nurse Case Manager Clinical Goal(s):  . Over the next 90 days, the patient will demonstrate ongoing self health care management ability as evidenced by patient will verbalize ongoing adherence to her prescribed HEP (home exercise plan) * . Over the next 120 days, patient will experience no complications secondary to impaired physical mobility such as contractures, decreased ROM, pain or discomfort or falls   CCM RN CM Interventions:  06/24/19 call completed with mother [name]  . Evaluation of current treatment plan related to Impaired Physical Mobility  and patient's adherence to plan as established by provider. . Discussed plans with patient for ongoing care management follow up and provided patient with direct contact information for care management team . Provided patient with printed educational materials related to Aging with a Physical Disability   Patient Self Care Activities:  . Unable to independently perform Self Care  Initial goal documentation     . "  To maintain good skin intregrity"       Current Barriers:  . Knowledge Deficits related to maintaining good skin integrity and when to call the  doctor for early signs of impaired skin integrity . Chronic Disease Management support and education needs related to Spina Bifida and high risk for impaired skin integrity   Nurse Case Manager Clinical Goal(s):  . Over the next 90 days, patient will verbalize basic understanding of Impaired skin integrity disease process and self health management plan as evidenced by patient and caregiver will be able to verbalize how to recognize early signs/symptoms of skin breakdown, have a good understanding about skin breakdown prevention, know when to call the doctor  . Over the next 120 days, patient will maintain good skin integrity as evidence by patient will experience having no pressure ulcerations, breaks in her skin or hospitalization for skin infection or breakdown   CCM RN CM Interventions:  06/24/19 call completed with mother [name]  . Evaluation of current treatment plan related to high risk for Impaired Skin Integrity and patient's adherence to plan as established by provider. . Discussed plans with patient for ongoing care management follow up and provided patient with direct contact information for care management team . Provided patient with printed educational materials related to Recognizing and Treating Pressure Sores  Patient Self Care Activities:  . Unable to independently perform self care  Initial goal documentation     . COMPLETED: "We need a Cardiology referral"       Mother stated Current Barriers:  . Knowledge Deficits related to diagnosis and treatment for intermittent chest pain   Nurse Case Manager Clinical Goal(s):  . Over the next 30 days, patient will verbalize understanding of plan for referral to Cardiologist   GOAL MET . Over the next 90 days, patient will have no ED visits and or IP admissions related to Cardiopulmonary symptoms  CCM RN CM Interventions:  06/24/19 call completed with patient's mother Harriet  . Evaluation of current treatment plan related  to symptoms suggestive of Angina and patient's adherence to plan as established by provider . Reviewed medications with patient's mother and discussed patient completed her Cardiology referral with Dr. Patwardhan on 04/17/19; reviewed and discussed the office note to indicate the Cardiology noted the following: "Low suspicion of CAD, thus ischemia testing not warranted at this time.  Her chest pain is most likely related to constipation and flatulence or GERD.  Small possibility of pericarditis.  Look for any pericardial effusion on echocardiogram" . Reviewed Echo impression obtained on 05/01/19 per Dr. Patwardhan;  Echocardiogram 05/01/2019: - Normal LV systolic function with EF 68%. Normal left ventricular wall thickness. Left ventricle cavity is normal in size. Normal global wall motion. Normal diastolic filling pattern.  - Moderate (Grade II) mitral regurgitation. - Mild tricuspid regurgitation.  - No evidence of pulmonary hypertension. - Trace pericardial effusion Patient was advised to take NSAID's if desired for chest pain and to follow up with Cardiology as needed Discussed plans with patient for ongoing care management follow up and provided patient with direct contact information for care management team  Patient Self Care Activities:  . Currently UNABLE TO independently perform ADL's  Please see past updates related to this goal by clicking on the "Past Updates" button in the selected goal         The patient verbalized understanding of instructions provided today and declined a print copy of patient instruction materials.   Telephone follow up appointment with care   management team member scheduled for: 08/19/19  Angel Little, RN, BSN, CCM Care Management Coordinator [facility]  Direct Phone: [phone]    

## 2019-06-26 NOTE — Chronic Care Management (AMB) (Signed)
Chronic Care Management   Follow Up Note   06/24/2019 Name: Mary Neal MRN: [id number] DOB: 02-26-1966  Referred by: Mary Chard, MD Reason for referral : Chronic Care Management (CCM RNCM Telephone Follow up )   Mary Neal is a 53 y.o. year old female who is a primary care patient of Mary Chard, MD. The CCM team was consulted for assistance with chronic disease management and care coordination needs.    Review of patient status, including review of consultants reports, relevant laboratory and other test results, and collaboration with appropriate care team members and the patient's provider was performed as part of comprehensive patient evaluation and provision of chronic care management services.    SDOH (Social Determinants of Health) screening performed today: None. See Care Plan for related entries.   Placed outbound call to patient's mother for a CCM RN CM update.   Outpatient Encounter Medications as of 06/24/2019  Medication Sig  . Cholecalciferol 1000 UNITS capsule Take 500 Units by mouth daily.  . famotidine (PEPCID) 20 MG tablet TAKE 1 TABLET BY MOUTH EVERY DAY  . Multiple Vitamin (MULTI-VITAMINS) TABS Take by mouth.  Marland Kitchen omeprazole (PRILOSEC) 40 MG capsule TAKE 1 CAPSULE BY MOUTH EVERY DAY  . oxybutynin (DITROPAN XL) 15 MG 24 hr tablet Take 15 mg by mouth 2 (two) times daily.   . polyethylene glycol (MIRALAX) packet Take 17 g by mouth daily as needed for moderate constipation or severe constipation.  . vitamin C (ASCORBIC ACID) 500 MG tablet Take by mouth.   No facility-administered encounter medications on file as of 06/24/2019.     Goals Addressed    . "To have no complications from GERD"       Current Barriers:  Marland Kitchen Knowledge Deficits related to disease process and Self Health management of Gastroesophageal Reflux Disorder  . Chronic Disease Management support and education needs related to GERD  Nurse Case Manager Clinical Goal(s):  Marland Kitchen Over  the next 90 days, patient will verbalize basic understanding of GERD disease process and self health management plan as evidenced by patient will experience having no active symptoms or complications secondary to having GERD  CCM RN CM Interventions:  06/24/19 call completed with [name]   . Evaluation of current treatment plan related to GERD and patient's adherence to plan as established by provider. . Discussed plans with patient for ongoing care management follow up and provided patient with direct contact information for care management team . Provided patient with printed educational materials related to Gastroesophageal Reflux Disease   Patient Self Care Activities:  . Patient is unable to independently perform self care  Initial goal documentation     . "To have no complications related to Impaired Urinary Elimination"       Current Barriers:  Marland Kitchen Knowledge Deficits related to treatment management for Impaired Urinary Elimination  . Chronic Disease Management support and education needs related to Impaired Urinary Elimination secondary to Spina Bifida   Nurse Case Manager Clinical Goal(s):  Marland Kitchen Over the next 90 days, patient will work with CCM RN CM to address needs related to Impaired Urinary Elimination  . Over the next 120 days, patient will experience no UTI's and or other complications secondary to having Impaired Urinary Elimination   CCM RN CM Interventions:  06/24/19 call completed with [name]  . Evaluation of current treatment plan related to Impaired Urinary Elimination  and patient's adherence to plan as established by provider. . Discussed plans with patient for  ongoing care management follow up and provided patient with direct contact information for care management team . Provided patient with printed educational materials related to Urinary Tract Infections: Learn how to Spot Them and Treat Them  . Reviewed scheduled/upcoming provider appointments  including: AWV w/Dr. Baird Neal scheduled for 06/27/19 _0 :00 AM   Patient Self Care Activities:  . Unable to independently perform Self Care  Initial goal documentation     . "To maintain adequate joint mobility"       Current Barriers:  Marland Kitchen Knowledge deficits related to Impaired Physical Mobility and Joint Immobility . Chronic Disease Management support and education needs related to Osteoarthritis of left knee and Congenital dysplasia or right hip  Nurse Case Manager Clinical Goal(s):  Marland Kitchen Over the next 90 days, the patient will demonstrate ongoing self health care management ability as evidenced by patient will verbalize ongoing adherence to her prescribed HEP (home exercise plan) * . Over the next 120 days, patient will experience no complications secondary to impaired physical mobility such as contractures, decreased ROM, pain or discomfort or falls   CCM RN CM Interventions:  06/24/19 call completed with [name]  . Evaluation of current treatment plan related to Impaired Physical Mobility  and patient's adherence to plan as established by provider. . Discussed plans with patient for ongoing care management follow up and provided patient with direct contact information for care management team . Provided patient with printed educational materials related to Aging with a Physical Disability   Patient Self Care Activities:  . Unable to independently perform Self Care  Initial goal documentation     . "To maintain good skin intregrity"       Current Barriers:  Marland Kitchen Knowledge Deficits related to maintaining good skin integrity and when to call the doctor for early signs of impaired skin integrity . Chronic Disease Management support and education needs related to Spina Bifida and high risk for impaired skin integrity   Nurse Case Manager Clinical Goal(s):  Marland Kitchen Over the next 90 days, patient will verbalize basic understanding of Impaired skin integrity disease process and self health  management plan as evidenced by patient and caregiver will be able to verbalize how to recognize early signs/symptoms of skin breakdown, have a good understanding about skin breakdown prevention, know when to call the doctor  . Over the next 120 days, patient will maintain good skin integrity as evidence by patient will experience having no pressure ulcerations, breaks in her skin or hospitalization for skin infection or breakdown   CCM RN CM Interventions:  06/24/19 call completed with [name]  . Evaluation of current treatment plan related to high risk for Impaired Skin Integrity and patient's adherence to plan as established by provider. . Discussed plans with patient for ongoing care management follow up and provided patient with direct contact information for care management team . Provided patient with printed educational materials related to Recognizing and Treating Pressure Sores  Patient Self Care Activities:  . Unable to independently perform self care  Initial goal documentation     . COMPLETED: "We need a Cardiology referral"       Mother stated Current Barriers:  Marland Kitchen Knowledge Deficits related to diagnosis and treatment for intermittent chest pain   Nurse Case Manager Clinical Goal(s):  Marland Kitchen Over the next 30 days, patient will verbalize understanding of plan for referral to Cardiologist   GOAL MET . Over the next 90 days, patient will have no ED visits and or IP admissions  related to Cardiopulmonary symptoms  CCM RN CM Interventions:  06/24/19 call completed with patient's mother [name]  . Evaluation of current treatment plan related to symptoms suggestive of Angina and patient's adherence to plan as established by provider . Reviewed medications with patient's mother and discussed patient completed her Cardiology referral with Dr. Virgina Jock on 04/17/19; reviewed and discussed the office note to indicate the Cardiology noted the following: "Low suspicion of CAD, thus  ischemia testing not warranted at this time.  Her chest pain is most likely related to constipation and flatulence or GERD.  Small possibility of pericarditis.  Look for any pericardial effusion on echocardiogram" . Reviewed Echo impression obtained on 05/01/19 per Dr. Virgina Jock;  Echocardiogram 05/01/2019: - Normal LV systolic function with EF 68%. Normal left ventricular wall thickness. Left ventricle cavity is normal in size. Normal global wall motion. Normal diastolic filling pattern.  - Moderate (Grade II) mitral regurgitation. - Mild tricuspid regurgitation.  - No evidence of pulmonary hypertension. - Trace pericardial effusion Patient was advised to take NSAID's if desired for chest pain and to follow up with Cardiology as needed Discussed plans with patient for ongoing care management follow up and provided patient with direct contact information for care management team  Patient Self Care Activities:  . Currently UNABLE TO independently perform ADL's  Please see past updates related to this goal by clicking on the "Past Updates" button in the selected goal          Telephone follow up appointment with care management team member scheduled for: 08/19/19   Barb Merino, RN, BSN, CCM Care Management Coordinator [place] [facility]  Direct Phone: [phone]

## 2019-06-27 ENCOUNTER — Ambulatory Visit (INDEPENDENT_AMBULATORY_CARE_PROVIDER_SITE_OTHER): Payer: Medicare Other

## 2019-06-27 ENCOUNTER — Other Ambulatory Visit: Payer: Self-pay

## 2019-06-27 ENCOUNTER — Encounter: Payer: Self-pay | Admitting: Internal Medicine

## 2019-06-27 ENCOUNTER — Ambulatory Visit (INDEPENDENT_AMBULATORY_CARE_PROVIDER_SITE_OTHER): Payer: Medicare Other | Admitting: Internal Medicine

## 2019-06-27 VITALS — BP 150/80 | HR 96 | Temp 98.3°F | Ht <= 58 in | Wt 112.4 lb

## 2019-06-27 DIAGNOSIS — Z0000 Encounter for general adult medical examination without abnormal findings: Secondary | ICD-10-CM

## 2019-06-27 DIAGNOSIS — R030 Elevated blood-pressure reading, without diagnosis of hypertension: Secondary | ICD-10-CM

## 2019-06-27 DIAGNOSIS — Z79899 Other long term (current) drug therapy: Secondary | ICD-10-CM | POA: Diagnosis not present

## 2019-06-27 DIAGNOSIS — K219 Gastro-esophageal reflux disease without esophagitis: Secondary | ICD-10-CM

## 2019-06-27 NOTE — Progress Notes (Signed)
This visit occurred during the SARS-CoV-2 public health emergency.  Safety protocols were in place, including screening questions prior to the visit, additional usage of staff PPE, and extensive cleaning of exam room while observing appropriate contact time as indicated for disinfecting solutions.  Subjective:   Mary Neal is a 53 y.o. female who presents for Medicare Annual (Subsequent) preventive examination.  Review of Systems:  n/a Cardiac Risk Factors include: sedentary lifestyle     Objective:     Vitals: BP (!) 150/80 (BP Location: Left Arm, Patient Position: Sitting, Cuff Size: Normal)   Pulse 96   Temp 98.3 F (36.8 C) (Oral)   Ht 4\' 9"  (1.448 m)   Wt 112 lb 6.4 oz (51 kg)   SpO2 98%   BMI 24.32 kg/m   Body mass index is 24.32 kg/m.  Advanced Directives 06/27/2019 01/11/2016 10/14/2015 08/07/2015 06/12/2015 05/15/2015 04/13/2015  Does Patient Have a Medical Advance Directive? Yes No No No No No No  Type of Paramedic of [place];Living will - - - - - -  Copy of [place] in Chart? No - copy requested - - - - - -  Would patient like information on creating a medical advance directive? - Yes - Scientist, clinical (histocompatibility and immunogenetics) given Yes - Scientist, clinical (histocompatibility and immunogenetics) given Yes - Scientist, clinical (histocompatibility and immunogenetics) given - - Yes - Scientist, clinical (histocompatibility and immunogenetics) given    Tobacco Social History   Tobacco Use  Smoking Status Never Smoker  Smokeless Tobacco Never Used     Counseling given: Not Answered   Clinical Intake:  Pre-visit preparation completed: Yes  Pain : No/denies pain     Nutritional Status: BMI of 19-24  Normal Nutritional Risks: None Diabetes: No  How often do you need to have someone help you when you read instructions, pamphlets, or other written materials from your doctor or pharmacy?: 1 - Never What is the last grade level you completed in school?: 12th grade  Interpreter Needed?: No  Information entered by :: NAllen LPN  Past  Medical History:  Diagnosis Date  . Abnormal Pap smear 2013  . Dislocation of right hip ([place]) 06/09/2018   Per MRI  . History of knee replacement 2005  . Spina bifida ([place])   . Uterine prolapse   . Wears glasses    Past Surgical History:  Procedure Laterality Date  . TOTAL HIP ARTHROPLASTY Right 12/2018   Family History  Problem Relation Age of Onset  . Hypertension Mother   . Hyperlipidemia Mother   . Heart attack Brother    Social History   Socioeconomic History  . Marital status: Single    Spouse name: Not on file  . Number of children: 0  . Years of education: Not on file  . Highest education level: Not on file  Occupational History  . Occupation: unemployed  Tobacco Use  . Smoking status: Never Smoker  . Smokeless tobacco: Never Used  Substance and Sexual Activity  . Alcohol use: No    Alcohol/week: 0.0 standard drinks  . Drug use: No  . Sexual activity: Never  Other Topics Concern  . Not on file  Social History Narrative   ** Merged History Encounter **       Social Determinants of Health   Financial Resource Strain: Low Risk   . Difficulty of Paying Living Expenses: Not hard at all  Food Insecurity: No Food Insecurity  . Worried About Charity fundraiser in the Last Year: Never true  .  Ran Out of Food in the Last Year: Never true  Transportation Needs: No Transportation Needs  . Lack of Transportation (Medical): No  . Lack of Transportation (Non-Medical): No  Physical Activity: Inactive  . Days of Exercise per Week: 0 days  . Minutes of Exercise per Session: 0 min  Stress: No Stress Concern Present  . Feeling of Stress : Not at all  Social Connections:   . Frequency of Communication with Friends and Family: Not on file  . Frequency of Social Gatherings with Friends and Family: Not on file  . Attends Religious Services: Not on file  . Active Member of Clubs or Organizations: Not on file  . Attends Archivist Meetings: Not on file  .  Marital Status: Not on file    Outpatient Encounter Medications as of 06/27/2019  Medication Sig  . Cholecalciferol 1000 UNITS capsule Take 500 Units by mouth daily.  . famotidine (PEPCID) 20 MG tablet TAKE 1 TABLET BY MOUTH EVERY DAY  . Multiple Vitamin (MULTI-VITAMINS) TABS Take by mouth.  Marland Kitchen omeprazole (PRILOSEC) 40 MG capsule TAKE 1 CAPSULE BY MOUTH EVERY DAY  . oxybutynin (DITROPAN XL) 15 MG 24 hr tablet Take 15 mg by mouth 2 (two) times daily.   Marland Kitchen senna (SENOKOT) 8.6 MG TABS tablet Take 1 tablet by mouth. Takes 1 tab every other day  . vitamin C (ASCORBIC ACID) 500 MG tablet Take by mouth.  . polyethylene glycol (MIRALAX) packet Take 17 g by mouth daily as needed for moderate constipation or severe constipation. (Patient not taking: Reported on 06/27/2019)   No facility-administered encounter medications on file as of 06/27/2019.    Activities of Daily Living In your present state of health, do you have any difficulty performing the following activities: 06/27/2019 02/28/2019  Hearing? N N  Vision? N N  Difficulty concentrating or making decisions? N N  Walking or climbing stairs? Y Y  Dressing or bathing? Y Y  Doing errands, shopping? Tempie Donning  Preparing Food and eating ? Y -  Using the Toilet? Y -  In the past six months, have you accidently leaked urine? Y -  Comment has incontinence -  Managing your Medications? N -  Managing your Finances? Y -  Housekeeping or managing your Housekeeping? Y -  Some recent data might be hidden    Patient Care Team: Glendale Chard, MD as PCP - General (Internal Medicine) Rex Kras, Claudette Stapler, RN as Case Manager Daneen Schick as Social Worker    Assessment:   This is a routine wellness examination for [name].  Exercise Activities and Dietary recommendations Current Exercise Habits: The patient does not participate in regular exercise at present  Goals    . "To have no complications from GERD"     Current Barriers:  Marland Kitchen Knowledge Deficits  related to disease process and Self Health management of Gastroesophageal Reflux Disorder  . Chronic Disease Management support and education needs related to GERD  Nurse Case Manager Clinical Goal(s):  Marland Kitchen Over the next 90 days, patient will verbalize basic understanding of GERD disease process and self health management plan as evidenced by patient will experience having no active symptoms or complications secondary to having GERD  CCM RN CM Interventions:  06/24/19 call completed with mother [name]   . Evaluation of current treatment plan related to GERD and patient's adherence to plan as established by provider. . Discussed plans with patient for ongoing care management follow up and provided patient with direct contact information  for care management team . Provided patient with printed educational materials related to Gastroesophageal Reflux Disease   Patient Self Care Activities:  . Patient is unable to independently perform self care  Initial goal documentation     . "To have no complications related to Impaired Urinary Elimination"     Current Barriers:  Marland Kitchen Knowledge Deficits related to treatment management for Impaired Urinary Elimination  . Chronic Disease Management support and education needs related to Impaired Urinary Elimination secondary to Spina Bifida   Nurse Case Manager Clinical Goal(s):  Marland Kitchen Over the next 90 days, patient will work with CCM RN CM to address needs related to Impaired Urinary Elimination  . Over the next 120 days, patient will experience no UTI's and or other complications secondary to having Impaired Urinary Elimination   CCM RN CM Interventions:  06/24/19 call completed with mother [name]  . Evaluation of current treatment plan related to Impaired Urinary Elimination  and patient's adherence to plan as established by provider. . Discussed plans with patient for ongoing care management follow up and provided patient with direct contact information  for care management team . Provided patient with printed educational materials related to Urinary Tract Infections: Learn how to Spot Them and Treat Them  . Reviewed scheduled/upcoming provider appointments including: AWV w/Dr. Baird Cancer scheduled for 06/27/19 @11 :00 AM   Patient Self Care Activities:  . Unable to independently perform Self Care  Initial goal documentation     . "To maintain adequate joint mobility"     Current Barriers:  Marland Kitchen Knowledge deficits related to Impaired Physical Mobility and Joint Immobility . Chronic Disease Management support and education needs related to Osteoarthritis of left knee and Congenital dysplasia or right hip  Nurse Case Manager Clinical Goal(s):  Marland Kitchen Over the next 90 days, the patient will demonstrate ongoing self health care management ability as evidenced by patient will verbalize ongoing adherence to her prescribed HEP (home exercise plan) * . Over the next 120 days, patient will experience no complications secondary to impaired physical mobility such as contractures, decreased ROM, pain or discomfort or falls   CCM RN CM Interventions:  06/24/19 call completed with mother [name]  . Evaluation of current treatment plan related to Impaired Physical Mobility  and patient's adherence to plan as established by provider. . Discussed plans with patient for ongoing care management follow up and provided patient with direct contact information for care management team . Provided patient with printed educational materials related to Aging with a Physical Disability   Patient Self Care Activities:  . Unable to independently perform Self Care  Initial goal documentation     . "To maintain good skin intregrity"     Current Barriers:  Marland Kitchen Knowledge Deficits related to maintaining good skin integrity and when to call the doctor for early signs of impaired skin integrity . Chronic Disease Management support and education needs related to Spina Bifida and  high risk for impaired skin integrity   Nurse Case Manager Clinical Goal(s):  Marland Kitchen Over the next 90 days, patient will verbalize basic understanding of Impaired skin integrity disease process and self health management plan as evidenced by patient and caregiver will be able to verbalize how to recognize early signs/symptoms of skin breakdown, have a good understanding about skin breakdown prevention, know when to call the doctor  . Over the next 120 days, patient will maintain good skin integrity as evidence by patient will experience having no pressure ulcerations, breaks in her skin or  hospitalization for skin infection or breakdown   CCM RN CM Interventions:  06/24/19 call completed with mother [name]  . Evaluation of current treatment plan related to high risk for Impaired Skin Integrity and patient's adherence to plan as established by provider. . Discussed plans with patient for ongoing care management follow up and provided patient with direct contact information for care management team . Provided patient with printed educational materials related to Recognizing and Treating Pressure Sores  Patient Self Care Activities:  . Unable to independently perform self care  Initial goal documentation     . Weight (lb) < 200 lb (90.7 kg)     06/27/2019, wants to weigh 105 pounds       Fall Risk Fall Risk  06/27/2019 04/15/2019 02/28/2019 01/08/2019 06/19/2018  Falls in the past year? 1 1 1  0 1  Number falls in past yr: 1 1 1  - 0  Injury with Fall? 0 1 0 - 0  Risk for fall due to : History of fall(s);Medication side effect - - - -  Risk for fall due to: Comment - - - - -  Follow up Falls evaluation completed;Education provided;Falls prevention discussed - - - -   Is the patient's home free of loose throw rugs in walkways, pet beds, electrical cords, etc?   yes      Grab bars in the bathroom? yes      Handrails on the stairs?   yes      Adequate lighting?   yes  Timed Get Up and Go  performed: n/a  Depression Screen PHQ 2/9 Scores 06/27/2019 04/15/2019 01/08/2019 04/13/2015  PHQ - 2 Score 0 0 0 2  PHQ- 9 Score 0 - - -     Cognitive Function     6CIT Screen 06/27/2019  What Year? 0 points  What month? 0 points  What time? 0 points  Count back from 20 0 points  Months in reverse 4 points  Repeat phrase 10 points  Total Score 14    Immunization History  Administered Date(s) Administered  . Influenza,inj,Quad PF,6+ Mos 06/19/2018, 04/15/2019  . Tdap 05/07/2019    Qualifies for Shingles Vaccine? y  Screening Tests Health Maintenance  Topic Date Due  . PAP SMEAR-Modifier  02/01/1986  . HIV Screening  04/14/2020 (Originally 02/02/1980)  . MAMMOGRAM  06/27/2020  . COLONOSCOPY  05/19/2025  . TETANUS/TDAP  05/06/2029  . INFLUENZA VACCINE  Completed    Cancer Screenings: Lung: Low Dose CT Chest recommended if Age 97-80 years, 30 pack-year currently smoking OR have quit w/in 15years. Patient does not qualify. Breast:  Up to date on Mammogram? Yes   Up to date of Bone Density/Dexa? Yes Colorectal: up to date  Additional Screenings: : Hepatitis C Screening: n/a     Plan:    Patient would like to get down to 105 pounds.   I have personally reviewed and noted the following in the patient's chart:   . Medical and social history . Use of alcohol, tobacco or illicit drugs  . Current medications and supplements . Functional ability and status . Nutritional status . Physical activity . Advanced directives . List of other physicians . Hospitalizations, surgeries, and ER visits in previous 12 months . Vitals . Screenings to include cognitive, depression, and falls . Referrals and appointments  In addition, I have reviewed and discussed with patient certain preventive protocols, quality metrics, and best practice recommendations. A written personalized care plan for preventive services as well as  general preventive health recommendations were provided to  patient.     Kellie Simmering, LPN  [id number]

## 2019-06-27 NOTE — Patient Instructions (Signed)
Mary Neal , Thank you for taking time to come for your Medicare Wellness Visit. I appreciate your ongoing commitment to your health goals. Please review the following plan we discussed and let me know if I can assist you in the future.   Screening recommendations/referrals: Colonoscopy: 05/2015 Mammogram: 06/2018 Bone Density: n/a Recommended yearly ophthalmology/optometry visit for glaucoma screening and checkup Recommended yearly dental visit for hygiene and checkup  Vaccinations: Influenza vaccine: 04/2019 Pneumococcal vaccine: n/a Tdap vaccine: 04/2019 Shingles vaccine: discussed    Advanced directives: Please bring a copy of your POA (Power of [place]) and/or Living Will to your next appointment.    Conditions/risks identified: spina bifida  Next appointment: 01/07/2020 at 11:15  Preventive Care 40-64 Years, Female Preventive care refers to lifestyle choices and visits with your health care provider that can promote health and wellness. What does preventive care include?  A yearly physical exam. This is also called an annual well check.  Dental exams once or twice a year.  Routine eye exams. Ask your health care provider how often you should have your eyes checked.  Personal lifestyle choices, including:  Daily care of your teeth and gums.  Regular physical activity.  Eating a healthy diet.  Avoiding tobacco and drug use.  Limiting alcohol use.  Practicing safe sex.  Taking low-dose aspirin daily starting at age 67.  Taking vitamin and mineral supplements as recommended by your health care provider. What happens during an annual well check? The services and screenings done by your health care provider during your annual well check will depend on your age, overall health, lifestyle risk factors, and family history of disease. Counseling  Your health care provider may ask you questions about your:  Alcohol use.  Tobacco use.  Drug use.  Emotional  well-being.  Home and relationship well-being.  Sexual activity.  Eating habits.  Work and work Statistician.  Method of birth control.  Menstrual cycle.  Pregnancy history. Screening  You may have the following tests or measurements:  Height, weight, and BMI.  Blood pressure.  Lipid and cholesterol levels. These may be checked every 5 years, or more frequently if you are over 55 years old.  Skin check.  Lung cancer screening. You may have this screening every year starting at age 19 if you have a 30-pack-year history of smoking and currently smoke or have quit within the past 15 years.  Fecal occult blood test (FOBT) of the stool. You may have this test every year starting at age 41.  Flexible sigmoidoscopy or colonoscopy. You may have a sigmoidoscopy every 5 years or a colonoscopy every 10 years starting at age 3.  Hepatitis C blood test.  Hepatitis B blood test.  Sexually transmitted disease (STD) testing.  Diabetes screening. This is done by checking your blood sugar (glucose) after you have not eaten for a while (fasting). You may have this done every 1-3 years.  Mammogram. This may be done every 1-2 years. Talk to your health care provider about when you should start having regular mammograms. This may depend on whether you have a family history of breast cancer.  BRCA-related cancer screening. This may be done if you have a family history of breast, ovarian, tubal, or peritoneal cancers.  Pelvic exam and Pap test. This may be done every 3 years starting at age 18. Starting at age 31, this may be done every 5 years if you have a Pap test in combination with an HPV test.  Bone density  scan. This is done to screen for osteoporosis. You may have this scan if you are at high risk for osteoporosis. Discuss your test results, treatment options, and if necessary, the need for more tests with your health care provider. Vaccines  Your health care provider may recommend  certain vaccines, such as:  Influenza vaccine. This is recommended every year.  Tetanus, diphtheria, and acellular pertussis (Tdap, Td) vaccine. You may need a Td booster every 10 years.  Zoster vaccine. You may need this after age 67.  Pneumococcal 13-valent conjugate (PCV13) vaccine. You may need this if you have certain conditions and were not previously vaccinated.  Pneumococcal polysaccharide (PPSV23) vaccine. You may need one or two doses if you smoke cigarettes or if you have certain conditions. Talk to your health care provider about which screenings and vaccines you need and how often you need them. This information is not intended to replace advice given to you by your health care provider. Make sure you discuss any questions you have with your health care provider. Document Released: 07/24/2015 Document Revised: 03/16/2016 Document Reviewed: 04/28/2015 Elsevier Interactive Patient Education  2017 [place] Prevention in the Home Falls can cause injuries. They can happen to people of all ages. There are many things you can do to make your home safe and to help prevent falls. What can I do on the outside of my home?  Regularly fix the edges of walkways and driveways and fix any cracks.  Remove anything that might make you trip as you walk through a door, such as a raised step or threshold.  Trim any bushes or trees on the path to your home.  Use bright outdoor lighting.  Clear any walking paths of anything that might make someone trip, such as rocks or tools.  Regularly check to see if handrails are loose or broken. Make sure that both sides of any steps have handrails.  Any raised decks and porches should have guardrails on the edges.  Have any leaves, snow, or ice cleared regularly.  Use sand or salt on walking paths during winter.  Clean up any spills in your garage right away. This includes oil or grease spills. What can I do in the bathroom?  Use  night lights.  Install grab bars by the toilet and in the tub and shower. Do not use towel bars as grab bars.  Use non-skid mats or decals in the tub or shower.  If you need to sit down in the shower, use a plastic, non-slip stool.  Keep the floor dry. Clean up any water that spills on the floor as soon as it happens.  Remove soap buildup in the tub or shower regularly.  Attach bath mats securely with double-sided non-slip rug tape.  Do not have throw rugs and other things on the floor that can make you trip. What can I do in the bedroom?  Use night lights.  Make sure that you have a light by your bed that is easy to reach.  Do not use any sheets or blankets that are too big for your bed. They should not hang down onto the floor.  Have a firm chair that has side arms. You can use this for support while you get dressed.  Do not have throw rugs and other things on the floor that can make you trip. What can I do in the kitchen?  Clean up any spills right away.  Avoid walking on wet floors.  Keep items that you use a lot in easy-to-reach places.  If you need to reach something above you, use a strong step stool that has a grab bar.  Keep electrical cords out of the way.  Do not use floor polish or wax that makes floors slippery. If you must use wax, use non-skid floor wax.  Do not have throw rugs and other things on the floor that can make you trip. What can I do with my stairs?  Do not leave any items on the stairs.  Make sure that there are handrails on both sides of the stairs and use them. Fix handrails that are broken or loose. Make sure that handrails are as long as the stairways.  Check any carpeting to make sure that it is firmly attached to the stairs. Fix any carpet that is loose or worn.  Avoid having throw rugs at the top or bottom of the stairs. If you do have throw rugs, attach them to the floor with carpet tape.  Make sure that you have a light switch at  the top of the stairs and the bottom of the stairs. If you do not have them, ask someone to add them for you. What else can I do to help prevent falls?  Wear shoes that:  Do not have high heels.  Have rubber bottoms.  Are comfortable and fit you well.  Are closed at the toe. Do not wear sandals.  If you use a stepladder:  Make sure that it is fully opened. Do not climb a closed stepladder.  Make sure that both sides of the stepladder are locked into place.  Ask someone to hold it for you, if possible.  Clearly mark and make sure that you can see:  Any grab bars or handrails.  First and last steps.  Where the edge of each step is.  Use tools that help you move around (mobility aids) if they are needed. These include:  Canes.  Walkers.  Scooters.  Crutches.  Turn on the lights when you go into a dark area. Replace any light bulbs as soon as they burn out.  Set up your furniture so you have a clear path. Avoid moving your furniture around.  If any of your floors are uneven, fix them.  If there are any pets around you, be aware of where they are.  Review your medicines with your doctor. Some medicines can make you feel dizzy. This can increase your chance of falling. Ask your doctor what other things that you can do to help prevent falls. This information is not intended to replace advice given to you by your health care provider. Make sure you discuss any questions you have with your health care provider. Document Released: 04/23/2009 Document Revised: 12/03/2015 Document Reviewed: 08/01/2014 Elsevier Interactive Patient Education  2017 [employer].

## 2019-06-27 NOTE — Patient Instructions (Signed)

## 2019-06-28 ENCOUNTER — Telehealth: Payer: Self-pay

## 2019-06-28 LAB — LIPID PANEL
Chol/HDL Ratio: 2.1 ratio (ref 0.0–4.4)
Cholesterol, Total: 212 mg/dL — ABNORMAL HIGH (ref 100–199)
HDL: 100 mg/dL (ref >39)
LDL Chol Calc (NIH): 103 mg/dL — ABNORMAL HIGH (ref 0–99)
Triglycerides: 48 mg/dL (ref 0–149)
VLDL Cholesterol Cal: 9 mg/dL (ref 5–40)

## 2019-07-02 LAB — HM MAMMOGRAPHY

## 2019-07-03 ENCOUNTER — Encounter: Payer: Self-pay | Admitting: Nurse Practitioner

## 2019-07-05 ENCOUNTER — Other Ambulatory Visit: Payer: Self-pay | Admitting: Internal Medicine

## 2019-07-07 NOTE — Progress Notes (Signed)
This visit occurred during the SARS-CoV-2 public health emergency.  Safety protocols were in place, including screening questions prior to the visit, additional usage of staff PPE, and extensive cleaning of exam room while observing appropriate contact time as indicated for disinfecting solutions.  Subjective:     Patient ID: Mary Neal , female     DOB: 11/04/1965 , 53 y.o.   MRN: [id number]   Chief Complaint  Patient presents with  . Gastroesophageal Reflux    HPI  She is accompanied by her Mother during her visit. She reports her sx have improved with use of PPI. She reports compliance with meds. She has not had any further episodes of chest pain.   Gastroesophageal Reflux She complains of belching and heartburn. This is a recurrent problem. The current episode started more than 1 month ago. She has tried a PPI for the symptoms. The treatment provided moderate relief.     Past Medical History:  Diagnosis Date  . Abnormal Pap smear 2013  . Dislocation of right hip ([place]) 06/09/2018   Per MRI  . History of knee replacement 2005  . Spina bifida ([place])   . Uterine prolapse   . Wears glasses      Family History  Problem Relation Age of Onset  . Hypertension Mother   . Hyperlipidemia Mother   . Heart attack Brother      Current Outpatient Medications:  .  Cholecalciferol 1000 UNITS capsule, Take 500 Units by mouth daily., Disp: , Rfl:  .  famotidine (PEPCID) 20 MG tablet, TAKE 1 TABLET BY MOUTH EVERY DAY, Disp: 30 tablet, Rfl: 1 .  Multiple Vitamin (MULTI-VITAMINS) TABS, Take by mouth., Disp: , Rfl:  .  omeprazole (PRILOSEC) 40 MG capsule, TAKE 1 CAPSULE BY MOUTH EVERY DAY, Disp: 90 capsule, Rfl: 1 .  oxybutynin (DITROPAN XL) 15 MG 24 hr tablet, Take 15 mg by mouth 2 (two) times daily. , Disp: , Rfl:  .  polyethylene glycol (MIRALAX) packet, Take 17 g by mouth daily as needed for moderate constipation or severe constipation. (Patient not taking: Reported on  06/27/2019), Disp: 14 each, Rfl: 0 .  senna (SENOKOT) 8.6 MG TABS tablet, Take 1 tablet by mouth. Takes 1 tab every other day, Disp: , Rfl:  .  vitamin C (ASCORBIC ACID) 500 MG tablet, Take by mouth., Disp: , Rfl:    Allergies  Allergen Reactions  . Avocado Other (See Comments)  . Banana Other (See Comments)  . Latex Other (See Comments)  . Penicillins Other (See Comments)     Review of Systems  Constitutional: Negative.   Respiratory: Negative.   Gastrointestinal: Positive for heartburn.  Neurological: Negative.   Psychiatric/Behavioral: Negative.      Today's Vitals   06/27/19 1247  BP: (!) 150/80  Pulse: 96  Temp: 98.3 F (36.8 C)  TempSrc: Oral  Weight: 112 lb 6.4 oz (51 kg)  Height: 4\' 9"  (1.448 m)   Body mass index is 24.32 kg/m.   Objective:  Physical Exam Vitals and nursing note reviewed.  Constitutional:      Appearance: Normal appearance.  HENT:     Head: Normocephalic and atraumatic.  Cardiovascular:     Rate and Rhythm: Normal rate and regular rhythm.     Heart sounds: Normal heart sounds.  Pulmonary:     Effort: Pulmonary effort is normal.     Breath sounds: Normal breath sounds.  Abdominal:     Palpations: Abdomen is soft.  Tenderness: There is no abdominal tenderness.  Skin:    General: Skin is warm.  Neurological:     Mental Status: She is alert.  Psychiatric:        Mood and Affect: Mood normal.        Behavior: Behavior normal.         Assessment And Plan:     1. Gastroesophageal reflux disease without esophagitis  Chronic, improved. She will continue with current meds for now.   2. Elevated blood pressure reading  She is encouraged to follow a low-sodium diet. Both the patient and her Mother verbalize understanding of treatment goals. She will rto in 2-3 months for re-evaluation. I will also check lipid panel.   3. Drug therapy  - Lipid panel      Maximino Greenland, MD    THE PATIENT IS ENCOURAGED TO PRACTICE SOCIAL  DISTANCING DUE TO THE COVID-19 PANDEMIC.

## 2019-07-12 DIAGNOSIS — K219 Gastro-esophageal reflux disease without esophagitis: Secondary | ICD-10-CM | POA: Insufficient documentation

## 2019-08-06 ENCOUNTER — Other Ambulatory Visit: Payer: Self-pay | Admitting: Internal Medicine

## 2019-08-08 ENCOUNTER — Ambulatory Visit: Payer: Medicare Other | Admitting: Internal Medicine

## 2019-08-19 ENCOUNTER — Telehealth: Payer: Self-pay

## 2019-08-21 ENCOUNTER — Encounter: Payer: Self-pay | Admitting: Internal Medicine

## 2019-08-21 ENCOUNTER — Ambulatory Visit (INDEPENDENT_AMBULATORY_CARE_PROVIDER_SITE_OTHER): Payer: Medicare Other | Admitting: Internal Medicine

## 2019-08-21 ENCOUNTER — Other Ambulatory Visit: Payer: Self-pay

## 2019-08-21 VITALS — BP 154/80 | HR 84 | Temp 97.8°F | Ht <= 58 in | Wt 114.3 lb

## 2019-08-21 DIAGNOSIS — G8220 Paraplegia, unspecified: Secondary | ICD-10-CM

## 2019-08-21 DIAGNOSIS — I10 Essential (primary) hypertension: Secondary | ICD-10-CM | POA: Diagnosis not present

## 2019-08-21 DIAGNOSIS — Q059 Spina bifida, unspecified: Secondary | ICD-10-CM | POA: Diagnosis not present

## 2019-08-21 MED ORDER — VALSARTAN 80 MG PO TABS: 80.0000 mg | ORAL_TABLET | Freq: Every day | ORAL | 1 refills | Status: DC

## 2019-08-21 NOTE — Patient Instructions (Signed)
DASH Eating Plan DASH stands for "Dietary Approaches to Stop Hypertension." The DASH eating plan is a healthy eating plan that has been shown to reduce high blood pressure (hypertension). It may also reduce your risk for type 2 diabetes, heart disease, and stroke. The DASH eating plan may also help with weight loss. What are tips for following this plan?  General guidelines  Avoid eating more than 2,300 mg (milligrams) of salt (sodium) a day. If you have hypertension, you may need to reduce your sodium intake to 1,500 mg a day.  Limit alcohol intake to no more than 1 drink a day for nonpregnant women and 2 drinks a day for men. One drink equals 12 oz of beer, 5 oz of wine, or 1 oz of hard liquor.  Work with your health care provider to maintain a healthy body weight or to lose weight. Ask what an ideal weight is for you.  Get at least 30 minutes of exercise that causes your heart to beat faster (aerobic exercise) most days of the week. Activities may include walking, swimming, or biking.  Work with your health care provider or diet and nutrition specialist (dietitian) to adjust your eating plan to your individual calorie needs. Reading food labels   Check food labels for the amount of sodium per serving. Choose foods with less than 5 percent of the Daily Value of sodium. Generally, foods with less than 300 mg of sodium per serving fit into this eating plan.  To find whole grains, look for the word "whole" as the first word in the ingredient list. Shopping  Buy products labeled as "low-sodium" or "no salt added."  Buy fresh foods. Avoid canned foods and premade or frozen meals. Cooking  Avoid adding salt when cooking. Use salt-free seasonings or herbs instead of table salt or sea salt. Check with your health care provider or pharmacist before using salt substitutes.  Do not fry foods. Cook foods using healthy methods such as baking, boiling, grilling, and broiling instead.  Cook with  heart-healthy oils, such as olive, canola, soybean, or sunflower oil. Meal planning  Eat a balanced diet that includes: ? 5 or more servings of fruits and vegetables each day. At each meal, try to fill half of your plate with fruits and vegetables. ? Up to 6-8 servings of whole grains each day. ? Less than 6 oz of lean meat, poultry, or fish each day. A 3-oz serving of meat is about the same size as a deck of cards. One egg equals 1 oz. ? 2 servings of low-fat dairy each day. ? A serving of nuts, seeds, or beans 5 times each week. ? Heart-healthy fats. Healthy fats called Omega-3 fatty acids are found in foods such as flaxseeds and [place] fish, like sardines, salmon, and mackerel.  Limit how much you eat of the following: ? Canned or prepackaged foods. ? Food that is high in trans fat, such as fried foods. ? Food that is high in saturated fat, such as fatty meat. ? Sweets, desserts, sugary drinks, and other foods with added sugar. ? Full-fat dairy products.  Do not salt foods before eating.  Try to eat at least 2 vegetarian meals each week.  Eat more home-cooked food and less restaurant, buffet, and fast food.  When eating at a restaurant, ask that your food be prepared with less salt or no salt, if possible. What foods are recommended? The items listed may not be a complete list. Talk with your dietitian about   what dietary choices are best for you. Grains Whole-grain or whole-wheat bread. Whole-grain or whole-wheat pasta. Brown rice. Oatmeal. Quinoa. Bulgur. Whole-grain and low-sodium cereals. Pita bread. Low-fat, low-sodium crackers. Whole-wheat flour tortillas. Vegetables Fresh or frozen vegetables (raw, steamed, roasted, or grilled). Low-sodium or reduced-sodium tomato and vegetable juice. Low-sodium or reduced-sodium tomato sauce and tomato paste. Low-sodium or reduced-sodium canned vegetables. Fruits All fresh, dried, or frozen fruit. Canned fruit in natural juice (without  added sugar). Meat and other protein foods Skinless chicken or turkey. Ground chicken or turkey. Pork with fat trimmed off. Fish and seafood. Egg whites. Dried beans, peas, or lentils. Unsalted nuts, nut butters, and seeds. Unsalted canned beans. Lean cuts of beef with fat trimmed off. Low-sodium, lean deli meat. Dairy Low-fat (1%) or fat-free (skim) milk. Fat-free, low-fat, or reduced-fat cheeses. Nonfat, low-sodium ricotta or cottage cheese. Low-fat or nonfat yogurt. Low-fat, low-sodium cheese. Fats and oils Soft margarine without trans fats. Vegetable oil. Low-fat, reduced-fat, or light mayonnaise and salad dressings (reduced-sodium). Canola, safflower, olive, soybean, and sunflower oils. Avocado. Seasoning and other foods Herbs. Spices. Seasoning mixes without salt. Unsalted popcorn and pretzels. Fat-free sweets. What foods are not recommended? The items listed may not be a complete list. Talk with your dietitian about what dietary choices are best for you. Grains Baked goods made with fat, such as croissants, muffins, or some breads. Dry pasta or rice meal packs. Vegetables Creamed or fried vegetables. Vegetables in a cheese sauce. Regular canned vegetables (not low-sodium or reduced-sodium). Regular canned tomato sauce and paste (not low-sodium or reduced-sodium). Regular tomato and vegetable juice (not low-sodium or reduced-sodium). Pickles. Olives. Fruits Canned fruit in a light or heavy syrup. Fried fruit. Fruit in cream or butter sauce. Meat and other protein foods Fatty cuts of meat. Ribs. Fried meat. Bacon. Sausage. Bologna and other processed lunch meats. Salami. Fatback. Hotdogs. Bratwurst. Salted nuts and seeds. Canned beans with added salt. Canned or smoked fish. Whole eggs or egg yolks. Chicken or turkey with skin. Dairy Whole or 2% milk, cream, and half-and-half. Whole or full-fat cream cheese. Whole-fat or sweetened yogurt. Full-fat cheese. Nondairy creamers. Whipped toppings.  Processed cheese and cheese spreads. Fats and oils Butter. Stick margarine. Lard. Shortening. Ghee. Bacon fat. Tropical oils, such as coconut, palm kernel, or palm oil. Seasoning and other foods Salted popcorn and pretzels. Onion salt, garlic salt, seasoned salt, table salt, and sea salt. Worcestershire sauce. Tartar sauce. Barbecue sauce. Teriyaki sauce. Soy sauce, including reduced-sodium. Steak sauce. Canned and packaged gravies. Fish sauce. Oyster sauce. Cocktail sauce. Horseradish that you find on the shelf. Ketchup. Mustard. Meat flavorings and tenderizers. Bouillon cubes. Hot sauce and Tabasco sauce. Premade or packaged marinades. Premade or packaged taco seasonings. Relishes. Regular salad dressings. Where to find more information:  National Heart, Lung, and Blood Institute: www.nhlbi.nih.gov  American Heart Association: www.heart.org Summary  The DASH eating plan is a healthy eating plan that has been shown to reduce high blood pressure (hypertension). It may also reduce your risk for type 2 diabetes, heart disease, and stroke.  With the DASH eating plan, you should limit salt (sodium) intake to 2,300 mg a day. If you have hypertension, you may need to reduce your sodium intake to 1,500 mg a day.  When on the DASH eating plan, aim to eat more fresh fruits and vegetables, whole grains, lean proteins, low-fat dairy, and heart-healthy fats.  Work with your health care provider or diet and nutrition specialist (dietitian) to adjust your eating plan to your   individual calorie needs. This information is not intended to replace advice given to you by your health care provider. Make sure you discuss any questions you have with your health care provider. Document Revised: 06/09/2017 Document Reviewed: 06/20/2016 Elsevier Patient Education  2020 [place] refers to food and lifestyle choices that are based on the traditions of countries located  on the [employer]. This way of eating has been shown to help prevent certain conditions and improve outcomes for people who have chronic diseases, like kidney disease and heart disease. What are tips for following this plan? Lifestyle  Cook and eat meals together with your family, when possible.  Drink enough fluid to keep your urine clear or pale yellow.  Be physically active every day. This includes: ? Aerobic exercise like running or swimming. ? Leisure activities like gardening, walking, or housework.  Get 7-8 hours of sleep each night.  If recommended by your health care provider, drink red wine in moderation. This means 1 glass a day for nonpregnant women and 2 glasses a day for men. A glass of wine equals 5 oz (150 mL). Reading food labels   Check the serving size of packaged foods. For foods such as rice and pasta, the serving size refers to the amount of cooked product, not dry.  Check the total fat in packaged foods. Avoid foods that have saturated fat or trans fats.  Check the ingredients list for added sugars, such as corn syrup. Shopping  At the grocery store, buy most of your food from the areas near the walls of the store. This includes: ? Fresh fruits and vegetables (produce). ? Grains, beans, nuts, and seeds. Some of these may be available in unpackaged forms or large amounts (in bulk). ? Fresh seafood. ? Poultry and eggs. ? Low-fat dairy products.  Buy whole ingredients instead of prepackaged foods.  Buy fresh fruits and vegetables in-season from local farmers markets.  Buy frozen fruits and vegetables in resealable bags.  If you do not have access to quality fresh seafood, buy precooked frozen shrimp or canned fish, such as tuna, salmon, or sardines.  Buy small amounts of raw or cooked vegetables, salads, or olives from the deli or salad bar at your store.  Stock your pantry so you always have certain foods on hand, such as olive oil, canned tuna,  canned tomatoes, rice, pasta, and beans. Cooking  Cook foods with extra-virgin olive oil instead of using butter or other vegetable oils.  Have meat as a side dish, and have vegetables or grains as your main dish. This means having meat in small portions or adding small amounts of meat to foods like pasta or stew.  Use beans or vegetables instead of meat in common dishes like chili or lasagna.  Experiment with different cooking methods. Try roasting or broiling vegetables instead of steaming or sauteing them.  Add frozen vegetables to soups, stews, pasta, or rice.  Add nuts or seeds for added healthy fat at each meal. You can add these to yogurt, salads, or vegetable dishes.  Marinate fish or vegetables using olive oil, lemon juice, garlic, and fresh herbs. Meal planning   Plan to eat 1 vegetarian meal one day each week. Try to work up to 2 vegetarian meals, if possible.  Eat seafood 2 or more times a week.  Have healthy snacks readily available, such as: ? Vegetable sticks with hummus. ? [place] yogurt. ? Fruit and nut trail mix.  Eat balanced meals throughout the week. This includes: ? Fruit: 2-3 servings a day ? Vegetables: 4-5 servings a day ? Low-fat dairy: 2 servings a day ? Fish, poultry, or lean meat: 1 serving a day ? Beans and legumes: 2 or more servings a week ? Nuts and seeds: 1-2 servings a day ? Whole grains: 6-8 servings a day ? Extra-virgin olive oil: 3-4 servings a day  Limit red meat and sweets to only a few servings a month What are my food choices?  Mediterranean diet ? Recommended  Grains: Whole-grain pasta. Brown rice. Bulgar wheat. Polenta. Couscous. Whole-wheat bread. [place].  Vegetables: Artichokes. Beets. Broccoli. Cabbage. Carrots. Eggplant. Green beans. Chard. Kale. Spinach. Onions. Leeks. Peas. Squash. Tomatoes. Peppers. Radishes.  Fruits: Apples. Apricots. Avocado. Berries. Bananas. Cherries. Dates. Figs. Grapes. Lemons. Melon.  Oranges. Peaches. Plums. Pomegranate.  Meats and other protein foods: Beans. Almonds. Sunflower seeds. Pine nuts. Peanuts. [place]. Salmon. Scallops. Shrimp. [place]. Tilapia. Clams. Oysters. Eggs.  Dairy: Low-fat milk. Cheese. Greek yogurt.  Beverages: Water. Red wine. Herbal tea.  Fats and oils: Extra virgin olive oil. Avocado oil. Grape seed oil.  Sweets and desserts: [place] yogurt with honey. Baked apples. Poached pears. Trail mix.  Seasoning and other foods: Basil. Cilantro. Coriander. Cumin. Mint. Parsley. Sage. Rosemary. Tarragon. Garlic. Oregano. Thyme. Pepper. Balsalmic vinegar. Tahini. Hummus. Tomato sauce. Olives. Mushrooms. ? Limit these  Grains: Prepackaged pasta or rice dishes. Prepackaged cereal with added sugar.  Vegetables: Deep fried potatoes (french fries).  Fruits: Fruit canned in syrup.  Meats and other protein foods: Beef. Pork. Lamb. Poultry with skin. Hot dogs. Bernie[place] Salines.  Dairy: Ice cream. Sour cream. Whole milk.  Beverages: Juice. Sugar-sweetened soft drinks. Beer. Liquor and spirits.  Fats and oils: Butter. Canola oil. Vegetable oil. Beef fat (tallow). Lard.  Sweets and desserts: Cookies. Cakes. Pies. Candy.  Seasoning and other foods: Mayonnaise. Premade sauces and marinades. The items listed may not be a complete list. Talk with your dietitian about what dietary choices are right for you. Summary  The Mediterranean diet includes both food and lifestyle choices.  Eat a variety of fresh fruits and vegetables, beans, nuts, seeds, and whole grains.  Limit the amount of red meat and sweets that you eat.  Talk with your health care provider about whether it is safe for you to drink red wine in moderation. This means 1 glass a day for nonpregnant women and 2 glasses a day for men. A glass of wine equals 5 oz (150 mL). This information is not intended to replace advice given to you by your health care provider. Make sure you discuss any questions you have with your  health care provider. Document Revised: 02/25/2016 Document Reviewed: 02/18/2016 Elsevier Patient Education  [place].

## 2019-08-23 ENCOUNTER — Ambulatory Visit (INDEPENDENT_AMBULATORY_CARE_PROVIDER_SITE_OTHER): Payer: Medicare Other

## 2019-08-23 ENCOUNTER — Telehealth: Payer: Medicare Other

## 2019-08-23 ENCOUNTER — Other Ambulatory Visit: Payer: Self-pay

## 2019-08-23 DIAGNOSIS — M1712 Unilateral primary osteoarthritis, left knee: Secondary | ICD-10-CM | POA: Diagnosis not present

## 2019-08-23 DIAGNOSIS — I10 Essential (primary) hypertension: Secondary | ICD-10-CM

## 2019-08-23 DIAGNOSIS — Q059 Spina bifida, unspecified: Secondary | ICD-10-CM

## 2019-08-23 DIAGNOSIS — Q6589 Other specified congenital deformities of hip: Secondary | ICD-10-CM

## 2019-08-23 DIAGNOSIS — K219 Gastro-esophageal reflux disease without esophagitis: Secondary | ICD-10-CM

## 2019-08-25 NOTE — Progress Notes (Signed)
This visit occurred during the SARS-CoV-2 public health emergency.  Safety protocols were in place, including screening questions prior to the visit, additional usage of staff PPE, and extensive cleaning of exam room while observing appropriate contact time as indicated for disinfecting solutions.  Subjective:     Patient ID: Mary Neal , female    DOB: [birth year] , 54 y.o.   MRN: [id number]   Chief Complaint  Patient presents with  . Hypertension    HPI  She is here today for f/u elevated blood pressure. She is accompanied by her Mother today.  She denies headaches, chest pain and shortness of breath. She denies using salt on her foods. Admits to limited activity - has paraparesis due to spina bifida. Ambulatory with crutches.   Hypertension This is a new problem. The current episode started more than 1 month ago. The problem is unchanged. Pertinent negatives include no blurred vision, chest pain, orthopnea or shortness of breath. Risk factors for coronary artery disease include family history.     Past Medical History:  Diagnosis Date  . Abnormal Pap smear 2013  . Dislocation of right hip ([place]) 06/09/2018   Per MRI  . History of knee replacement 2005  . Spina bifida ([place])   . Uterine prolapse   . Wears glasses      Family History  Problem Relation Age of Onset  . Hypertension Mother   . Hyperlipidemia Mother   . Heart attack Brother      Current Outpatient Medications:  .  Cholecalciferol 1000 UNITS capsule, Take 500 Units by mouth daily., Disp: , Rfl:  .  famotidine (PEPCID) 20 MG tablet, TAKE 1 TABLET BY MOUTH EVERY DAY, Disp: 30 tablet, Rfl: 1 .  Multiple Vitamin (MULTI-VITAMINS) TABS, Take by mouth., Disp: , Rfl:  .  omeprazole (PRILOSEC) 40 MG capsule, TAKE 1 CAPSULE BY MOUTH EVERY DAY, Disp: 90 capsule, Rfl: 1 .  oxybutynin (DITROPAN XL) 15 MG 24 hr tablet, Take 15 mg by mouth 2 (two) times daily. , Disp: , Rfl:  .  polyethylene glycol (MIRALAX) packet,  Take 17 g by mouth daily as needed for moderate constipation or severe constipation., Disp: 14 each, Rfl: 0 .  senna (SENOKOT) 8.6 MG TABS tablet, Take 1 tablet by mouth. Takes 1 tab every other day, Disp: , Rfl:  .  vitamin C (ASCORBIC ACID) 500 MG tablet, Take by mouth., Disp: , Rfl:  .  valsartan (DIOVAN) 80 MG tablet, Take 1 tablet (80 mg total) by mouth daily., Disp: 30 tablet, Rfl: 1   Allergies  Allergen Reactions  . Avocado Other (See Comments)  . Banana Other (See Comments)  . Latex Other (See Comments)  . Penicillins Other (See Comments)     Review of Systems  Constitutional: Negative.   Eyes: Negative for blurred vision.  Respiratory: Negative.  Negative for shortness of breath.   Cardiovascular: Negative.  Negative for chest pain and orthopnea.  Gastrointestinal: Negative.   Neurological: Negative.   Psychiatric/Behavioral: Negative.      Today's Vitals   08/21/19 1534  BP: (!) 154/80  Pulse: 84  Temp: 97.8 F (36.6 C)  TempSrc: Oral  Weight: 114 lb 4.8 oz (51.8 kg)  Height: 4\' 9"  (1.448 m)  PainSc: 0-No pain   Body mass index is 24.73 kg/m.   Objective:  Physical Exam Vitals and nursing note reviewed.  Constitutional:      Appearance: Normal appearance.  HENT:     Head: Normocephalic and atraumatic.  Cardiovascular:     Rate and Rhythm: Normal rate and regular rhythm.     Heart sounds: Normal heart sounds.  Pulmonary:     Effort: Pulmonary effort is normal.     Breath sounds: Normal breath sounds.  Skin:    General: Skin is warm.  Neurological:     General: No focal deficit present.     Mental Status: She is alert.  Psychiatric:        Mood and Affect: Mood normal.        Behavior: Behavior normal.         Assessment And Plan:     1. Essential hypertension, benign  Risks associated with untreated HTN was discussed with the patient. She agrees to start treatment. Her Mother is also present, and agrees with starting treatment. I will not  use diuretic d/t difficulty with ambulation, therefore making it difficult to get to restroom. I will start her on valsartan 80mg  daily. She will rto in four weeks for re-evaluation. Possible side effects including lightheadedness, headache, nausea were discussed with the patient. I will check renal function at her next visit.   2. Paraparesis (HCC)  Chronic.   3. Spina bifida without hydrocephalus, unspecified spinal region ([place])  Chronic, yet stable.  Maximino Greenland, MD    THE PATIENT IS ENCOURAGED TO PRACTICE SOCIAL DISTANCING DUE TO THE COVID-19 PANDEMIC.

## 2019-08-26 NOTE — Chronic Care Management (AMB) (Signed)
Chronic Care Management   Follow Up Note   08/23/2019 Name: Mary Neal MRN: [id number] DOB: [birth year]  Referred by: Glendale Chard, MD Reason for referral : Chronic Care Management (CCM RN Telephone Inbound Call  )   BRANAE WEISCHEDEL is a 54 y.o. year old female who is a primary care patient of Glendale Chard, MD. The CCM team was consulted for assistance with chronic disease management and care coordination needs.    Review of patient status, including review of consultants reports, relevant laboratory and other test results, and collaboration with appropriate care team members and the patient's provider was performed as part of comprehensive patient evaluation and provision of chronic care management services.    SDOH (Social Determinants of Health) screening performed today: None. See Care Plan for related entries.   Placed CCM RN CM outbound call to patient per her request to discuss her new dx of HTN.   Outpatient Encounter Medications as of 08/23/2019  Medication Sig  . Cholecalciferol 1000 UNITS capsule Take 500 Units by mouth daily.  . famotidine (PEPCID) 20 MG tablet TAKE 1 TABLET BY MOUTH EVERY DAY  . Multiple Vitamin (MULTI-VITAMINS) TABS Take by mouth.  Marland Kitchen omeprazole (PRILOSEC) 40 MG capsule TAKE 1 CAPSULE BY MOUTH EVERY DAY  . oxybutynin (DITROPAN XL) 15 MG 24 hr tablet Take 15 mg by mouth 2 (two) times daily.   . polyethylene glycol (MIRALAX) packet Take 17 g by mouth daily as needed for moderate constipation or severe constipation.  . senna (SENOKOT) 8.6 MG TABS tablet Take 1 tablet by mouth. Takes 1 tab every other day  . valsartan (DIOVAN) 80 MG tablet Take 1 tablet (80 mg total) by mouth daily.  . vitamin C (ASCORBIC ACID) 500 MG tablet Take by mouth.   No facility-administered encounter medications on file as of 08/23/2019.     Objective:  No results found for: HGBA1C Lab Results  Component Value Date   LDLCALC 103 (H) 06/27/2019   CREATININE 0.68  02/28/2019   BP Readings from Last 3 Encounters:  08/21/19 (!) 154/80  06/27/19 (!) 150/80  06/27/19 (!) 150/80    Goals Addressed    . "to get her BP under good control"       Mother stated  Current Barriers:  Marland Kitchen Knowledge Deficits related to disease process and Self Health management of HTN . Chronic Disease Management support and education needs related to HTN, Spina bifida w/o hydrocephalus, GERD, Osteoarthritis of left knee, Congential dysplasia of right hip  Nurse Case Manager Clinical Goal(s):  Marland Kitchen Over the next 90 days, patient will work with CCM RN and PCP  to address needs related to disease education and support for improved Self Health management of HTN  Interventions:  . Evaluation of current treatment plan related to HTN and patient's adherence to plan as established by provider. . Provided education to patient re: basic disease process of HTN; educated on importance of adhering to prescribed diet and medication regimen; implementing exercise into her daily/weekly routine and restricting Sodium   . Reviewed medications with patient and discussed newly started Valsartan 80 mg daily; Educated on potential for SE such as dizziness and low BP but most likely will improve over time; encouraged mother to instruct patient to change positions slowly to allow time for BP to adjust and to monitor BP daily and record for discussion with CCM RN and PCP to help determine if med/dosage is effective . Discussed plans with patient for ongoing care management  follow up and provided patient with direct contact information for care management team . Advised patient, providing education and rationale, to monitor blood pressure daily and record, calling CCM RN CM and or PCP for findings outside established parameters.  . Provided patient with printed  educational materials related to What is High Blood Pressure?; African Americans and High Blood Pressure; Why Should I lower Sodium?; High Blood Pressure  and Strokes; Life's Simple 7; Blood Pressure log  Patient Self Care Activities:  . Self administers medications as prescribed . Attends all scheduled provider appointments . Calls pharmacy for medication refills . Performs ADL's independently . Performs IADL's independently . Calls provider office for new concerns or questions  Initial goal documentation         Plan:   Telephone follow up appointment with care management team member scheduled for: 09/26/19  Barb Merino, RN, BSN, CCM Care Management Coordinator [facility]  Direct Phone: [phone]

## 2019-08-26 NOTE — Patient Instructions (Signed)
Visit Information  Goals Addressed    . "to get her BP under good control"       Mother stated  Current Barriers:  Marland Kitchen Knowledge Deficits related to disease process and Self Health management of HTN . Chronic Disease Management support and education needs related to HTN, Spina bifida w/o hydrocephalus, GERD, Osteoarthritis of left knee, Congential dysplasia of right hip  Nurse Case Manager Clinical Goal(s):  Marland Kitchen Over the next 90 days, patient will work with CCM RN and PCP  to address needs related to disease education and support for improved Self Health management of HTN  Interventions:  . Evaluation of current treatment plan related to HTN and patient's adherence to plan as established by provider. . Provided education to patient re: basic disease process of HTN; educated on importance of adhering to prescribed diet and medication regimen; implementing exercise into her daily/weekly routine and restricting Sodium   . Reviewed medications with patient and discussed newly started Valsartan 80 mg daily; Educated on potential for SE such as dizziness and low BP but most likely will improve over time; encouraged mother to instruct patient to change positions slowly to allow time for BP to adjust and to monitor BP daily and record for discussion with CCM RN and PCP to help determine if med/dosage is effective . Discussed plans with patient for ongoing care management follow up and provided patient with direct contact information for care management team . Advised patient, providing education and rationale, to monitor blood pressure daily and record, calling CCM RN CM and or PCP for findings outside established parameters.  . Provided patient with printed  educational materials related to What is High Blood Pressure?; African Americans and High Blood Pressure; Why Should I lower Sodium?; High Blood Pressure and Strokes; Life's Simple 7; Blood Pressure log  Patient Self Care Activities:  . Self  administers medications as prescribed . Attends all scheduled provider appointments . Calls pharmacy for medication refills . Performs ADL's independently . Performs IADL's independently . Calls provider office for new concerns or questions  Initial goal documentation        The patient verbalized understanding of instructions provided today and declined a print copy of patient instruction materials.   Telephone follow up appointment with care management team member scheduled for: 09/26/19  Barb Merino, RN, BSN, CCM Care Management Coordinator [place]  Direct Phone: [phone]

## 2019-08-29 ENCOUNTER — Other Ambulatory Visit: Payer: Self-pay | Admitting: Internal Medicine

## 2019-09-12 ENCOUNTER — Other Ambulatory Visit: Payer: Self-pay | Admitting: Internal Medicine

## 2019-09-19 ENCOUNTER — Encounter: Payer: Self-pay | Admitting: Internal Medicine

## 2019-09-19 ENCOUNTER — Other Ambulatory Visit: Payer: Self-pay

## 2019-09-19 ENCOUNTER — Ambulatory Visit (INDEPENDENT_AMBULATORY_CARE_PROVIDER_SITE_OTHER): Payer: Medicare Other | Admitting: Internal Medicine

## 2019-09-19 VITALS — BP 158/86 | HR 104 | Temp 98.5°F | Ht <= 58 in | Wt 110.0 lb

## 2019-09-19 DIAGNOSIS — Z79899 Other long term (current) drug therapy: Secondary | ICD-10-CM | POA: Diagnosis not present

## 2019-09-19 DIAGNOSIS — I10 Essential (primary) hypertension: Secondary | ICD-10-CM | POA: Diagnosis not present

## 2019-09-19 NOTE — Progress Notes (Signed)
This visit occurred during the SARS-CoV-2 public health emergency.  Safety protocols were in place, including screening questions prior to the visit, additional usage of staff PPE, and extensive cleaning of exam room while observing appropriate contact time as indicated for disinfecting solutions.  Subjective:     Patient ID: Mary Neal , female    DOB: 08/08/1965 , 53 y.o.   MRN: [id number]   Chief Complaint  Patient presents with  . Hypertension    HPI  She presents today, accompanied by her Mother, for BP check.  She was started on valsartan 80mg at her last visit. She has not had any issues with the medication.     Past Medical History:  Diagnosis Date  . Abnormal Pap smear 2013  . Dislocation of right hip (HCC) 06/09/2018   Per MRI  . History of knee replacement 2005  . Spina bifida (HCC)   . Uterine prolapse   . Wears glasses      Family History  Problem Relation Age of Onset  . Hypertension Mother   . Hyperlipidemia Mother   . Heart attack Brother      Current Outpatient Medications:  .  famotidine (PEPCID) 20 MG tablet, TAKE 1 TABLET BY MOUTH EVERY DAY, Disp: 30 tablet, Rfl: 1 .  Multiple Vitamin (MULTI-VITAMINS) TABS, Take by mouth., Disp: , Rfl:  .  omeprazole (PRILOSEC) 40 MG capsule, TAKE 1 CAPSULE BY MOUTH EVERY DAY, Disp: 90 capsule, Rfl: 1 .  oxybutynin (DITROPAN XL) 15 MG 24 hr tablet, Take 15 mg by mouth 2 (two) times daily. , Disp: , Rfl:  .  polyethylene glycol (MIRALAX) packet, Take 17 g by mouth daily as needed for moderate constipation or severe constipation., Disp: 14 each, Rfl: 0 .  senna (SENOKOT) 8.6 MG TABS tablet, Take 1 tablet by mouth. Takes 1 tab every other day, Disp: , Rfl:  .  valsartan (DIOVAN) 80 MG tablet, TAKE 1 TABLET BY MOUTH EVERY DAY, Disp: 30 tablet, Rfl: 1 .  vitamin C (ASCORBIC ACID) 500 MG tablet, Take by mouth., Disp: , Rfl:  .  Cholecalciferol 1000 UNITS capsule, Take 500 Units by mouth daily., Disp: , Rfl:     Allergies  Allergen Reactions  . Avocado Other (See Comments)  . Banana Other (See Comments)  . Latex Other (See Comments)  . Penicillins Other (See Comments)     Review of Systems  Constitutional: Negative.   Respiratory: Negative.   Cardiovascular: Negative.   Gastrointestinal: Negative.   Neurological: Negative.   Psychiatric/Behavioral: Negative.      Today's Vitals   09/19/19 1455 09/19/19 1504 09/19/19 1514  BP: (!) 156/90 (!) 164/80 (!) 158/86  Pulse: (!) 104 (!) 104   Temp: 98.5 F (36.9 C)    TempSrc: Oral    Weight: 110 lb (49.9 kg)    Height: 4' 9" (1.448 m)     Body mass index is 23.8 kg/m.   Objective:  Physical Exam Vitals and nursing note reviewed.  Constitutional:      Appearance: Normal appearance.  HENT:     Head: Normocephalic and atraumatic.  Cardiovascular:     Rate and Rhythm: Normal rate and regular rhythm.     Heart sounds: Normal heart sounds.  Pulmonary:     Effort: Pulmonary effort is normal.     Breath sounds: Normal breath sounds.  Skin:    General: Skin is warm.  Neurological:     General: No focal deficit present.       Mental Status: She is alert.  Psychiatric:        Mood and Affect: Mood normal.        Behavior: Behavior normal.         Assessment And Plan:     1. Essential hypertension, benign  Uncontrolled. I will increase her valsartan to 147m once daily. She will take TWO 860mtabs until she runs out. She agrees to rto in four weeks for re-evaluation. She is encouraged to cut back on intake of packaged foods which tend to be high in sodium. I will check renal function today. Pt advised that I will need to re-check renal function at her next visit. She is also encouraged to stay well hydrated.   2. Drug therapy  - BMP8+EGFR  RoMaximino GreenlandMD    THE PATIENT IS ENCOURAGED TO PRACTICE SOCIAL DISTANCING DUE TO THE COVID-19 PANDEMIC.

## 2019-09-19 NOTE — Patient Instructions (Signed)
Please increase Valsartan 80mg  - Take TWO tabs once daily until you run out  Do not add SALT to foods  I will send new prescription for Valsartan 160mg  once daily to the pharmacy

## 2019-09-20 LAB — BMP8+EGFR
BUN/Creatinine Ratio: 23 (ref 9–23)
BUN: 16 mg/dL (ref 6–24)
CO2: 22 mmol/L (ref 20–29)
Calcium: 9.2 mg/dL (ref 8.7–10.2)
Chloride: 107 mmol/L — ABNORMAL HIGH (ref 96–106)
Creatinine, Ser: 0.69 mg/dL (ref 0.57–1.00)
GFR calc Af Amer: 114 mL/min/{1.73_m2} (ref >59)
GFR calc non Af Amer: 99 mL/min/{1.73_m2} (ref >59)
Glucose: 108 mg/dL — ABNORMAL HIGH (ref 65–99)
Potassium: 4 mmol/L (ref 3.5–5.2)
Sodium: 142 mmol/L (ref 134–144)

## 2019-09-26 ENCOUNTER — Other Ambulatory Visit: Payer: Self-pay | Admitting: Internal Medicine

## 2019-09-26 ENCOUNTER — Telehealth: Payer: Self-pay

## 2019-10-01 ENCOUNTER — Other Ambulatory Visit: Payer: Self-pay

## 2019-10-01 ENCOUNTER — Other Ambulatory Visit: Payer: Medicare Other

## 2019-10-01 ENCOUNTER — Ambulatory Visit: Payer: Medicare Other

## 2019-10-01 VITALS — BP 138/84 | HR 85 | Temp 98.4°F | Ht <= 58 in | Wt 110.0 lb

## 2019-10-01 DIAGNOSIS — Z79899 Other long term (current) drug therapy: Secondary | ICD-10-CM

## 2019-10-01 DIAGNOSIS — I10 Essential (primary) hypertension: Secondary | ICD-10-CM

## 2019-10-01 NOTE — Progress Notes (Signed)
Pt presents today for b/p check 138/84 currently valsartan 80MG  (2tabs a day) provider aware

## 2019-10-02 ENCOUNTER — Telehealth: Payer: Self-pay

## 2019-10-02 LAB — BMP8+EGFR
BUN/Creatinine Ratio: 32 — ABNORMAL HIGH (ref 9–23)
BUN: 19 mg/dL (ref 6–24)
CO2: 21 mmol/L (ref 20–29)
Calcium: 9.2 mg/dL (ref 8.7–10.2)
Chloride: 106 mmol/L (ref 96–106)
Creatinine, Ser: 0.59 mg/dL (ref 0.57–1.00)
GFR calc Af Amer: 120 mL/min/{1.73_m2} (ref >59)
GFR calc non Af Amer: 104 mL/min/{1.73_m2} (ref >59)
Glucose: 106 mg/dL — ABNORMAL HIGH (ref 65–99)
Potassium: 4.1 mmol/L (ref 3.5–5.2)
Sodium: 141 mmol/L (ref 134–144)

## 2019-10-02 NOTE — Telephone Encounter (Signed)
I left a message on [phone] that I called the call back number [phone] the voicemail isn't setup so I was unable to leave a message.  I was calling [name] back because she said that she wanted to make sure that the pt's medications are right.

## 2019-10-03 ENCOUNTER — Other Ambulatory Visit: Payer: Self-pay

## 2019-10-03 MED ORDER — VALSARTAN 160 MG PO TABS: 160.0000 mg | ORAL_TABLET | Freq: Every day | ORAL | 1 refills | Status: DC

## 2019-10-24 ENCOUNTER — Ambulatory Visit: Payer: Self-pay

## 2019-10-24 ENCOUNTER — Other Ambulatory Visit: Payer: Self-pay | Admitting: Internal Medicine

## 2019-10-24 DIAGNOSIS — I10 Essential (primary) hypertension: Secondary | ICD-10-CM

## 2019-10-24 NOTE — Chronic Care Management (AMB) (Signed)
  Chronic Care Management    Social Work CCM Outreach Note  10/24/2019 Name: Mary Neal MRN: [id number] DOB: 24-Apr-1966  Mary Neal is a 54 y.o. year old female who is a primary care patient of Glendale Chard, MD . The CCM team was consulted for assistance with care coordination.    SW placed a successful outbound call to the patients mother and caregiver Mary Neal. No acute SW needs identified at this time.    Follow Up Plan: Appointment scheduled for RN Care Manager follow up on 11/26/19.  Daneen Schick, BSW, CDP Social Worker, Certified Dementia Practitioner [place] [facility] [phone]

## 2019-11-26 ENCOUNTER — Telehealth: Payer: Medicare Other

## 2019-11-26 ENCOUNTER — Ambulatory Visit (INDEPENDENT_AMBULATORY_CARE_PROVIDER_SITE_OTHER): Payer: Medicare Other

## 2019-11-26 ENCOUNTER — Other Ambulatory Visit: Payer: Self-pay

## 2019-11-26 DIAGNOSIS — Q059 Spina bifida, unspecified: Secondary | ICD-10-CM

## 2019-11-26 DIAGNOSIS — Q6589 Other specified congenital deformities of hip: Secondary | ICD-10-CM

## 2019-11-26 DIAGNOSIS — I10 Essential (primary) hypertension: Secondary | ICD-10-CM | POA: Diagnosis not present

## 2019-11-26 DIAGNOSIS — K219 Gastro-esophageal reflux disease without esophagitis: Secondary | ICD-10-CM

## 2019-11-27 NOTE — Patient Instructions (Signed)
Visit Information  Goals Addressed            This Visit's Progress   . "to get her BP under good control"       Mother stated  Current Barriers:  Marland Kitchen Knowledge Deficits related to disease process and Self Health management of HTN . Chronic Disease Management support and education needs related to HTN, Spina bifida w/o hydrocephalus, GERD, Osteoarthritis of left knee, Congential dysplasia of right hip  Nurse Case Manager Clinical Goal(s):  Marland Kitchen Over the next 90 days, patient will work with CCM RN and PCP  to address needs related to disease education and support for improved Self Health management of HTN  CCM RN CM Interventions:  11/26/19 call completed with mother [name] . Evaluation of current treatment plan related to HTN and patient's adherence to plan as established by provider. . Provided education to patient re: basic disease process of HTN; educated on importance of adhering to prescribed diet and medication regimen; implementing exercise into her daily/weekly routine and restricting Sodium: Determined patient and mom are checking patient's BP daily with good results; 105/63, 113/63; 110/58  . Reviewed medications with patient and discussed patient is adhering to taking Valsartan 160 mg qd as directed by PCP, denies noted SE or missed doses . Discussed plans with patient for ongoing care management follow up and provided patient with direct contact information for care management team . Advised patient, providing education and rationale, to monitor blood pressure daily and record, calling CCM RN CM and or PCP for findings outside established parameters.  . Reviewed scheduled/upcoming provider appointments including: next scheduled OV for physical is scheduled with Dr. Baird Cancer for 01/07/20 @11 :15 AM  Patient Self Care Activities:  . Self administers medications as prescribed . Attends all scheduled provider appointments . Calls pharmacy for medication refills . Performs ADL's  independently . Performs IADL's independently . Calls provider office for new concerns or questions  Please see past updates related to this goal by clicking on the "Past Updates" button in the selected goal      . COMPLETED: "To have no complications from GERD"       Current Barriers:  Marland Kitchen Knowledge Deficits related to disease process and Self Health management of Gastroesophageal Reflux Disorder  . Chronic Disease Management support and education needs related to GERD  Nurse Case Manager Clinical Goal(s):  Marland Kitchen Over the next 90 days, patient will verbalize basic understanding of GERD disease process and self health management plan as evidenced by patient will experience having no active symptoms or complications secondary to having GERD  CCM RN CM Interventions:  11/26/19 call completed with mother [name]  . Evaluation of current treatment plan related to GERD and patient's adherence to plan as established by provider . Determined patient is adhering to taking Pepcid and Omeprazole as directed by PCP with good effectiveness . Discussed patient did experienced increased epigastric pain after taking a prescribed antibiotic before having dental work and the dentist was made aware . Discussed plans with patient for ongoing care management follow up and provided patient with direct contact information for care management team  Patient Self Care Activities:  . Patient is unable to independently perform self care  Please see past updates related to this goal by clicking on the "Past Updates" button in the selected goal      . COMPLETED: "To have no complications related to Impaired Urinary Elimination"       Current Barriers:  Marland Kitchen Knowledge Deficits related  to treatment management for Impaired Urinary Elimination  . Chronic Disease Management support and education needs related to Impaired Urinary Elimination secondary to Spina Bifida   Nurse Case Manager Clinical Goal(s):  Marland Kitchen Over the next 90  days, patient will work with CCM RN CM to address needs related to Impaired Urinary Elimination  . Over the next 120 days, patient will experience no UTI's and or other complications secondary to having Impaired Urinary Elimination   CCM RN CM Interventions:  11/26/19 call completed with mother [name] . Evaluation of current treatment plan related to Impaired Urinary Elimination  and patient's adherence to plan as established by provider . Determined patient continues to be independent with I&O self catheterizations with no recent s/s suggestive of UTI . Determined patient is seen by Urology yearly and is able to call PCP as needed to report s/s suggestive of UTI such as strong odor and increased urinary incontinence . Determined patient receives her Urinary supplies from [place] and has no DME needs or questions at this time . Discussed plans with patient for ongoing care management follow up and provided patient with direct contact information for care management team  Patient Self Care Activities:  . Unable to independently perform Self Care  Please see past updates related to this goal by clicking on the "Past Updates" button in the selected goal      . COMPLETED: "To maintain adequate joint mobility"       Current Barriers:  Marland Kitchen Knowledge deficits related to Impaired Physical Mobility and Joint Immobility . Chronic Disease Management support and education needs related to Osteoarthritis of left knee and Congenital dysplasia or right hip  Nurse Case Manager Clinical Goal(s):  Marland Kitchen Over the next 90 days, the patient will demonstrate ongoing self health care management ability as evidenced by patient will verbalize ongoing adherence to her prescribed HEP (home exercise plan) * . Over the next 120 days, patient will experience no complications secondary to impaired physical mobility such as contractures, decreased ROM, pain or discomfort or falls   CCM RN CM Interventions:   11/26/19 call completed with mother [name] . Evaluation of current treatment plan related to Impaired Physical Mobility  and patient's adherence to plan as established by provider . Determined patient is ambulating daily w/o assistance; she walks daily uphill to the mailbox and back and sometimes goes beyond this point . Determined patient is adhering to performing daily stretches as instructed by PT and sometimes experiences hip pain from previous surgery but is able to stretch it out . Discussed plans with patient for ongoing care management follow up and provided patient with direct contact information for care management team  Patient Self Care Activities:  . Unable to independently perform Self Care  Please see past updates related to this goal by clicking on the "Past Updates" button in the selected goal      . COMPLETED: "To maintain good skin intregrity"       Current Barriers:  Marland Kitchen Knowledge Deficits related to maintaining good skin integrity and when to call the doctor for early signs of impaired skin integrity . Chronic Disease Management support and education needs related to Spina Bifida and high risk for impaired skin integrity   Nurse Case Manager Clinical Goal(s):  Marland Kitchen Over the next 90 days, patient will verbalize basic understanding of Impaired skin integrity disease process and self health management plan as evidenced by patient and caregiver will be able to verbalize how to recognize early  signs/symptoms of skin breakdown, have a good understanding about skin breakdown prevention, know when to call the doctor  . Over the next 120 days, patient will maintain good skin integrity as evidence by patient will experience having no pressure ulcerations, breaks in her skin or hospitalization for skin infection or breakdown   CCM RN CM Interventions:  11/26/19 call completed with mother [name] . Evaluation of current treatment plan related to high risk for Impaired Skin Integrity and  patient's adherence to plan as established by provider . Determined patient's skin integrity is good with no reports of skin breakdown and or impaired skin integrity at this time  . Determined patient and mother [name] verbalize having a good understanding about the importance of keeping skin clean and dry and to report early s/s of skin breakdown promptly . Discussed plans with patient for ongoing care management follow up and provided patient with direct contact information for care management tea  Patient Self Care Activities:  . Unable to independently perform self care  Please see past updates related to this goal by clicking on the "Past Updates" button in the selected goal        Patient verbalizes understanding of instructions provided today.   Telephone follow up appointment with care management team member scheduled for: 01/08/20  Barb Merino, RN, BSN, CCM Care Management Coordinator [place] [facility]  Direct Phone: [phone]

## 2019-11-27 NOTE — Chronic Care Management (AMB) (Signed)
Chronic Care Management   Follow Up Note   11/26/2019 Name: Mary Neal MRN: [id number] DOB: 14-May-1966  Referred by: Mary Chard, MD Reason for referral : Chronic Care Management (FU RN CM Call - HTN)   Mary Neal is a 54 y.o. year old female who is a primary care patient of Mary Chard, MD. The CCM team was consulted for assistance with chronic disease management and care coordination needs.    Review of patient status, including review of consultants reports, relevant laboratory and other test results, and collaboration with appropriate care team members and the patient's provider was performed as part of comprehensive patient evaluation and provision of chronic care management services.    SDOH (Social Determinants of Health) assessments performed: Yes See Care Plan activities for detailed interventions related to [place])   Placed outbound call to patient and [name] for a CCM RN CM care plan update.     Outpatient Encounter Medications as of 11/26/2019  Medication Sig  . famotidine (PEPCID) 20 MG tablet TAKE 1 TABLET BY MOUTH EVERY DAY  . omeprazole (PRILOSEC) 40 MG capsule TAKE 1 CAPSULE BY MOUTH EVERY DAY  . Cholecalciferol 1000 UNITS capsule Take 500 Units by mouth daily.  . Multiple Vitamin (MULTI-VITAMINS) TABS Take by mouth.  . oxybutynin (DITROPAN XL) 15 MG 24 hr tablet Take 15 mg by mouth 2 (two) times daily.   . polyethylene glycol (MIRALAX) packet Take 17 g by mouth daily as needed for moderate constipation or severe constipation.  . senna (SENOKOT) 8.6 MG TABS tablet Take 1 tablet by mouth. Takes 1 tab every other day  . valsartan (DIOVAN) 160 MG tablet Take 1 tablet (160 mg total) by mouth daily.  . vitamin C (ASCORBIC ACID) 500 MG tablet Take by mouth.   No facility-administered encounter medications on file as of 11/26/2019.     Objective: No results found for: HGBA1C Lab Results  Component Value Date   LDLCALC 103 (H) 06/27/2019   CREATININE 0.59 10/01/2019   BP Readings from Last 3 Encounters:  10/01/19 138/84  09/19/19 (!) 158/86  08/21/19 (!) 154/80     Goals Addressed    . "to get her BP under good control"       Mother stated  Current Barriers:  Marland Kitchen Knowledge Deficits related to disease process and Self Health management of HTN . Chronic Disease Management support and education needs related to HTN, Spina bifida w/o hydrocephalus, GERD, Osteoarthritis of left knee, Congential dysplasia of right hip  Nurse Case Manager Clinical Goal(s):  Marland Kitchen Over the next 90 days, patient will work with CCM RN and PCP  to address needs related to disease education and support for improved Self Health management of HTN  CCM RN CM Interventions:  11/26/19 call completed with [name] . Evaluation of current treatment plan related to HTN and patient's adherence to plan as established by provider. . Provided education to patient re: basic disease process of HTN; educated on importance of adhering to prescribed diet and medication regimen; implementing exercise into her daily/weekly routine and restricting Sodium: Determined patient and mom are checking patient's BP daily with good results; 105/63, 113/63; 110/58  . Reviewed medications with patient and discussed patient is adhering to taking Valsartan 160 mg qd as directed by PCP, denies noted SE or missed doses . Discussed plans with patient for ongoing care management follow up and provided patient with direct contact information for care management team . Advised patient, providing education and  rationale, to monitor blood pressure daily and record, calling CCM RN CM and or PCP for findings outside established parameters.  . Reviewed scheduled/upcoming provider appointments including: next scheduled OV for physical is scheduled with Mary Neal for 01/07/20 @11 :15 AM  Patient Self Care Activities:  . Self administers medications as prescribed . Attends all scheduled provider  appointments . Calls pharmacy for medication refills . Performs ADL's independently . Performs IADL's independently . Calls provider office for new concerns or questions  Please see past updates related to this goal by clicking on the "Past Updates" button in the selected goal      . COMPLETED: "To have no complications from GERD"       Current Barriers:  Marland Kitchen Knowledge Deficits related to disease process and Self Health management of Gastroesophageal Reflux Disorder  . Chronic Disease Management support and education needs related to GERD  Nurse Case Manager Clinical Goal(s):  Marland Kitchen Over the next 90 days, patient will verbalize basic understanding of GERD disease process and self health management plan as evidenced by patient will experience having no active symptoms or complications secondary to having GERD  CCM RN CM Interventions:  11/26/19 call completed with [name]  . Evaluation of current treatment plan related to GERD and patient's adherence to plan as established by provider . Determined patient is adhering to taking Pepcid and Omeprazole as directed by PCP with good effectiveness . Discussed patient did experienced increased epigastric pain after taking a prescribed antibiotic before having dental work and the dentist was made aware . Discussed plans with patient for ongoing care management follow up and provided patient with direct contact information for care management team  Patient Self Care Activities:  . Patient is unable to independently perform self care  Please see past updates related to this goal by clicking on the "Past Updates" button in the selected goal      . COMPLETED: "To have no complications related to Impaired Urinary Elimination"       Current Barriers:  Marland Kitchen Knowledge Deficits related to treatment management for Impaired Urinary Elimination  . Chronic Disease Management support and education needs related to Impaired Urinary Elimination secondary to  Spina Bifida   Nurse Case Manager Clinical Goal(s):  Marland Kitchen Over the next 90 days, patient will work with CCM RN CM to address needs related to Impaired Urinary Elimination  . Over the next 120 days, patient will experience no UTI's and or other complications secondary to having Impaired Urinary Elimination   CCM RN CM Interventions:  11/26/19 call completed with [name] . Evaluation of current treatment plan related to Impaired Urinary Elimination  and patient's adherence to plan as established by provider . Determined patient continues to be independent with I&O self catheterizations with no recent s/s suggestive of UTI . Determined patient is seen by Urology yearly and is able to call PCP as needed to report s/s suggestive of UTI such as strong odor and increased urinary incontinence . Determined patient receives her Urinary supplies from [place] and has no DME needs or questions at this time . Discussed plans with patient for ongoing care management follow up and provided patient with direct contact information for care management team  Patient Self Care Activities:  . Unable to independently perform Self Care  Please see past updates related to this goal by clicking on the "Past Updates" button in the selected goal     . COMPLETED: "To maintain adequate joint mobility"  Current Barriers:  Marland Kitchen Knowledge deficits related to Impaired Physical Mobility and Joint Immobility . Chronic Disease Management support and education needs related to Osteoarthritis of left knee and Congenital dysplasia or right hip  Nurse Case Manager Clinical Goal(s):  Marland Kitchen Over the next 90 days, the patient will demonstrate ongoing self health care management ability as evidenced by patient will verbalize ongoing adherence to her prescribed HEP (home exercise plan) * . Over the next 120 days, patient will experience no complications secondary to impaired physical mobility such as contractures,  decreased ROM, pain or discomfort or falls   CCM RN CM Interventions:  11/26/19 call completed with [name] . Evaluation of current treatment plan related to Impaired Physical Mobility  and patient's adherence to plan as established by provider . Determined patient is ambulating daily w/o assistance; she walks daily uphill to the mailbox and back and sometimes goes beyond this point . Determined patient is adhering to performing daily stretches as instructed by PT and sometimes experiences hip pain from previous surgery but is able to stretch it out . Discussed plans with patient for ongoing care management follow up and provided patient with direct contact information for care management team  Patient Self Care Activities:  . Unable to independently perform Self Care  Please see past updates related to this goal by clicking on the "Past Updates" button in the selected goal      . COMPLETED: "To maintain good skin intregrity"       Current Barriers:  Marland Kitchen Knowledge Deficits related to maintaining good skin integrity and when to call the doctor for early signs of impaired skin integrity . Chronic Disease Management support and education needs related to Spina Bifida and high risk for impaired skin integrity   Nurse Case Manager Clinical Goal(s):  Marland Kitchen Over the next 90 days, patient will verbalize basic understanding of Impaired skin integrity disease process and self health management plan as evidenced by patient and caregiver will be able to verbalize how to recognize early signs/symptoms of skin breakdown, have a good understanding about skin breakdown prevention, know when to call the doctor  . Over the next 120 days, patient will maintain good skin integrity as evidence by patient will experience having no pressure ulcerations, breaks in her skin or hospitalization for skin infection or breakdown   CCM RN CM Interventions:  11/26/19 call completed with [name] . Evaluation of  current treatment plan related to high risk for Impaired Skin Integrity and patient's adherence to plan as established by provider . Determined patient's skin integrity is good with no reports of skin breakdown and or impaired skin integrity at this time  . Determined patient and [name] verbalize having a good understanding about the importance of keeping skin clean and dry and to report early s/s of skin breakdown promptly . Discussed plans with patient for ongoing care management follow up and provided patient with direct contact information for care management tea  Patient Self Care Activities:  . Unable to independently perform self care  Please see past updates related to this goal by clicking on the "Past Updates" button in the selected goal        Plan:   Telephone follow up appointment with care management team member scheduled for: 01/08/20   Barb Merino, RN, BSN, CCM Care Management Coordinator [facility]  Direct Phone: [phone]

## 2019-12-02 ENCOUNTER — Ambulatory Visit: Payer: Medicare Other

## 2019-12-02 DIAGNOSIS — I10 Essential (primary) hypertension: Secondary | ICD-10-CM

## 2019-12-02 DIAGNOSIS — Q059 Spina bifida, unspecified: Secondary | ICD-10-CM

## 2019-12-02 NOTE — Chronic Care Management (AMB) (Signed)
  Chronic Care Management    Social Work Follow Up Note  12/02/2019 Name: Mary Neal MRN: [id number] DOB: 1966/02/19  Mary Neal is a 54 y.o. year old female who is a primary care patient of Glendale Chard, MD. The CCM team was consulted for assistance with care coordination.   Review of patient status, including review of consultants reports, other relevant assessments, and collaboration with appropriate care team members and the patient's provider was performed as part of comprehensive patient evaluation and provision of chronic care management services.    SDOH (Social Determinants of Health) assessments performed: No   SW placed a successful outbound call to the patients mother/caregiver in response to communication received from [facility] requesting follow up regarding dental benefits. Mrs. Burnor reports the patient has both Medicare and Medicaid and she is unclear which insurance covers what. "I need to call and talk to the receptionist again, she confused me". Advised Mrs. Hulslander the patient coverage depends on health plan benefits. Discussed opportunity to contact the patients Medicaid caseworker regarding Medicaid dental coverage as well as the patients Medicare plan to discuss dental benefits.    Outpatient Encounter Medications as of 12/02/2019  Medication Sig  . Cholecalciferol 1000 UNITS capsule Take 500 Units by mouth daily.  . famotidine (PEPCID) 20 MG tablet TAKE 1 TABLET BY MOUTH EVERY DAY  . Multiple Vitamin (MULTI-VITAMINS) TABS Take by mouth.  Marland Kitchen omeprazole (PRILOSEC) 40 MG capsule TAKE 1 CAPSULE BY MOUTH EVERY DAY  . oxybutynin (DITROPAN XL) 15 MG 24 hr tablet Take 15 mg by mouth 2 (two) times daily.   . polyethylene glycol (MIRALAX) packet Take 17 g by mouth daily as needed for moderate constipation or severe constipation.  . senna (SENOKOT) 8.6 MG TABS tablet Take 1 tablet by mouth. Takes 1 tab every other day  . valsartan (DIOVAN) 160 MG tablet  Take 1 tablet (160 mg total) by mouth daily.  . vitamin C (ASCORBIC ACID) 500 MG tablet Take by mouth.   No facility-administered encounter medications on file as of 12/02/2019.      Follow Up Plan: No SW follow up at this time. Encouraged Mrs. Monares to contact SW directly as needed.   Daneen Schick, BSW, CDP Social Worker, Certified Dementia Practitioner [place] [facility] [place] [facility] [phone]

## 2019-12-13 DIAGNOSIS — Z96641 Presence of right artificial hip joint: Secondary | ICD-10-CM | POA: Diagnosis not present

## 2019-12-13 DIAGNOSIS — Z471 Aftercare following joint replacement surgery: Secondary | ICD-10-CM | POA: Diagnosis not present

## 2019-12-16 DIAGNOSIS — Q059 Spina bifida, unspecified: Secondary | ICD-10-CM | POA: Diagnosis not present

## 2019-12-16 DIAGNOSIS — R269 Unspecified abnormalities of gait and mobility: Secondary | ICD-10-CM | POA: Diagnosis not present

## 2020-01-07 ENCOUNTER — Other Ambulatory Visit: Payer: Self-pay

## 2020-01-07 ENCOUNTER — Ambulatory Visit (INDEPENDENT_AMBULATORY_CARE_PROVIDER_SITE_OTHER): Payer: Medicare Other | Admitting: Internal Medicine

## 2020-01-07 ENCOUNTER — Encounter: Payer: Self-pay | Admitting: Internal Medicine

## 2020-01-07 VITALS — BP 116/82 | HR 88 | Temp 97.9°F | Ht <= 58 in | Wt 111.8 lb

## 2020-01-07 DIAGNOSIS — Z1159 Encounter for screening for other viral diseases: Secondary | ICD-10-CM

## 2020-01-07 DIAGNOSIS — G8220 Paraplegia, unspecified: Secondary | ICD-10-CM

## 2020-01-07 DIAGNOSIS — I10 Essential (primary) hypertension: Secondary | ICD-10-CM

## 2020-01-07 DIAGNOSIS — Z780 Asymptomatic menopausal state: Secondary | ICD-10-CM | POA: Diagnosis not present

## 2020-01-07 DIAGNOSIS — Q059 Spina bifida, unspecified: Secondary | ICD-10-CM | POA: Diagnosis not present

## 2020-01-08 ENCOUNTER — Telehealth: Payer: Self-pay

## 2020-01-08 LAB — HEPATITIS C ANTIBODY: Hep C Virus Ab: <0.1 s/co ratio (ref 0.0–0.9)

## 2020-01-19 NOTE — Progress Notes (Signed)
This visit occurred during the SARS-CoV-2 public health emergency.  Safety protocols were in place, including screening questions prior to the visit, additional usage of staff PPE, and extensive cleaning of exam room while observing appropriate contact time as indicated for disinfecting solutions.  Subjective:     Patient ID: Mary Neal , female    DOB: February 07, 1966 , 54 y.o.   MRN: [id number]   Chief Complaint  Patient presents with  . Hypertension  . Home Health Referral    [facility]    HPI  She presents today for BP check. She reports compliance with meds. She is accompanied by her Mother today.  Her Mother would like home health referral to help with her mobility.   Hypertension This is a chronic problem. The problem has been gradually improving since onset. The problem is controlled. Pertinent negatives include no blurred vision, chest pain, palpitations or shortness of breath. Past treatments include angiotensin blockers. The current treatment provides moderate improvement.     Past Medical History:  Diagnosis Date  . Abnormal Pap smear 2013  . Dislocation of right hip ([place]) 06/09/2018   Per MRI  . History of knee replacement 2005  . Spina bifida ([place])   . Uterine prolapse   . Wears glasses      Family History  Problem Relation Age of Onset  . Hypertension Mother   . Hyperlipidemia Mother   . Heart attack Brother      Current Outpatient Medications:  .  Cholecalciferol 1000 UNITS capsule, Take 500 Units by mouth daily., Disp: , Rfl:  .  famotidine (PEPCID) 20 MG tablet, TAKE 1 TABLET BY MOUTH EVERY DAY, Disp: 90 tablet, Rfl: 1 .  Multiple Vitamin (MULTI-VITAMINS) TABS, Take by mouth., Disp: , Rfl:  .  omeprazole (PRILOSEC) 40 MG capsule, TAKE 1 CAPSULE BY MOUTH EVERY DAY, Disp: 90 capsule, Rfl: 1 .  oxybutynin (DITROPAN XL) 15 MG 24 hr tablet, Take 15 mg by mouth 2 (two) times daily. , Disp: , Rfl:  .  polyethylene glycol (MIRALAX) packet, Take 17 g  by mouth daily as needed for moderate constipation or severe constipation., Disp: 14 each, Rfl: 0 .  senna (SENOKOT) 8.6 MG TABS tablet, Take 1 tablet by mouth. Takes 1 tab every other day, Disp: , Rfl:  .  valsartan (DIOVAN) 160 MG tablet, Take 1 tablet (160 mg total) by mouth daily., Disp: 90 tablet, Rfl: 1 .  vitamin C (ASCORBIC ACID) 500 MG tablet, Take by mouth., Disp: , Rfl:    Allergies  Allergen Reactions  . Latex Other (See Comments)  . Penicillins Other (See Comments)     Review of Systems  Constitutional: Negative.   Eyes: Negative for blurred vision.  Respiratory: Negative.  Negative for shortness of breath.   Cardiovascular: Negative.  Negative for chest pain and palpitations.  Gastrointestinal: Negative.   Genitourinary:       Her Mother would like for her to have GYN referral.   Neurological: Negative.   Psychiatric/Behavioral: Negative.      Today's Vitals   01/07/20 1048  BP: 116/82  Pulse: 88  Temp: 97.9 F (36.6 C)  TempSrc: Oral  Weight: 111 lb 12.8 oz (50.7 kg)  Height: 4\' 9"  (1.448 m)  PainSc: 0-No pain   Body mass index is 24.19 kg/m.   Objective:  Physical Exam Vitals and nursing note reviewed.  Constitutional:      Appearance: Normal appearance.  HENT:     Head: Normocephalic  and atraumatic.  Cardiovascular:     Rate and Rhythm: Normal rate and regular rhythm.     Heart sounds: Normal heart sounds.  Pulmonary:     Effort: Pulmonary effort is normal.     Breath sounds: Normal breath sounds.  Musculoskeletal:     Comments: Ambulatory with crutches  Skin:    General: Skin is warm.  Neurological:     General: No focal deficit present.     Mental Status: She is alert.  Psychiatric:        Mood and Affect: Mood normal.        Behavior: Behavior normal.         Assessment And Plan:     1. Essential hypertension, benign  Chronic, well controlled. She will continue with current meds. She is encouraged to avoid adding salt to her  foods.  2. Paraparesis (HCC)  Chronic. [facility] referral placed as requested for PT.   - Ambulatory referral to [place]  3. Spina bifida without hydrocephalus, unspecified spinal region [facility])  Chronic, please see above.   - Ambulatory referral to [place] [facility]  4. Postmenopausal  Encouraged to eat a clean diet, with hormone free meats. She also requests referral to GYN for pelvic exam.   5. Encounter for HCV screening test for low risk patient  - Hepatitis C antibody      Maximino Greenland, MD    THE PATIENT IS ENCOURAGED TO PRACTICE SOCIAL DISTANCING DUE TO THE COVID-19 PANDEMIC.

## 2020-01-28 ENCOUNTER — Telehealth: Payer: Medicare Other

## 2020-01-28 ENCOUNTER — Ambulatory Visit (INDEPENDENT_AMBULATORY_CARE_PROVIDER_SITE_OTHER): Payer: Medicare Other

## 2020-01-28 ENCOUNTER — Other Ambulatory Visit: Payer: Self-pay

## 2020-01-28 DIAGNOSIS — M1712 Unilateral primary osteoarthritis, left knee: Secondary | ICD-10-CM | POA: Diagnosis not present

## 2020-01-28 DIAGNOSIS — K219 Gastro-esophageal reflux disease without esophagitis: Secondary | ICD-10-CM

## 2020-01-28 DIAGNOSIS — I10 Essential (primary) hypertension: Secondary | ICD-10-CM | POA: Insufficient documentation

## 2020-01-28 DIAGNOSIS — Q6589 Other specified congenital deformities of hip: Secondary | ICD-10-CM

## 2020-01-28 DIAGNOSIS — Q059 Spina bifida, unspecified: Secondary | ICD-10-CM

## 2020-01-29 DIAGNOSIS — Q6589 Other specified congenital deformities of hip: Secondary | ICD-10-CM

## 2020-01-29 DIAGNOSIS — M1712 Unilateral primary osteoarthritis, left knee: Secondary | ICD-10-CM | POA: Diagnosis not present

## 2020-01-29 DIAGNOSIS — Q059 Spina bifida, unspecified: Secondary | ICD-10-CM | POA: Diagnosis not present

## 2020-01-29 DIAGNOSIS — I340 Nonrheumatic mitral (valve) insufficiency: Secondary | ICD-10-CM | POA: Diagnosis not present

## 2020-01-29 DIAGNOSIS — K219 Gastro-esophageal reflux disease without esophagitis: Secondary | ICD-10-CM | POA: Diagnosis not present

## 2020-01-29 DIAGNOSIS — G8220 Paraplegia, unspecified: Secondary | ICD-10-CM | POA: Diagnosis not present

## 2020-01-29 DIAGNOSIS — N814 Uterovaginal prolapse, unspecified: Secondary | ICD-10-CM | POA: Diagnosis not present

## 2020-01-29 DIAGNOSIS — Z96659 Presence of unspecified artificial knee joint: Secondary | ICD-10-CM | POA: Diagnosis not present

## 2020-01-29 DIAGNOSIS — I10 Essential (primary) hypertension: Secondary | ICD-10-CM | POA: Diagnosis not present

## 2020-01-29 NOTE — Chronic Care Management (AMB) (Signed)
Chronic Care Management   Follow Up Note   01/28/2020 Name: Mary Neal MRN: [id number] DOB: 04-27-1966  Referred by: Mary Chard, MD Reason for referral : Chronic Care Management (FU RN CM Inbound Call from caregiver )   Mary Neal is a 54 y.o. year old female who is a primary care patient of Mary Chard, MD. The CCM team was consulted for assistance with chronic disease management and care coordination needs.    Review of patient status, including review of consultants reports, relevant laboratory and other test results, and collaboration with appropriate care team members and the patient's provider was performed as part of comprehensive patient evaluation and provision of chronic care management services.    SDOH (Social Determinants of Health) assessments performed: Yes - community resources for social networking  See Care Plan activities for detailed interventions related to Mary Neal)   Inbound call from patient's [name] regarding patient referral for in home PT and resources needed.     Outpatient Encounter Medications as of 01/28/2020  Medication Sig  . Cholecalciferol 1000 UNITS capsule Take 500 Units by mouth daily.  . famotidine (PEPCID) 20 MG tablet TAKE 1 TABLET BY MOUTH EVERY DAY  . Multiple Vitamin (MULTI-VITAMINS) TABS Take by mouth.  Marland Kitchen omeprazole (PRILOSEC) 40 MG capsule TAKE 1 CAPSULE BY MOUTH EVERY DAY  . oxybutynin (DITROPAN XL) 15 MG 24 hr tablet Take 15 mg by mouth 2 (two) times daily.   . polyethylene glycol (MIRALAX) packet Take 17 g by mouth daily as needed for moderate constipation or severe constipation.  . senna (SENOKOT) 8.6 MG TABS tablet Take 1 tablet by mouth. Takes 1 tab every other day  . valsartan (DIOVAN) 160 MG tablet Take 1 tablet (160 mg total) by mouth daily.  . vitamin C (ASCORBIC ACID) 500 MG tablet Take by mouth.   No facility-administered encounter medications on file as of 01/28/2020.     Objective: No results  found for: HGBA1C Lab Results  Component Value Date   LDLCALC 103 (H) 06/27/2019   CREATININE 0.59 10/01/2019   BP Readings from Last 3 Encounters:  01/07/20 116/82  10/01/19 138/84  09/19/19 (!) 158/86     Goals Addressed    . "I need help contacting the home health agency"       Mother stated  Mary Neal (see longitudinal plan of care for additional care plan information)  Current Barriers:  Marland Kitchen Knowledge Deficits related to referred agency to provide in home PT services  . Chronic Disease Management support and education needs related to Spina bifida without hydrocephalus, unspecified, GERD, Osteoarthritis of left knee, Congenital dysplasia or right hip, HTN  Nurse Case Manager Clinical Goal(s):  Marland Kitchen Over the next 30 days, patient will work with [place] provider  to address needs related to PT needs for strengthening and endurance  . Over the next 90 days, patient will work with the CCM team and PCP for disease education and support to help improve/maintain physical mobility   CCM RN CM Interventions:  01/28/20 call completed with [name] . Inter-disciplinary care team collaboration (see longitudinal plan of care) . Evaluation of current treatment plan related to Impaired Physical Mobility and patient's adherence to plan as established by provider . Determined Mary Neal received a call from a [place] agency but declined the service due to not understanding Dr. Baird Cancer recently prescribed in home PT for the patient  . Reviewed and discussed Dr. Baird Cancer sent an in home PT  referral to assist patient with strengthening and endurance . Determined the referral was sent to [facility], a provider the patient had used in the past  . Placed outbound joint call to [facility], spoke with Mary Neal intake coordinator, she advised Mary Neal the referral was received and she will contact her later today in order to schedule the initial visit, Mary Neal understanding and is agreeable  . Discussed plans with patient for ongoing care management follow up and provided patient with direct contact information for care management team  Patient Self Care Activities:  . Self administers medications as prescribed . Attends all scheduled provider appointments . Calls pharmacy for medication refills . Calls provider office for new concerns or questions . Supportive mother to assist with care needs  Initial goal documentation    . "I need resources for [name] to get some socialization"       Mother stated CARE PLAN ENTRY (see longitudinal plan of care for additional care plan information)  Current Barriers:  Marland Kitchen Knowledge Deficits related to community resources for social networking  . Chronic Disease Management support and education needs related to Spina bifida without hydrocephalus, unspecified, GERD, Osteoarthritis of left knee, Congenital dysplasia or right hip, HTN  Nurse Case Manager Clinical Goal(s):  Marland Kitchen Over the next 45 days, patient will work with the embedded BSW to address needs related to community resources for social networking   CCM RN CM Interventions:  01/28/20 call completed with patient's [name] . Inter-disciplinary care team collaboration (see longitudinal plan of care) . Determined patient's mother is interested in learning about resources that may provide Mary Neal with options for social networking  . Collaborated with embedded BSW Mary Neal  regarding patient's request for community resources . Discussed plans with patient for ongoing care management follow up and provided patient with direct contact information for care management team  Patient Self Care Activities:  . Self administers medications as prescribed . Attends all scheduled provider appointments . Calls pharmacy for medication refills . Calls provider office for new concerns or questions . Supportive mother to assist with care needs    Initial goal documentation       Plan:   Telephone follow up appointment with care management team member scheduled for: 02/18/20  Barb Merino, RN, BSN, CCM Care Management Coordinator [place] [facility]  Direct Phone: [phone]

## 2020-01-29 NOTE — Patient Instructions (Signed)
Visit Information  Goals Addressed    . "I need help contacting the home health agency"       Mother stated  Mary Mary Neal (see longitudinal plan of care for additional care plan information)  Current Barriers:  Mary Mary Neal Kitchen Knowledge Deficits related to referred agency to provide in home PT services  . Chronic Disease Management support and education needs related to Spina bifida without hydrocephalus, unspecified, GERD, Osteoarthritis of left knee, Congenital dysplasia or right hip, HTN  Nurse Case Manager Clinical Goal(s):  Mary Mary Neal Kitchen Over the next 30 days, patient will work with Mary Mary Neal  to address needs related to PT needs for strengthening and endurance  . Over the next 90 days, patient will work with the CCM team and PCP for disease education and support to help improve/maintain physical mobility   CCM RN CM Interventions:  01/28/20 call completed with mother Mary Mary Neal . Inter-disciplinary care team collaboration (see longitudinal plan of care) . Evaluation of current treatment plan related to Impaired Physical Mobility and patient's adherence to plan as established by Mary Neal . Determined Mary Mary Neal received a call from a Mary Mary Neal agency but declined the service due to not understanding Mary Mary Neal prescribed in home PT for the patient  . Reviewed and discussed Mary Mary Neal sent an in home PT referral to assist patient with strengthening and endurance . Determined the referral was sent to Mary Mary Neal, a Mary Neal the patient had used in the past  . Placed outbound joint call to Mary Mary Neal, spoke with Mary Mary Neal intake coordinator, she advised Mary Mary Neal the referral was received and she will contact her later today in order to schedule the initial visit, Mary Mary Neal understanding and is agreeable  . Discussed plans with patient for ongoing care management follow up and provided patient with direct contact information for care management  team  Patient Self Care Activities:  . Self administers medications as prescribed . Attends all scheduled Mary Neal appointments . Calls pharmacy for medication refills . Calls Mary Neal office for new concerns or questions . Supportive mother to assist with care needs  Initial goal documentation     . "I need resources for Mary Mary Neal to get some socialization"       Mother stated CARE PLAN ENTRY (see longitudinal plan of care for additional care plan information)  Current Barriers:  Mary Mary Neal Kitchen Knowledge Deficits related to community resources for social networking  . Chronic Disease Management support and education needs related to Spina bifida without hydrocephalus, unspecified, GERD, Osteoarthritis of left knee, Congenital dysplasia or right hip, HTN  Nurse Case Manager Clinical Goal(s):  Mary Mary Neal Kitchen Over the next 45 days, patient will work with the embedded BSW to address needs related to community resources for social networking   CCM RN CM Interventions:  01/28/20 call completed with patient's mother Mary Mary Neal . Inter-disciplinary care team collaboration (see longitudinal plan of care) . Determined patient's mother is interested in learning about resources that may provide Mary Mary Neal with options for social networking  . Collaborated with embedded BSW Mary Mary Neal  regarding patient's request for community resources . Discussed plans with patient for ongoing care management follow up and provided patient with direct contact information for care management team  Patient Self Care Activities:  . Self administers medications as prescribed . Attends all scheduled Mary Neal appointments . Calls pharmacy for medication refills . Calls Mary Neal office for new concerns or questions . Supportive mother to assist with care needs   Initial goal documentation  Patient verbalizes understanding of instructions provided today.   Telephone follow up appointment with care management team member scheduled  for: 02/18/20  Mary Merino, RN, BSN, CCM Care Management Coordinator [place] [facility]  Direct Phone: [phone]

## 2020-01-31 DIAGNOSIS — G8220 Paraplegia, unspecified: Secondary | ICD-10-CM | POA: Diagnosis not present

## 2020-01-31 DIAGNOSIS — Q6589 Other specified congenital deformities of hip: Secondary | ICD-10-CM | POA: Diagnosis not present

## 2020-01-31 DIAGNOSIS — Q059 Spina bifida, unspecified: Secondary | ICD-10-CM | POA: Diagnosis not present

## 2020-01-31 DIAGNOSIS — M1712 Unilateral primary osteoarthritis, left knee: Secondary | ICD-10-CM | POA: Diagnosis not present

## 2020-01-31 DIAGNOSIS — I10 Essential (primary) hypertension: Secondary | ICD-10-CM | POA: Diagnosis not present

## 2020-01-31 DIAGNOSIS — I340 Nonrheumatic mitral (valve) insufficiency: Secondary | ICD-10-CM | POA: Diagnosis not present

## 2020-02-04 DIAGNOSIS — Q6589 Other specified congenital deformities of hip: Secondary | ICD-10-CM | POA: Diagnosis not present

## 2020-02-04 DIAGNOSIS — M1712 Unilateral primary osteoarthritis, left knee: Secondary | ICD-10-CM | POA: Diagnosis not present

## 2020-02-04 DIAGNOSIS — I10 Essential (primary) hypertension: Secondary | ICD-10-CM | POA: Diagnosis not present

## 2020-02-04 DIAGNOSIS — G8220 Paraplegia, unspecified: Secondary | ICD-10-CM | POA: Diagnosis not present

## 2020-02-04 DIAGNOSIS — Q059 Spina bifida, unspecified: Secondary | ICD-10-CM | POA: Diagnosis not present

## 2020-02-04 DIAGNOSIS — I340 Nonrheumatic mitral (valve) insufficiency: Secondary | ICD-10-CM | POA: Diagnosis not present

## 2020-02-07 ENCOUNTER — Telehealth: Payer: Self-pay

## 2020-02-07 ENCOUNTER — Telehealth: Payer: Medicare Other

## 2020-02-07 DIAGNOSIS — M1712 Unilateral primary osteoarthritis, left knee: Secondary | ICD-10-CM | POA: Diagnosis not present

## 2020-02-07 DIAGNOSIS — Q059 Spina bifida, unspecified: Secondary | ICD-10-CM | POA: Diagnosis not present

## 2020-02-07 DIAGNOSIS — G8220 Paraplegia, unspecified: Secondary | ICD-10-CM | POA: Diagnosis not present

## 2020-02-07 DIAGNOSIS — Q6589 Other specified congenital deformities of hip: Secondary | ICD-10-CM | POA: Diagnosis not present

## 2020-02-07 DIAGNOSIS — I10 Essential (primary) hypertension: Secondary | ICD-10-CM | POA: Diagnosis not present

## 2020-02-07 DIAGNOSIS — I340 Nonrheumatic mitral (valve) insufficiency: Secondary | ICD-10-CM | POA: Diagnosis not present

## 2020-02-07 NOTE — Telephone Encounter (Signed)
  Chronic Care Management   Outreach Note  02/07/2020 Name: Mary Neal MRN: [id number] DOB: 01/04/1966  Referred by: Glendale Chard, MD Reason for referral : Care Coordination   SW placed an unsuccessful outbound call to the patients mom, Mary Neal, in response to communication received from [facility] requesting SW assistance for resource needs. SW left a HIPAA compliant voice message requesting a return call.  Follow Up Plan: The care management team will reach out to the patient again over the next 14 days.   SIGNATURE

## 2020-02-11 DIAGNOSIS — Q059 Spina bifida, unspecified: Secondary | ICD-10-CM | POA: Diagnosis not present

## 2020-02-11 DIAGNOSIS — G8220 Paraplegia, unspecified: Secondary | ICD-10-CM | POA: Diagnosis not present

## 2020-02-11 DIAGNOSIS — I10 Essential (primary) hypertension: Secondary | ICD-10-CM | POA: Diagnosis not present

## 2020-02-18 ENCOUNTER — Telehealth: Payer: Medicare Other

## 2020-02-20 ENCOUNTER — Ambulatory Visit (INDEPENDENT_AMBULATORY_CARE_PROVIDER_SITE_OTHER): Payer: Medicare Other

## 2020-02-20 DIAGNOSIS — I10 Essential (primary) hypertension: Secondary | ICD-10-CM | POA: Diagnosis not present

## 2020-02-20 DIAGNOSIS — Q059 Spina bifida, unspecified: Secondary | ICD-10-CM

## 2020-02-20 DIAGNOSIS — Q6589 Other specified congenital deformities of hip: Secondary | ICD-10-CM

## 2020-02-20 NOTE — Patient Instructions (Signed)
Social Worker Visit Information  Goals we discussed today:  Goals Addressed            This Visit's Progress   . "I need resources for [place] to get some socialization"   On track    Mother stated [place] (see longitudinal plan of care for additional care plan information)  Current Barriers:  Marland Kitchen Knowledge Deficits related to community resources for social networking  . Chronic Disease Management support and education needs related to Spina bifida without hydrocephalus, unspecified, GERD, Osteoarthritis of left knee, Congenital dysplasia or right hip, HTN  Nurse Case Manager Clinical Goal(s):  Marland Kitchen Over the next 45 days, patient will work with the embedded BSW to address needs related to community resources for social networking   CCM SW Interventions Completed 02/20/20 with the patient and her mother [name] . Inter-disciplinary care team collaboration (see longitudinal plan of care) . Successful outbound call placed to the patient and her mother/caregiver Harriet . Determined the patient is interested in programming opportunities to boost socialization out of the home o The patient enjoys arts and crafts with an interest in loom knitting . Discussed the patient has already looked into [facility] but feels the day center would not fulfil her interests . Provided education surrounding the [facility] which incorporates water aerobics and other activities into daily programming o The patient reports she already receives their e-mailed activity calendar and does not feel this particular program is appropriate for her . Accessed [id number] referral platform to explore alternative resources- none found that meet the patient interest . Provided education on the [place] located within the [place]  o Discussed programming surrounding cooking classes, jewelry making, and cardio drumming o Determined the patient is interested in  learning more about this program o Collaboration with Velda Shell, director of [place] to request patient be outreached via e-mail to introduce the program and provide a monthly activity calendar . Collaboration with teammates to inquire of alternative programming resources the patient may enjoy . Scheduled follow up call over the next 3 weeks  CCM RN CM Interventions:  01/28/20 call completed with patient's mother [name] . Inter-disciplinary care team collaboration (see longitudinal plan of care) . Determined patient's mother is interested in learning about resources that may provide [name] with options for social networking  . Collaborated with embedded BSW Daneen Schick  regarding patient's request for community resources . Discussed plans with patient for ongoing care management follow up and provided patient with direct contact information for care management team  Patient Self Care Activities:  . Self administers medications as prescribed . Attends all scheduled provider appointments . Calls pharmacy for medication refills . Calls provider office for new concerns or questions . Supportive mother to assist with care needs   Please see past updates related to this goal by clicking on the "Past Updates" button in the selected goal          Materials Provided: Verbal education about senior resources provided by phone  Follow Up Plan: SW will follow up with patient by phone over the next three weeks.   Daneen Schick, BSW, CDP Social Worker, Certified Dementia Practitioner [place] [facility] [place] [facility] [phone]

## 2020-02-20 NOTE — Chronic Care Management (AMB) (Signed)
Chronic Care Management    Social Work Follow Up Note  02/20/2020 Name: Mary Neal MRN: [id number] DOB: Jun 24, 1966  Mary Neal is a 54 y.o. year old female who is a primary care patient of Glendale Chard, MD. The CCM team was consulted for assistance with care coordination.   Review of patient status, including review of consultants reports, other relevant assessments, and collaboration with appropriate care team members and the patient's provider was performed as part of comprehensive patient evaluation and provision of chronic care management services.    SDOH (Social Determinants of Health) assessments performed: Yes SDOH Interventions     Most Recent Value  SDOH Interventions  Social Connections Interventions Other (Comment)  [[facility] [place]]       Outpatient Encounter Medications as of 02/20/2020  Medication Sig  . Cholecalciferol 1000 UNITS capsule Take 500 Units by mouth daily.  . famotidine (PEPCID) 20 MG tablet TAKE 1 TABLET BY MOUTH EVERY DAY  . Multiple Vitamin (MULTI-VITAMINS) TABS Take by mouth.  Marland Kitchen omeprazole (PRILOSEC) 40 MG capsule TAKE 1 CAPSULE BY MOUTH EVERY DAY  . oxybutynin (DITROPAN XL) 15 MG 24 hr tablet Take 15 mg by mouth 2 (two) times daily.   . polyethylene glycol (MIRALAX) packet Take 17 g by mouth daily as needed for moderate constipation or severe constipation.  . senna (SENOKOT) 8.6 MG TABS tablet Take 1 tablet by mouth. Takes 1 tab every other day  . valsartan (DIOVAN) 160 MG tablet Take 1 tablet (160 mg total) by mouth daily.  . vitamin C (ASCORBIC ACID) 500 MG tablet Take by mouth.   No facility-administered encounter medications on file as of 02/20/2020.     Goals Addressed            This Visit's Progress   . "I need resources for Mary Neal to get some socialization"   On track    Mother stated Mary Neal (see longitudinal plan of care for additional care plan information)  Current Barriers:    Marland Kitchen Knowledge Deficits related to community resources for social networking  . Chronic Disease Management support and education needs related to Spina bifida without hydrocephalus, unspecified, GERD, Osteoarthritis of left knee, Congenital dysplasia or right hip, HTN  Nurse Case Manager Clinical Goal(s):  Marland Kitchen Over the next 45 days, patient will work with the embedded BSW to address needs related to community resources for social networking   CCM SW Interventions Completed 02/20/20 with the patient and her [name] . Inter-disciplinary care team collaboration (see longitudinal plan of care) . Successful outbound call placed to the patient and her mother/caregiver Mary Neal . Determined the patient is interested in programming opportunities to boost socialization out of the home o The patient enjoys arts and crafts with an interest in loom knitting . Discussed the patient has already looked into [facility] but feels the day center would not fulfil her interests . Provided education surrounding the [facility] which incorporates water aerobics and other activities into daily programming o The patient reports she already receives their e-mailed activity calendar and does not feel this particular program is appropriate for her . Accessed [id number] referral platform to explore alternative resources- none found that meet the patient interest . Provided education on the [place] located within the [place]  o Discussed programming surrounding cooking classes, jewelry making, and cardio drumming o Determined the patient is interested in learning more about this program o  Collaboration with Velda Shell, director of [place] to request patient be outreached via e-mail to introduce the program and provide a monthly activity calendar . Collaboration with teammates to inquire of alternative programming resources the patient may  enjoy . Scheduled follow up call over the next 3 weeks  CCM RN CM Interventions:  01/28/20 call completed with patient's [name] . Inter-disciplinary care team collaboration (see longitudinal plan of care) . Determined patient's mother is interested in learning about resources that may provide Mary Neal with options for social networking  . Collaborated with embedded BSW Mary Neal  regarding patient's request for community resources . Discussed plans with patient for ongoing care management follow up and provided patient with direct contact information for care management team  Patient Self Care Activities:  . Self administers medications as prescribed . Attends all scheduled provider appointments . Calls pharmacy for medication refills . Calls provider office for new concerns or questions . Supportive mother to assist with care needs   Please see past updates related to this goal by clicking on the "Past Updates" button in the selected goal          Follow Up Plan: SW will follow up with patient by phone over the next three weeks.   Mary Neal, BSW, CDP Social Worker, Certified Dementia Practitioner [place] [facility] [place] [facility] [phone]  Total time spent performing care coordination and/or care management activities with the patient by phone or face to face = 21 minutes.

## 2020-03-05 ENCOUNTER — Ambulatory Visit: Payer: Medicare Other

## 2020-03-05 DIAGNOSIS — I10 Essential (primary) hypertension: Secondary | ICD-10-CM

## 2020-03-05 DIAGNOSIS — Q059 Spina bifida, unspecified: Secondary | ICD-10-CM

## 2020-03-05 DIAGNOSIS — Q6589 Other specified congenital deformities of hip: Secondary | ICD-10-CM

## 2020-03-05 NOTE — Chronic Care Management (AMB) (Signed)
Chronic Care Management    Social Work Follow Up Note  03/05/2020 Name: Mary Neal MRN: [id number] DOB: 1965/12/21  Mary Neal is a 54 y.o. year old female who is a primary care patient of Glendale Chard, MD. The CCM team was consulted for assistance with Care Coordination.   Review of patient status, including review of consultants reports, other relevant assessments, and collaboration with appropriate care team members and the patient's provider was performed as part of comprehensive patient evaluation and provision of chronic care management services.    SDOH (Social Determinants of Health) assessments performed: No    Outpatient Encounter Medications as of 03/05/2020  Medication Sig  . Cholecalciferol 1000 UNITS capsule Take 500 Units by mouth daily.  . famotidine (PEPCID) 20 MG tablet TAKE 1 TABLET BY MOUTH EVERY DAY  . Multiple Vitamin (MULTI-VITAMINS) TABS Take by mouth.  Marland Kitchen omeprazole (PRILOSEC) 40 MG capsule TAKE 1 CAPSULE BY MOUTH EVERY DAY  . oxybutynin (DITROPAN XL) 15 MG 24 hr tablet Take 15 mg by mouth 2 (two) times daily.   . polyethylene glycol (MIRALAX) packet Take 17 g by mouth daily as needed for moderate constipation or severe constipation.  . senna (SENOKOT) 8.6 MG TABS tablet Take 1 tablet by mouth. Takes 1 tab every other day  . valsartan (DIOVAN) 160 MG tablet Take 1 tablet (160 mg total) by mouth daily.  . vitamin C (ASCORBIC ACID) 500 MG tablet Take by mouth.   No facility-administered encounter medications on file as of 03/05/2020.     Goals Addressed            This Visit's Progress   . COMPLETED: "I need resources for Mary Neal to get some socialization"       Mother stated CARE PLAN ENTRY (see longitudinal plan of care for additional care plan information)  Current Barriers:  Marland Kitchen Knowledge Deficits related to community resources for social networking  . Chronic Disease Management support and education needs related to Spina bifida  without hydrocephalus, unspecified, GERD, Osteoarthritis of left knee, Congenital dysplasia or right hip, HTN  Nurse Case Manager Clinical Goal(s):  Marland Kitchen Over the next 45 days, patient will work with the embedded BSW to address needs related to community resources for social networking   CCM SW Interventions Completed 03/05/20 with the patient and her [name] . Successful outbound call placed to the patient to assess goal progression . Determined the patient has been in contact with Mary Neal with [facility] to obtain the activity calendar for the program o The patient reports she is mainly interested in [employer] and does not wish to participate in alternative programs . Advised the patient SW is unable to locate local [place] groups . Discussed patient ability to access virtual Loom Knitting groups via Facebook o Patient reports plans to join virtual groups . Goal Met Completed 02/20/20 with the patient and her [name] . Inter-disciplinary care team collaboration (see longitudinal plan of care) . Successful outbound call placed to the patient and her mother/caregiver Mary Neal . Determined the patient is interested in programming opportunities to boost socialization out of the home o The patient enjoys arts and crafts with an interest in loom knitting . Discussed the patient has already looked into [facility] but feels the day center would not fulfil her interests . Provided education surrounding the [facility] which incorporates water aerobics and other activities into daily programming o The patient reports she already receives  their e-mailed activity calendar and does not feel this particular program is appropriate for her . Accessed [id number] referral platform to explore alternative resources- none found that meet the patient interest . Provided education on the [place] located within the [place]  o Discussed programming surrounding cooking classes, jewelry making, and cardio drumming o Determined the patient is interested in learning more about this program o Collaboration with Mary Neal, director of [place] to request patient be outreached via e-mail to introduce the program and provide a monthly activity calendar . Collaboration with teammates to inquire of alternative programming resources the patient may enjoy . Scheduled follow up call over the next 3 weeks  CCM RN CM Interventions:  01/28/20 call completed with patient's [name] . Inter-disciplinary care team collaboration (see longitudinal plan of care) . Determined patient's mother is interested in learning about resources that may provide [name] with options for social networking  . Collaborated with embedded BSW Mary Neal  regarding patient's request for community resources . Discussed plans with patient for ongoing care management follow up and provided patient with direct contact information for care management team  Patient Self Care Activities:  . Self administers medications as prescribed . Attends all scheduled provider appointments . Calls pharmacy for medication refills . Calls provider office for new concerns or questions . Supportive mother to assist with care needs   Please see past updates related to this goal by clicking on the "Past Updates" button in the selected goal          Follow Up Plan: No SW follow up planned at this time. The patient will remain active with RN Care Manager.   Mary Neal, BSW, CDP Social Worker, Certified Dementia Practitioner [place] [facility] [place] [facility] [phone]  Total time spent performing care coordination and/or care management activities with the patient by phone or face to face = 11 minutes.

## 2020-03-05 NOTE — Patient Instructions (Signed)
Social Worker Visit Information  Goals we discussed today:  Goals Addressed            This Visit's Progress   . COMPLETED: "I need resources for [place] to get some socialization"       Mother stated CARE PLAN ENTRY (see longitudinal plan of care for additional care plan information)  Current Barriers:  Marland Kitchen Knowledge Deficits related to community resources for social networking  . Chronic Disease Management support and education needs related to Spina bifida without hydrocephalus, unspecified, GERD, Osteoarthritis of left knee, Congenital dysplasia or right hip, HTN  Nurse Case Manager Clinical Goal(s):  Marland Kitchen Over the next 45 days, patient will work with the embedded BSW to address needs related to community resources for social networking   CCM SW Interventions Completed 03/05/20 with the patient and her mother [name] . Successful outbound call placed to the patient to assess goal progression . Determined the patient has been in contact with Velda Shell with [facility] to obtain the activity calendar for the program o The patient reports she is mainly interested in [employer] and does not wish to participate in alternative programs . Advised the patient SW is unable to locate local [place] groups . Discussed patient ability to access virtual [place] Kn[employer] groups via Facebook o Patient reports plans to join virtual groups . Goal Met Completed 02/20/20 with the patient and her mother [name] . Inter-disciplinary care team collaboration (see longitudinal plan of care) . Successful outbound call placed to the patient and her mother/caregiver Harriet . Determined the patient is interested in programming opportunities to boost socialization out of the home o The patient enjoys arts and crafts with an interest in loom knitting . Discussed the patient has already looked into [facility] but feels the day center would not fulfil her interests . Provided  education surrounding the [facility] which incorporates water aerobics and other activities into daily programming o The patient reports she already receives their e-mailed activity calendar and does not feel this particular program is appropriate for her . Accessed [id number] referral platform to explore alternative resources- none found that meet the patient interest . Provided education on the [place] located within the [place]  o Discussed programming surrounding cooking classes, jewelry making, and cardio drumming o Determined the patient is interested in learning more about this program o Collaboration with Velda Shell, director of [place] to request patient be outreached via e-mail to introduce the program and provide a monthly activity calendar . Collaboration with teammates to inquire of alternative programming resources the patient may enjoy . Scheduled follow up call over the next 3 weeks  CCM RN CM Interventions:  01/28/20 call completed with patient's mother [name] . Inter-disciplinary care team collaboration (see longitudinal plan of care) . Determined patient's mother is interested in learning about resources that may provide [name] with options for social networking  . Collaborated with embedded BSW Daneen Schick  regarding patient's request for community resources . Discussed plans with patient for ongoing care management follow up and provided patient with direct contact information for care management team  Patient Self Care Activities:  . Self administers medications as prescribed . Attends all scheduled provider appointments . Calls pharmacy for medication refills . Calls provider office for new concerns or questions . Supportive mother to assist with care needs   Please see past updates related to this goal by clicking on the "Past  Updates" button in the selected goal          Materials  Provided: Verbal education about virtual [employer] Knitting Groups provided by phone  Follow Up Plan: No SW follow up planned at this time. The patient will remain active with RN Care Manager.   Daneen Schick, BSW, CDP Social Worker, Certified Dementia Practitioner [place] [facility] [place] [facility] [phone]

## 2020-03-25 ENCOUNTER — Other Ambulatory Visit: Payer: Self-pay | Admitting: Internal Medicine

## 2020-03-25 ENCOUNTER — Other Ambulatory Visit: Payer: Self-pay

## 2020-03-25 ENCOUNTER — Ambulatory Visit: Payer: Self-pay

## 2020-03-25 ENCOUNTER — Telehealth: Payer: Medicare Other

## 2020-03-25 DIAGNOSIS — M1712 Unilateral primary osteoarthritis, left knee: Secondary | ICD-10-CM

## 2020-03-25 DIAGNOSIS — K219 Gastro-esophageal reflux disease without esophagitis: Secondary | ICD-10-CM

## 2020-03-25 DIAGNOSIS — Q6589 Other specified congenital deformities of hip: Secondary | ICD-10-CM

## 2020-03-25 DIAGNOSIS — I10 Essential (primary) hypertension: Secondary | ICD-10-CM

## 2020-03-25 DIAGNOSIS — Q059 Spina bifida, unspecified: Secondary | ICD-10-CM

## 2020-03-31 NOTE — Patient Instructions (Signed)
Visit Information  Goals Addressed      Patient Stated   .  "to have her hand and arm pain evaluated" (pt-stated)        CARE PLAN ENTRY (see longitudinal plan of care for additional care plan information)  Current Barriers:  Marland Kitchen Knowledge Deficits related to evaluation and treatment of bilateral hand and arm pain . Chronic Disease Management support and education needs related to Spina bifida without hydrocephalus, unspecified, GERD, Osteoarthritis of left knee, Congenital dysplasia or right hip, HTN  Nurse Case Manager Clinical Goal(s):  Marland Kitchen Over the next 30 days, patient will work with PCP to address needs related to evaluation and treatment of bilateral hand and arm pain   CCM RN CM Interventions:  03/25/20 call completed with [name] . Inter-disciplinary care team collaboration (see longitudinal plan of care) . Evaluation of current treatment plan related to acute bilateral hand/arm pain and patient's adherence to plan as established by provider . Determined patient is experiencing bilateral hand and arm pain that is sudden and intermittent causing her moderate to severe discomfort . Determined patient would like an OV scheduled to have these symptoms evaluated by Dr. Baird Cancer . Collaborated with PCP Dr. Glendale Chard via secure message regarding patient's reported symptoms and request for an OV for evaluation and treatment of these symptoms . Discussed plans with patient for ongoing care management follow up and provided patient with direct contact information for care management team  Patient Self Care Activities:  . Self administers medications as prescribed . Attends all scheduled provider appointments . Calls pharmacy for medication refills . Calls provider office for new concerns or questions . Supportive mother to assist with care needs  Initial goal documentation       Other   .  COMPLETED: "to get her BP under good control"        Mother stated  Current  Barriers:  Marland Kitchen Knowledge Deficits related to disease process and Self Health management of HTN . Chronic Disease Management support and education needs related to HTN, Spina bifida w/o hydrocephalus, GERD, Osteoarthritis of left knee, Congential dysplasia of right hip  Nurse Case Manager Clinical Goal(s):  Marland Kitchen Over the next 90 days, patient will work with CCM RN and PCP  to address needs related to disease education and support for improved Self Health management of HTN  Goal Met   CCM RN CM Interventions:  03/31/20 call completed with [name] . Evaluation of current treatment plan related to HTN and patient's adherence to plan as established by provider. Marland Kitchen Re-educated re: basic disease process of HTN; educated on importance of adhering to prescribed diet and medication regimen; implementing exercise into her daily/weekly routine and restricting Sodium: Determined patient and mom are checking patient's BP daily with good BP within target range and no episodes of elevated BP when checked at home . Reviewed medications, determined patient is adhering to taking Valsartan 160 mg qd as directed by PCP, denies noted SE or missed doses . Advised patient, providing education and rationale, to monitor blood pressure daily and record, calling CCM RN CM and or PCP for findings outside established parameters.  . Discussed plans with patient for ongoing care management follow up and provided patient with direct contact information for care management team  Patient Self Care Activities:  . Self administers medications as prescribed . Attends all scheduled provider appointments . Calls pharmacy for medication refills . Calls provider office for new concerns or questions  Please see past  updates related to this goal by clicking on the "Past Updates" button in the selected goal        Patient verbalizes understanding of instructions provided today.   Telephone follow up appointment with care management  team member scheduled for: 05/06/20  Mary Merino, RN, BSN, CCM  Care Management Coordinator [facility]  Direct Phone: [phone]

## 2020-03-31 NOTE — Chronic Care Management (AMB) (Signed)
Chronic Care Management   Follow Up Note   03/25/2020 Name: Mary Neal MRN: [id number] DOB: 08-22-1965  Referred by: Mary Chard, MD Reason for referral : Chronic Care Management (FU RN CM Call )   Mary Neal is a 54 y.o. year old female who is a primary care patient of Mary Chard, MD. The CCM team was consulted for assistance with chronic disease management and care coordination needs.    Review of patient status, including review of consultants reports, relevant laboratory and other test results, and collaboration with appropriate care team members and the patient's provider was performed as part of comprehensive patient evaluation and provision of chronic care management services.    SDOH (Social Determinants of Health) assessments performed: Yes See Care Plan activities for detailed interventions related to [place])   Placed outbound CCM RN CM follow up call for a care plan update.     Outpatient Encounter Medications as of 03/25/2020  Medication Sig  . Cholecalciferol 1000 UNITS capsule Take 500 Units by mouth daily.  . famotidine (PEPCID) 20 MG tablet TAKE 1 TABLET BY MOUTH EVERY DAY  . Multiple Vitamin (MULTI-VITAMINS) TABS Take by mouth.  Marland Kitchen omeprazole (PRILOSEC) 40 MG capsule TAKE 1 CAPSULE BY MOUTH EVERY DAY  . oxybutynin (DITROPAN XL) 15 MG 24 hr tablet Take 15 mg by mouth 2 (two) times daily.   . polyethylene glycol (MIRALAX) packet Take 17 g by mouth daily as needed for moderate constipation or severe constipation.  . senna (SENOKOT) 8.6 MG TABS tablet Take 1 tablet by mouth. Takes 1 tab every other day  . valsartan (DIOVAN) 160 MG tablet TAKE 1 TABLET BY MOUTH EVERY DAY  . vitamin C (ASCORBIC ACID) 500 MG tablet Take by mouth.   No facility-administered encounter medications on file as of 03/25/2020.     Objective:  No results found for: HGBA1C Lab Results  Component Value Date   LDLCALC 103 (H) 06/27/2019   CREATININE 0.59 10/01/2019   BP  Readings from Last 3 Encounters:  01/07/20 116/82  10/01/19 138/84  09/19/19 (!) 158/86    Goals Addressed      Patient Stated   .  "to have her hand and arm pain evaluated" (pt-stated)        CARE PLAN ENTRY (see longitudinal plan of care for additional care plan information)  Current Barriers:  Marland Kitchen Knowledge Deficits related to evaluation and treatment of bilateral hand and arm pain . Chronic Disease Management support and education needs related to Spina bifida without hydrocephalus, unspecified, GERD, Osteoarthritis of left knee, Congenital dysplasia or right hip, HTN  Nurse Case Manager Clinical Goal(s):  Marland Kitchen Over the next 30 days, patient will work with PCP to address needs related to evaluation and treatment of bilateral hand and arm pain   CCM RN CM Interventions:  03/25/20 call completed with [name] . Inter-disciplinary care team collaboration (see longitudinal plan of care) . Evaluation of current treatment plan related to acute bilateral hand/arm pain and patient's adherence to plan as established by provider . Determined patient is experiencing bilateral hand and arm pain that is sudden and intermittent causing her moderate to severe discomfort . Determined patient would like an OV scheduled to have these symptoms evaluated by Dr. Baird Cancer . Collaborated with PCP Dr. Glendale Neal via secure message regarding patient's reported symptoms and request for an OV for evaluation and treatment of these symptoms . Discussed plans with patient for ongoing care management follow up and provided  patient with direct contact information for care management team  Patient Self Care Activities:  . Self administers medications as prescribed . Attends all scheduled provider appointments . Calls pharmacy for medication refills . Calls provider office for new concerns or questions . Supportive mother to assist with care needs  Initial goal documentation      Other   .  COMPLETED:  "to get her BP under good control"        Mother stated  Current Barriers:  Marland Kitchen Knowledge Deficits related to disease process and Self Health management of HTN . Chronic Disease Management support and education needs related to HTN, Spina bifida w/o hydrocephalus, GERD, Osteoarthritis of left knee, Congential dysplasia of right hip  Nurse Case Manager Clinical Goal(s):  Marland Kitchen Over the next 90 days, patient will work with CCM RN and PCP  to address needs related to disease education and support for improved Self Health management of HTN  Goal Met   CCM RN CM Interventions:  03/31/20 call completed with [name] . Evaluation of current treatment plan related to HTN and patient's adherence to plan as established by provider. Marland Kitchen Re-educated re: basic disease process of HTN; educated on importance of adhering to prescribed diet and medication regimen; implementing exercise into her daily/weekly routine and restricting Sodium: Determined patient and mom are checking patient's BP daily with good BP within target range and no episodes of elevated BP when checked at home . Reviewed medications, determined patient is adhering to taking Valsartan 160 mg qd as directed by PCP, denies noted SE or missed doses . Advised patient, providing education and rationale, to monitor blood pressure daily and record, calling CCM RN CM and or PCP for findings outside established parameters.  . Discussed plans with patient for ongoing care management follow up and provided patient with direct contact information for care management team  Patient Self Care Activities:  . Self administers medications as prescribed . Attends all scheduled provider appointments . Calls pharmacy for medication refills . Calls provider office for new concerns or questions  Please see past updates related to this goal by clicking on the "Past Updates" button in the selected goal         Plan:   Telephone follow up appointment with care  management team member scheduled for: 05/06/20  Barb Merino, RN, BSN, CCM Care Management Coordinator [place]  Direct Phone: [phone]

## 2020-04-01 ENCOUNTER — Telehealth: Payer: Self-pay

## 2020-04-01 ENCOUNTER — Other Ambulatory Visit: Payer: Self-pay | Admitting: Internal Medicine

## 2020-04-01 MED ORDER — AZITHROMYCIN 250 MG PO TABS: ORAL_TABLET | ORAL | 0 refills | Status: DC

## 2020-04-01 NOTE — Telephone Encounter (Signed)
The pt's mom Mary Neal was asked if the pt is allergic to amoxicillin because the pt had an allergic reaction that gave her palpitations after taking clindamycin.  The pt's mom Mary Neal said that it's been so long that she can't remember.

## 2020-04-08 ENCOUNTER — Ambulatory Visit (INDEPENDENT_AMBULATORY_CARE_PROVIDER_SITE_OTHER): Payer: Medicare Other | Admitting: Nurse Practitioner

## 2020-04-08 ENCOUNTER — Other Ambulatory Visit: Payer: Self-pay

## 2020-04-08 ENCOUNTER — Encounter: Payer: Self-pay | Admitting: Nurse Practitioner

## 2020-04-08 VITALS — BP 110/72 | HR 75 | Temp 98.1°F | Wt 102.6 lb

## 2020-04-08 DIAGNOSIS — Z23 Encounter for immunization: Secondary | ICD-10-CM | POA: Diagnosis not present

## 2020-04-08 DIAGNOSIS — I10 Essential (primary) hypertension: Secondary | ICD-10-CM | POA: Diagnosis not present

## 2020-04-08 DIAGNOSIS — R252 Cramp and spasm: Secondary | ICD-10-CM | POA: Diagnosis not present

## 2020-04-08 MED ORDER — MAGNESIUM 250 MG PO TABS: 1.0000 | ORAL_TABLET | Freq: Every evening | ORAL | 1 refills | Status: DC

## 2020-04-08 NOTE — Patient Instructions (Signed)
Take magnesium 1 tablet with evening meal - 250 mg If this is not working can take CoQ10 - which is a vitamin over the counter as well.

## 2020-04-08 NOTE — Progress Notes (Signed)
I,Yamilka Roman [employer] as a Education administrator for [employer], FNP.,have documented all relevant documentation on the behalf of Minette Brine, FNP,as directed by  Minette Brine, FNP while in the presence of Minette Brine, [place]. This visit occurred during the SARS-CoV-2 public health emergency.  Safety protocols were in place, including screening questions prior to the visit, additional usage of staff PPE, and extensive cleaning of exam room while observing appropriate contact time as indicated for disinfecting solutions.  Subjective:     Patient ID: Mary Neal , female    DOB: 18-Oct-1965 , 54 y.o.   MRN: [id number]   Chief Complaint  Patient presents with  . cramps in hands    patient has been having crampsi n her hands on and off for a while now  she stated the cramp will go up her arm    HPI  She has been having cramps that starts in her hands mainly in her left hand up to her upper arm.  She has been using thera works.  She is not taking any magnesium.  It will sometimes cause her to not be able to use her hand.  She is unable to use her hand when she has the cramps. Pain is intermittent at no specific time however her mother notices more in the day time.     Past Medical History:  Diagnosis Date  . Abnormal Pap smear 2013  . Dislocation of right hip ([place]) 06/09/2018   Per MRI  . History of knee replacement 2005  . Spina bifida ([place])   . Uterine prolapse   . Wears glasses      Family History  Problem Relation Age of Onset  . Hypertension Mother   . Hyperlipidemia Mother   . Heart attack Brother      Current Outpatient Medications:  .  Cholecalciferol 1000 UNITS capsule, Take 500 Units by mouth daily., Disp: , Rfl:  .  famotidine (PEPCID) 20 MG tablet, TAKE 1 TABLET BY MOUTH EVERY DAY, Disp: 90 tablet, Rfl: 1 .  Multiple Vitamin (MULTI-VITAMINS) TABS, Take by mouth., Disp: , Rfl:  .  omeprazole (PRILOSEC) 40 MG capsule, TAKE 1 CAPSULE BY MOUTH EVERY DAY, Disp: 90 capsule,  Rfl: 1 .  oxybutynin (DITROPAN XL) 15 MG 24 hr tablet, Take 15 mg by mouth 2 (two) times daily. , Disp: , Rfl:  .  polyethylene glycol (MIRALAX) packet, Take 17 g by mouth daily as needed for moderate constipation or severe constipation., Disp: 14 each, Rfl: 0 .  senna (SENOKOT) 8.6 MG TABS tablet, Take 1 tablet by mouth as needed. Takes 1 tab every other day , Disp: , Rfl:  .  valsartan (DIOVAN) 160 MG tablet, TAKE 1 TABLET BY MOUTH EVERY DAY, Disp: 90 tablet, Rfl: 1 .  vitamin C (ASCORBIC ACID) 500 MG tablet, Take by mouth., Disp: , Rfl:  .  Magnesium 250 MG TABS, Take 1 tablet (250 mg total) by mouth every evening. With evening meal, Disp: 90 tablet, Rfl: 1   Allergies  Allergen Reactions  . Clindamycin/Lincomycin Palpitations  . Latex Other (See Comments)  . Penicillins Other (See Comments)     Review of Systems  Constitutional: Negative.   Respiratory: Negative.   Cardiovascular: Negative.  Negative for chest pain, palpitations and leg swelling.  Neurological: Negative for dizziness and headaches.  Psychiatric/Behavioral: Negative.      Today's Vitals   04/08/20 1522  BP: 110/72  Pulse: 75  Temp: 98.1 F (36.7 C)  TempSrc: Oral  Weight: 102 lb 9.6 oz (46.5 kg)  PainSc: 0-No pain   Body mass index is 22.2 kg/m.   Objective:  Physical Exam Vitals reviewed.  Constitutional:      General: She is not in acute distress.    Appearance: Normal appearance.  Cardiovascular:     Rate and Rhythm: Normal rate and regular rhythm.     Pulses: Normal pulses.     Heart sounds: Normal heart sounds. No murmur heard.   Pulmonary:     Effort: Pulmonary effort is normal. No respiratory distress.     Breath sounds: Normal breath sounds. No wheezing.  Neurological:     General: No focal deficit present.     Mental Status: She is alert and oriented to person, place, and time.     Cranial Nerves: No cranial nerve deficit.     Comments: Strength is 4/5 left hand and 5/5 right.     Psychiatric:        Mood and Affect: Mood normal.        Behavior: Behavior normal.        Thought Content: Thought content normal.        Judgment: Judgment normal.         Assessment And Plan:     1. Muscle cramps  I have encouraged her to increase her water intake and to take magnesium in the evening.    - Magnesium - BMP8+eGFR - Magnesium 250 MG TABS; Take 1 tablet (250 mg total) by mouth every evening. With evening meal  Dispense: 90 tablet; Refill: 1  2. Essential hypertension, benign Chronic, she is well controlled  Continue with current medications  She did have an elevated BUN at last lab draw, I have encouraged her to increase her water to at least 2 bottles a day to start. This will also help her cramps to her hands - BMP8+eGFR - Magnesium 250 MG TABS; Take 1 tablet (250 mg total) by mouth every evening. With evening meal  Dispense: 90 tablet; Refill: 1  3. Need for influenza vaccination  Influenza vaccine administered  Encouraged to take Tylenol as needed for fever or muscle aches. - Flu Vaccine QUAD 6+ mos PF IM (Fluarix Quad PF)     Patient was given opportunity to ask questions. Patient verbalized understanding of the plan and was able to repeat key elements of the plan. All questions were answered to their satisfaction.   Teola Bradley, FNP, have reviewed all documentation for this visit. The documentation on 04/08/20 for the exam, diagnosis, procedures, and orders are all accurate and complete.  THE PATIENT IS ENCOURAGED TO PRACTICE SOCIAL DISTANCING DUE TO THE COVID-19 PANDEMIC.

## 2020-04-09 LAB — BMP8+EGFR
BUN/Creatinine Ratio: 26 — ABNORMAL HIGH (ref 9–23)
BUN: 19 mg/dL (ref 6–24)
CO2: 22 mmol/L (ref 20–29)
Calcium: 9.6 mg/dL (ref 8.7–10.2)
Chloride: 104 mmol/L (ref 96–106)
Creatinine, Ser: 0.73 mg/dL (ref 0.57–1.00)
GFR calc Af Amer: 107 mL/min/{1.73_m2} (ref >59)
GFR calc non Af Amer: 93 mL/min/{1.73_m2} (ref >59)
Glucose: 78 mg/dL (ref 65–99)
Potassium: 4.3 mmol/L (ref 3.5–5.2)
Sodium: 141 mmol/L (ref 134–144)

## 2020-04-09 LAB — MAGNESIUM: Magnesium: 2 mg/dL (ref 1.6–2.3)

## 2020-04-25 ENCOUNTER — Other Ambulatory Visit: Payer: Self-pay | Admitting: Internal Medicine

## 2020-04-29 DIAGNOSIS — Z4732 Aftercare following explantation of hip joint prosthesis: Secondary | ICD-10-CM | POA: Diagnosis not present

## 2020-04-29 DIAGNOSIS — Z96652 Presence of left artificial knee joint: Secondary | ICD-10-CM | POA: Diagnosis not present

## 2020-04-29 DIAGNOSIS — Z471 Aftercare following joint replacement surgery: Secondary | ICD-10-CM | POA: Diagnosis not present

## 2020-04-29 DIAGNOSIS — Z4733 Aftercare following explantation of knee joint prosthesis: Secondary | ICD-10-CM | POA: Diagnosis not present

## 2020-04-29 DIAGNOSIS — Z96641 Presence of right artificial hip joint: Secondary | ICD-10-CM | POA: Diagnosis not present

## 2020-05-06 ENCOUNTER — Telehealth: Payer: Medicare Other

## 2020-05-12 ENCOUNTER — Telehealth: Payer: Medicare Other

## 2020-05-12 ENCOUNTER — Telehealth: Payer: Self-pay

## 2020-05-12 NOTE — Telephone Encounter (Signed)
  Chronic Care Management   Outreach Note  05/12/2020 Name: Mary Neal MRN: [id number] DOB: [birth year]  Referred by: Glendale Chard, MD Reason for referral : Care Coordination   An unsuccessful telephone outreach was attempted today. The patient was referred to the case management team for assistance with care management and care coordination.   Follow Up Plan: A HIPAA compliant phone message was left for the patient providing contact information and requesting a return call.  The care management team will reach out to the patient again over the next 21 days.   Daneen Schick, BSW, CDP Social Worker, Certified Dementia Practitioner [place] [facility] [phone]

## 2020-06-02 ENCOUNTER — Telehealth: Payer: Medicare Other

## 2020-06-09 ENCOUNTER — Ambulatory Visit (INDEPENDENT_AMBULATORY_CARE_PROVIDER_SITE_OTHER): Payer: Medicare Other

## 2020-06-09 ENCOUNTER — Other Ambulatory Visit: Payer: Self-pay

## 2020-06-09 ENCOUNTER — Telehealth: Payer: Medicare Other

## 2020-06-09 DIAGNOSIS — M1712 Unilateral primary osteoarthritis, left knee: Secondary | ICD-10-CM

## 2020-06-09 DIAGNOSIS — Q059 Spina bifida, unspecified: Secondary | ICD-10-CM

## 2020-06-09 DIAGNOSIS — I10 Essential (primary) hypertension: Secondary | ICD-10-CM | POA: Diagnosis not present

## 2020-06-09 DIAGNOSIS — K219 Gastro-esophageal reflux disease without esophagitis: Secondary | ICD-10-CM

## 2020-06-10 NOTE — Chronic Care Management (AMB) (Signed)
Chronic Care Management   Follow Up Note   06/09/2020 Name: Mary Neal MRN: [id number] DOB: 08/20/1965  Referred by: Mary Chard, MD Reason for referral : Chronic Care Management (Inbound Call from Caregiver )   Mary Neal is a 54 y.o. year old female who is a primary care patient of Mary Chard, MD. The CCM team was consulted for assistance with chronic disease management and care coordination needs.    Review of patient status, including review of consultants reports, relevant laboratory and other test results, and collaboration with appropriate care team members and the patient's provider was performed as part of comprehensive patient evaluation and provision of chronic care management services.    SDOH (Social Determinants of Health) assessments performed: No See Care Plan activities for detailed interventions related to [facility])   Completed inbound call with patient's [name] regarding patient concerns for persistent headaches.     Outpatient Encounter Medications as of 06/09/2020  Medication Sig  . acetaminophen (TYLENOL) 325 MG tablet Take 325 mg by mouth every 6 (six) hours as needed for headache.  . Cholecalciferol 1000 UNITS capsule Take 500 Units by mouth daily.  . famotidine (PEPCID) 20 MG tablet TAKE 1 TABLET BY MOUTH EVERY DAY  . Magnesium 250 MG TABS Take 1 tablet (250 mg total) by mouth every evening. With evening meal  . Multiple Vitamin (MULTI-VITAMINS) TABS Take by mouth.  Marland Kitchen omeprazole (PRILOSEC) 40 MG capsule TAKE 1 CAPSULE BY MOUTH EVERY DAY  . oxybutynin (DITROPAN XL) 15 MG 24 hr tablet Take 15 mg by mouth 2 (two) times daily.   . polyethylene glycol (MIRALAX) packet Take 17 g by mouth daily as needed for moderate constipation or severe constipation.  . senna (SENOKOT) 8.6 MG TABS tablet Take 1 tablet by mouth as needed. Takes 1 tab every other day   . valsartan (DIOVAN) 160 MG tablet TAKE 1 TABLET BY MOUTH EVERY DAY  . vitamin C  (ASCORBIC ACID) 500 MG tablet Take by mouth.   No facility-administered encounter medications on file as of 06/09/2020.     Objective:  No results found for: HGBA1C Lab Results  Component Value Date   LDLCALC 103 (H) 06/27/2019   CREATININE 0.73 04/08/2020   BP Readings from Last 3 Encounters:  04/08/20 110/72  01/07/20 116/82  10/01/19 138/84    Goals Addressed    . "I am concerned about her headaches"       CARE PLAN ENTRY (see longitudinal plan of care for additional care plan information)  Current Barriers:  Marland Kitchen Knowledge Deficits related to evaluation and treatment for persistent headaches  . Chronic Disease Management support and education needs related to Spina bifida without hydrocephalus, unspecified, GERD, Osteoarthritis of left knee, Congenital dysplasia or right hip, HTN  Nurse Case Manager Clinical Goal(s):  Marland Kitchen Over the next 45 days, patient will work with the PCP to address needs related to evaluation and treatment for persistent headaches  CCM RN CM Interventions:  06/09/20 inbound call completed with patient's [name] . Inter-disciplinary care team collaboration (see longitudinal plan of care) . Evaluation of current treatment plan related to persistent headaches and patient's adherence to plan as established by provider . Determined patient's mother is concerned that Mary Neal is c/o persistent headaches, she is unable to identify a trigger and states "the headaches come and go" . Determined [name] reported her concerns to the PCP office and was given instructions by an office member to have Mary Neal take Tylenol for  the headaches . Reviewed medications with patient and discussed patient is taking OTC Acetaminophen and Mucinex for her symptoms with minimal relief, she denies patient any other symptoms related to the headaches . Educated on importance of drinking plenty of water to stay well hydrated; Educated on symptoms suggestive of mild dehydration  including persistent headaches and fatigue . Discussed having Mary Neal check patient's BP and she will call this RNCM with her BP reading when possible . Instructed mother to report new or worsening symptoms promptly . Discussed patient's next PCP OV is scheduled for 07/01/20 @10 :45 with Mary Neal  . Discussed plans with patient for ongoing care management follow up and provided patient with direct contact information for care management team  Patient Self Care Activities:  . Self administers medications as prescribed . Attend all scheduled provider appointments . Call pharmacy for medication refills . Call provider office for new concerns or questions . Supportive mother to assist with care needs   Initial goal documentation       Plan:   Telephone follow up appointment with care management team member scheduled for: 06/12/20  Mary Merino, RN, BSN, CCM Care Management Coordinator [place] [facility]  Direct Phone: [phone]

## 2020-06-10 NOTE — Patient Instructions (Signed)
Visit Information  Goals Addressed    . "I am concerned about her headaches"       CARE PLAN ENTRY (see longitudinal plan of care for additional care plan information)  Current Barriers:  Marland Kitchen Knowledge Deficits related to evaluation and treatment for persistent headaches  . Chronic Disease Management support and education needs related to Spina bifida without hydrocephalus, unspecified, GERD, Osteoarthritis of left knee, Congenital dysplasia or right hip, HTN  Nurse Case Manager Clinical Goal(s):  Marland Kitchen Over the next 45 days, patient will work with the PCP to address needs related to evaluation and treatment for persistent headaches  CCM RN CM Interventions:  06/09/20 inbound call completed with patient's mother [name] . Inter-disciplinary care team collaboration (see longitudinal plan of care) . Evaluation of current treatment plan related to persistent headaches and patient's adherence to plan as established by provider . Determined patient's mother is concerned that Jakyrah is c/o persistent headaches, she is unable to identify a trigger and states "the headaches come and go" . Determined Ms. Banales reported her concerns to the PCP office and was given instructions by an office member to have [name] take Tylenol for the headaches . Reviewed medications with patient and discussed patient is taking OTC Acetaminophen and Mucinex for her symptoms with minimal relief, she denies patient any other symptoms related to the headaches . Educated on importance of drinking plenty of water to stay well hydrated; Educated on symptoms suggestive of mild dehydration including persistent headaches and fatigue . Discussed having [name] check patient's BP and she will call this RNCM with her BP reading when possible . Instructed mother to report new or worsening symptoms promptly . Discussed patient's next PCP OV is scheduled for 07/01/20 @10 :45 with Dr. Baird Cancer  . Discussed plans with patient for  ongoing care management follow up and provided patient with direct contact information for care management team  Patient Self Care Activities:  . Self administers medications as prescribed . Attend all scheduled provider appointments . Call pharmacy for medication refills . Call provider office for new concerns or questions . Supportive mother to assist with care needs   Initial goal documentation       The patient verbalized understanding of instructions, educational materials, and care plan provided today and declined offer to receive copy of patient instructions, educational materials, and care plan.   Telephone follow up appointment with care management team member scheduled for: 06/12/20  Barb Merino, RN, BSN, CCM Care Management Coordinator [facility]  Direct Phone: [phone]

## 2020-06-12 ENCOUNTER — Telehealth: Payer: Self-pay

## 2020-06-12 ENCOUNTER — Telehealth: Payer: Medicare Other

## 2020-06-12 NOTE — Telephone Encounter (Signed)
  Chronic Care Management   Outreach Note  06/12/2020 Name: Mary Neal MRN: [id number] DOB: September 20, 1965  Referred by: Glendale Chard, MD Reason for referral : Care Coordination   A second unsuccessful telephone outreach was attempted today. The patient was referred to the case management team for assistance with care management and care coordination.   Follow Up Plan: A HIPAA compliant phone message was left for the patient providing contact information and requesting a return call.  The care management team will reach out to the patient again over the next month.  Daneen Schick, BSW, CDP Social Worker, Certified Dementia Practitioner [facility] [phone]

## 2020-06-30 ENCOUNTER — Telehealth: Payer: Medicare Other

## 2020-07-01 ENCOUNTER — Other Ambulatory Visit: Payer: Self-pay

## 2020-07-01 ENCOUNTER — Encounter: Payer: Self-pay | Admitting: Internal Medicine

## 2020-07-01 ENCOUNTER — Ambulatory Visit (INDEPENDENT_AMBULATORY_CARE_PROVIDER_SITE_OTHER): Payer: Medicare Other

## 2020-07-01 ENCOUNTER — Ambulatory Visit (INDEPENDENT_AMBULATORY_CARE_PROVIDER_SITE_OTHER): Payer: Medicare Other | Admitting: Internal Medicine

## 2020-07-01 VITALS — BP 132/76 | Temp 97.8°F | Ht <= 58 in | Wt 110.0 lb

## 2020-07-01 VITALS — BP 132/76 | HR 78 | Temp 97.8°F | Ht <= 58 in | Wt 110.2 lb

## 2020-07-01 DIAGNOSIS — N951 Menopausal and female climacteric states: Secondary | ICD-10-CM

## 2020-07-01 DIAGNOSIS — Z23 Encounter for immunization: Secondary | ICD-10-CM | POA: Diagnosis not present

## 2020-07-01 DIAGNOSIS — Q059 Spina bifida, unspecified: Secondary | ICD-10-CM | POA: Diagnosis not present

## 2020-07-01 DIAGNOSIS — I10 Essential (primary) hypertension: Secondary | ICD-10-CM | POA: Diagnosis not present

## 2020-07-01 DIAGNOSIS — Z0000 Encounter for general adult medical examination without abnormal findings: Secondary | ICD-10-CM

## 2020-07-01 DIAGNOSIS — G8220 Paraplegia, unspecified: Secondary | ICD-10-CM | POA: Diagnosis not present

## 2020-07-01 MED ORDER — CLINDAMYCIN HCL 300 MG PO CAPS: ORAL_CAPSULE | ORAL | 1 refills | Status: DC

## 2020-07-01 MED ORDER — SHINGRIX 50 MCG/0.5ML IM SUSR: 0.5000 mL | Freq: Once | INTRAMUSCULAR | 0 refills | Status: AC

## 2020-07-01 NOTE — Progress Notes (Signed)
I,Katawbba Wiggins,acting as a Education administrator for Maximino Greenland, MD.,have documented all relevant documentation on the behalf of Maximino Greenland, MD,as directed by  Maximino Greenland, MD while in the presence of Maximino Greenland, MD.  This visit occurred during the SARS-CoV-2 public health emergency.  Safety protocols were in place, including screening questions prior to the visit, additional usage of staff PPE, and extensive cleaning of exam room while observing appropriate contact time as indicated for disinfecting solutions.  Subjective:     Patient ID: Mary Neal , female    DOB: 02-19-1966 , 54 y.o.   MRN: [id number]   Chief Complaint  Patient presents with  . Hypertension    HPI  She presents today for BP check. She reports compliance with meds. She is accompanied by her Mother today. She is also scheduled for AWV with [facility] Advisor today.   Hypertension This is a chronic problem. The problem has been gradually improving since onset. The problem is controlled. Pertinent negatives include no blurred vision, chest pain, palpitations or shortness of breath. Past treatments include angiotensin blockers. The current treatment provides moderate improvement.     Past Medical History:  Diagnosis Date  . Abnormal Pap smear 2013  . Dislocation of right hip ([place]) 06/09/2018   Per MRI  . History of knee replacement 2005  . Spina bifida ([place])   . Uterine prolapse   . Wears glasses      Family History  Problem Relation Age of Onset  . Hypertension Mother   . Hyperlipidemia Mother   . Heart attack Brother      Current Outpatient Medications:  .  acetaminophen (TYLENOL) 325 MG tablet, Take 325 mg by mouth every 6 (six) hours as needed for headache., Disp: , Rfl:  .  Cholecalciferol 1000 UNITS capsule, Take 500 Units by mouth daily., Disp: , Rfl:  .  [employer] MAGNESIUM OXIDE 250 MG TABS, Take 1 tablet by mouth daily., Disp: , Rfl:  .  famotidine (PEPCID) 20 MG tablet, TAKE 1 TABLET BY MOUTH  EVERY DAY, Disp: 90 tablet, Rfl: 1 .  Magnesium 250 MG TABS, Take 1 tablet (250 mg total) by mouth every evening. With evening meal, Disp: 90 tablet, Rfl: 1 .  Multiple Vitamin (MULTI-VITAMINS) TABS, Take by mouth., Disp: , Rfl:  .  omeprazole (PRILOSEC) 40 MG capsule, TAKE 1 CAPSULE BY MOUTH EVERY DAY, Disp: 90 capsule, Rfl: 1 .  oxybutynin (DITROPAN XL) 15 MG 24 hr tablet, Take 15 mg by mouth 2 (two) times daily. , Disp: , Rfl:  .  polyethylene glycol (MIRALAX) packet, Take 17 g by mouth daily as needed for moderate constipation or severe constipation., Disp: 14 each, Rfl: 0 .  senna (SENOKOT) 8.6 MG TABS tablet, Take 1 tablet by mouth as needed. Takes 1 tab every other day, Disp: , Rfl:  .  valsartan (DIOVAN) 160 MG tablet, TAKE 1 TABLET BY MOUTH EVERY DAY, Disp: 90 tablet, Rfl: 1 .  vitamin C (ASCORBIC ACID) 500 MG tablet, Take by mouth., Disp: , Rfl:  .  Zoster Vaccine Adjuvanted [facility]) injection, Inject 0.5 mLs into the muscle once for 1 dose., Disp: 0.5 mL, Rfl: 0 .  clindamycin (CLEOCIN) 300 MG capsule, TAKE 2 CAPSULES 1 HOUR BEFORE DENTAL APPOINTMENT, Disp: 2 capsule, Rfl: 1   Allergies  Allergen Reactions  . Clindamycin/Lincomycin Palpitations  . Latex Other (See Comments)  . Penicillins Other (See Comments)     Review of Systems  Constitutional: Negative.  Eyes: Negative for blurred vision.  Respiratory: Negative.  Negative for shortness of breath.   Cardiovascular: Negative.  Negative for chest pain and palpitations.  Gastrointestinal: Negative.   Psychiatric/Behavioral: Negative.   All other systems reviewed and are negative.    Today's Vitals   07/01/20 1031  BP: 132/76  Pulse: 78  Temp: 97.8 F (36.6 C)  TempSrc: Oral  Weight: 110 lb 3.2 oz (50 kg)  Height: _0  (1.448 m)  PainSc: 0-No pain   Body mass index is 23.85 kg/m.  Wt Readings from Last 3 Encounters:  07/01/20 110 lb (49.9 kg)  07/01/20 110 lb 3.2 oz (50 kg)  04/08/20 102 lb 9.6 oz (46.5 kg)    Objective:  Physical Exam Vitals and nursing note reviewed.  Constitutional:      Appearance: Normal appearance.  HENT:     Head: Normocephalic and atraumatic.  Cardiovascular:     Rate and Rhythm: Normal rate and regular rhythm.     Heart sounds: Normal heart sounds.  Pulmonary:     Breath sounds: Normal breath sounds.  Skin:    General: Skin is warm.  Neurological:     General: No focal deficit present.     Mental Status: She is alert and oriented to person, place, and time.         Assessment And Plan:     1. Essential hypertension, benign Comments: Chronic, fair control. She is aware that optimal BP is less than 130/80. Encouraged to follow low sodium diet. I will check renal function today.  - CBC no Diff - Lipid panel - BMP8+EGFR  2. Paraparesis ([place]) Comments: Chronic. She is ambulatory with crutches.   3. Spina bifida without hydrocephalus, unspecified spinal region [facility]) Comments: Chronic.   4. Female climacteric state Comments: She is postmenopausal. Requests GYN referral for pelvic exam.  - Ambulatory referral to Gynecology  5. Immunization due Comments: I will send rx Shingrix to her local pharmacy.    Patient was given opportunity to ask questions. Patient verbalized understanding of the plan and was able to repeat key elements of the plan. All questions were answered to their satisfaction.  Maximino Greenland, MD   I, Maximino Greenland, MD, have reviewed all documentation for this visit. The documentation on 07/11/20 for the exam, diagnosis, procedures, and orders are all accurate and complete.  THE PATIENT IS ENCOURAGED TO PRACTICE SOCIAL DISTANCING DUE TO THE COVID-19 PANDEMIC.

## 2020-07-01 NOTE — Patient Instructions (Signed)
Mary Neal , Thank you for taking time to come for your Medicare Wellness Visit. I appreciate your ongoing commitment to your health goals. Please review the following plan we discussed and let me know if I can assist you in the future.   Screening recommendations/referrals: Colonoscopy: completed 05/20/2015 Mammogram: completed 07/02/2019 Bone Density: n/a Recommended yearly ophthalmology/optometry visit for glaucoma screening and checkup Recommended yearly dental visit for hygiene and checkup  Vaccinations: Influenza vaccine: completed 04/08/2020 Pneumococcal vaccine: n/a Tdap vaccine: completed 05/07/2019 Shingles vaccine: sent to pharmacy  Covid-19: 10/15/2019, 09/24/2019  Advanced directives: Advance directive discussed with you today. Even though you declined this today please call our office should you change your mind and we can give you the proper paperwork for you to fill out.  Conditions/risks identified: none  Next appointment: Follow up in one year for your annual wellness visit.   Preventive Care 40-64 Years, Female Preventive care refers to lifestyle choices and visits with your health care provider that can promote health and wellness. What does preventive care include?  A yearly physical exam. This is also called an annual well check.  Dental exams once or twice a year.  Routine eye exams. Ask your health care provider how often you should have your eyes checked.  Personal lifestyle choices, including:  Daily care of your teeth and gums.  Regular physical activity.  Eating a healthy diet.  Avoiding tobacco and drug use.  Limiting alcohol use.  Practicing safe sex.  Taking low-dose aspirin daily starting at age 52.  Taking vitamin and mineral supplements as recommended by your health care provider. What happens during an annual well check? The services and screenings done by your health care provider during your annual well check will depend on your  age, overall health, lifestyle risk factors, and family history of disease. Counseling  Your health care provider may ask you questions about your:  Alcohol use.  Tobacco use.  Drug use.  Emotional well-being.  Home and relationship well-being.  Sexual activity.  Eating habits.  Work and work Statistician.  Method of birth control.  Menstrual cycle.  Pregnancy history. Screening  You may have the following tests or measurements:  Height, weight, and BMI.  Blood pressure.  Lipid and cholesterol levels. These may be checked every 5 years, or more frequently if you are over 46 years old.  Skin check.  Lung cancer screening. You may have this screening every year starting at age 44 if you have a 30-pack-year history of smoking and currently smoke or have quit within the past 15 years.  Fecal occult blood test (FOBT) of the stool. You may have this test every year starting at age 9.  Flexible sigmoidoscopy or colonoscopy. You may have a sigmoidoscopy every 5 years or a colonoscopy every 10 years starting at age 76.  Hepatitis C blood test.  Hepatitis B blood test.  Sexually transmitted disease (STD) testing.  Diabetes screening. This is done by checking your blood sugar (glucose) after you have not eaten for a while (fasting). You may have this done every 1-3 years.  Mammogram. This may be done every 1-2 years. Talk to your health care provider about when you should start having regular mammograms. This may depend on whether you have a family history of breast cancer.  BRCA-related cancer screening. This may be done if you have a family history of breast, ovarian, tubal, or peritoneal cancers.  Pelvic exam and Pap test. This may be done every 3 years  starting at age 95. Starting at age 1, this may be done every 5 years if you have a Pap test in combination with an HPV test.  Bone density scan. This is done to screen for osteoporosis. You may have this scan if you  are at high risk for osteoporosis. Discuss your test results, treatment options, and if necessary, the need for more tests with your health care provider. Vaccines  Your health care provider may recommend certain vaccines, such as:  Influenza vaccine. This is recommended every year.  Tetanus, diphtheria, and acellular pertussis (Tdap, Td) vaccine. You may need a Td booster every 10 years.  Zoster vaccine. You may need this after age 96.  Pneumococcal 13-valent conjugate (PCV13) vaccine. You may need this if you have certain conditions and were not previously vaccinated.  Pneumococcal polysaccharide (PPSV23) vaccine. You may need one or two doses if you smoke cigarettes or if you have certain conditions. Talk to your health care provider about which screenings and vaccines you need and how often you need them. This information is not intended to replace advice given to you by your health care provider. Make sure you discuss any questions you have with your health care provider. Document Released: 07/24/2015 Document Revised: 03/16/2016 Document Reviewed: 04/28/2015 Elsevier Interactive Patient Education  2017 [place] Prevention in the Home Falls can cause injuries. They can happen to people of all ages. There are many things you can do to make your home safe and to help prevent falls. What can I do on the outside of my home?  Regularly fix the edges of walkways and driveways and fix any cracks.  Remove anything that might make you trip as you walk through a door, such as a raised step or threshold.  Trim any bushes or trees on the path to your home.  Use bright outdoor lighting.  Clear any walking paths of anything that might make someone trip, such as rocks or tools.  Regularly check to see if handrails are loose or broken. Make sure that both sides of any steps have handrails.  Any raised decks and porches should have guardrails on the edges.  Have any leaves,  snow, or ice cleared regularly.  Use sand or salt on walking paths during winter.  Clean up any spills in your garage right away. This includes oil or grease spills. What can I do in the bathroom?  Use night lights.  Install grab bars by the toilet and in the tub and shower. Do not use towel bars as grab bars.  Use non-skid mats or decals in the tub or shower.  If you need to sit down in the shower, use a plastic, non-slip stool.  Keep the floor dry. Clean up any water that spills on the floor as soon as it happens.  Remove soap buildup in the tub or shower regularly.  Attach bath mats securely with double-sided non-slip rug tape.  Do not have throw rugs and other things on the floor that can make you trip. What can I do in the bedroom?  Use night lights.  Make sure that you have a light by your bed that is easy to reach.  Do not use any sheets or blankets that are too big for your bed. They should not hang down onto the floor.  Have a firm chair that has side arms. You can use this for support while you get dressed.  Do not have throw rugs and  other things on the floor that can make you trip. What can I do in the kitchen?  Clean up any spills right away.  Avoid walking on wet floors.  Keep items that you use a lot in easy-to-reach places.  If you need to reach something above you, use a strong step stool that has a grab bar.  Keep electrical cords out of the way.  Do not use floor polish or wax that makes floors slippery. If you must use wax, use non-skid floor wax.  Do not have throw rugs and other things on the floor that can make you trip. What can I do with my stairs?  Do not leave any items on the stairs.  Make sure that there are handrails on both sides of the stairs and use them. Fix handrails that are broken or loose. Make sure that handrails are as long as the stairways.  Check any carpeting to make sure that it is firmly attached to the stairs. Fix any  carpet that is loose or worn.  Avoid having throw rugs at the top or bottom of the stairs. If you do have throw rugs, attach them to the floor with carpet tape.  Make sure that you have a light switch at the top of the stairs and the bottom of the stairs. If you do not have them, ask someone to add them for you. What else can I do to help prevent falls?  Wear shoes that:  Do not have high heels.  Have rubber bottoms.  Are comfortable and fit you well.  Are closed at the toe. Do not wear sandals.  If you use a stepladder:  Make sure that it is fully opened. Do not climb a closed stepladder.  Make sure that both sides of the stepladder are locked into place.  Ask someone to hold it for you, if possible.  Clearly mark and make sure that you can see:  Any grab bars or handrails.  First and last steps.  Where the edge of each step is.  Use tools that help you move around (mobility aids) if they are needed. These include:  Canes.  Walkers.  Scooters.  Crutches.  Turn on the lights when you go into a dark area. Replace any light bulbs as soon as they burn out.  Set up your furniture so you have a clear path. Avoid moving your furniture around.  If any of your floors are uneven, fix them.  If there are any pets around you, be aware of where they are.  Review your medicines with your doctor. Some medicines can make you feel dizzy. This can increase your chance of falling. Ask your doctor what other things that you can do to help prevent falls. This information is not intended to replace advice given to you by your health care provider. Make sure you discuss any questions you have with your health care provider. Document Released: 04/23/2009 Document Revised: 12/03/2015 Document Reviewed: 08/01/2014 Elsevier Interactive Patient Education  2017 [employer].

## 2020-07-01 NOTE — Progress Notes (Signed)
This visit occurred during the SARS-CoV-2 public health emergency.  Safety protocols were in place, including screening questions prior to the visit, additional usage of staff PPE, and extensive cleaning of exam room while observing appropriate contact time as indicated for disinfecting solutions.  Subjective:   Mary Neal is a 54 y.o. female who presents for Medicare Annual (Subsequent) preventive examination.  Review of Systems     Cardiac Risk Factors include: sedentary lifestyle     Objective:    Today's Vitals   07/01/20 1121  BP: 132/76  Temp: 97.8 F (36.6 C)  TempSrc: Oral  Weight: 110 lb (49.9 kg)  Height: 4\' 9"  (1.448 m)   Body mass index is 23.8 kg/m.  Advanced Directives 07/01/2020 06/27/2019 01/11/2016 10/14/2015 08/07/2015 06/12/2015 05/15/2015  Does Patient Have a Medical Advance Directive? No Yes No No No No No  Type of Advance Directive - Healthcare Power of [place];Living will - - - - -  Copy of [place] in Chart? - No - copy requested - - - - -  Would patient like information on creating a medical advance directive? - - Yes - Scientist, clinical (histocompatibility and immunogenetics) given Yes - Scientist, clinical (histocompatibility and immunogenetics) given Yes - Educational materials given - -    Current Medications (verified) Outpatient Encounter Medications as of 07/01/2020  Medication Sig  . acetaminophen (TYLENOL) 325 MG tablet Take 325 mg by mouth every 6 (six) hours as needed for headache.  . Cholecalciferol 1000 UNITS capsule Take 500 Units by mouth daily.  . clindamycin (CLEOCIN) 300 MG capsule TAKE 2 CAPSULES 1 HOUR BEFORE DENTAL APPOINTMENT  . [employer] MAGNESIUM OXIDE 250 MG TABS Take 1 tablet by mouth daily.  . famotidine (PEPCID) 20 MG tablet TAKE 1 TABLET BY MOUTH EVERY DAY  . Magnesium 250 MG TABS Take 1 tablet (250 mg total) by mouth every evening. With evening meal  . Multiple Vitamin (MULTI-VITAMINS) TABS Take by mouth.  Marland Kitchen omeprazole (PRILOSEC) 40 MG capsule TAKE 1 CAPSULE BY MOUTH EVERY DAY   . oxybutynin (DITROPAN XL) 15 MG 24 hr tablet Take 15 mg by mouth 2 (two) times daily.   . polyethylene glycol (MIRALAX) packet Take 17 g by mouth daily as needed for moderate constipation or severe constipation.  . senna (SENOKOT) 8.6 MG TABS tablet Take 1 tablet by mouth as needed. Takes 1 tab every other day  . valsartan (DIOVAN) 160 MG tablet TAKE 1 TABLET BY MOUTH EVERY DAY  . vitamin C (ASCORBIC ACID) 500 MG tablet Take by mouth.   No facility-administered encounter medications on file as of 07/01/2020.    Allergies (verified) Clindamycin/lincomycin, Latex, and Penicillins   History: Past Medical History:  Diagnosis Date  . Abnormal Pap smear 2013  . Dislocation of right hip ([place]) 06/09/2018   Per MRI  . History of knee replacement 2005  . Spina bifida ([place])   . Uterine prolapse   . Wears glasses    Past Surgical History:  Procedure Laterality Date  . TOTAL HIP ARTHROPLASTY Right 12/2018   Family History  Problem Relation Age of Onset  . Hypertension Mother   . Hyperlipidemia Mother   . Heart attack Brother    Social History   Socioeconomic History  . Marital status: Single    Spouse name: Not on file  . Number of children: 0  . Years of education: Not on file  . Highest education level: Not on file  Occupational History  . Occupation: unemployed  Tobacco Use  .  Smoking status: Never Smoker  . Smokeless tobacco: Never Used  Vaping Use  . Vaping Use: Never used  Substance and Sexual Activity  . Alcohol use: No    Alcohol/week: 0.0 standard drinks  . Drug use: No  . Sexual activity: Never  Other Topics Concern  . Not on file  Social History Narrative   ** Merged History Encounter **       Social Determinants of Health   Financial Resource Strain: Low Risk   . Difficulty of Paying Living Expenses: Not hard at all  Food Insecurity: No Food Insecurity  . Worried About Charity fundraiser in the Last Year: Never true  . Ran Out of Food in the Last  Year: Never true  Transportation Needs: No Transportation Needs  . Lack of Transportation (Medical): No  . Lack of Transportation (Non-Medical): No  Physical Activity: Inactive  . Days of Exercise per Week: 0 days  . Minutes of Exercise per Session: 0 min  Stress: No Stress Concern Present  . Feeling of Stress : Not at all  Social Connections: Moderately Isolated  . Frequency of Communication with Friends and Family: More than three times a week  . Frequency of Social Gatherings with Friends and Family: Never  . Attends Religious Services: 1 to 4 times per year  . Active Member of Clubs or Organizations: No  . Attends Archivist Meetings: Never  . Marital Status: Never married    Tobacco Counseling Counseling given: Not Answered   Clinical Intake:  Pre-visit preparation completed: Yes  Pain : No/denies pain     Nutritional Risks: None Diabetes: No  How often do you need to have someone help you when you read instructions, pamphlets, or other written materials from your doctor or pharmacy?: 1 - Never What is the last grade level you completed in school?: 12th grade  Diabetic? no  Interpreter Needed?: No  Information entered by :: NAllen LPN   Activities of Daily Living In your present state of health, do you have any difficulty performing the following activities: 07/01/2020  Hearing? N  Vision? N  Difficulty concentrating or making decisions? N  Walking or climbing stairs? Y  Dressing or bathing? Y  Comment has aide  Doing errands, shopping? Y  Preparing Food and eating ? N  Using the Toilet? N  In the past six months, have you accidently leaked urine? Y  Do you have problems with loss of bowel control? Y  Managing your Medications? N  Managing your Finances? N  Housekeeping or managing your Housekeeping? N  Some recent data might be hidden    Patient Care Team: Glendale Chard, MD as PCP - General (Internal Medicine) Rex Kras, Claudette Stapler, RN as  Case Manager Daneen Schick as Social Worker  Indicate any recent Medical Services you may have received from other than [facility] providers in the past year (date may be approximate).     Assessment:   This is a routine wellness examination for [name].  Hearing/Vision screen No exam data present  Dietary issues and exercise activities discussed: Current Exercise Habits: The patient does not participate in regular exercise at present  Goals    .  "I am concerned about her headaches"      CARE PLAN ENTRY (see longitudinal plan of care for additional care plan information)  Current Barriers:  Marland Kitchen Knowledge Deficits related to evaluation and treatment for persistent headaches  . Chronic Disease Management support and education needs related to  Spina bifida without hydrocephalus, unspecified, GERD, Osteoarthritis of left knee, Congenital dysplasia or right hip, HTN  Nurse Case Manager Clinical Goal(s):  Marland Kitchen Over the next 45 days, patient will work with the PCP to address needs related to evaluation and treatment for persistent headaches  CCM RN CM Interventions:  06/09/20 inbound call completed with patient's mother [name] . Inter-disciplinary care team collaboration (see longitudinal plan of care) . Evaluation of current treatment plan related to persistent headaches and patient's adherence to plan as established by provider . Determined patient's mother is concerned that Bina is c/o persistent headaches, she is unable to identify a trigger and states "the headaches come and go" . Determined Ms. Quade reported her concerns to the PCP office and was given instructions by an office member to have [name] take Tylenol for the headaches . Reviewed medications with patient and discussed patient is taking OTC Acetaminophen and Mucinex for her symptoms with minimal relief, she denies patient any other symptoms related to the headaches . Educated on importance of drinking plenty of water to  stay well hydrated; Educated on symptoms suggestive of mild dehydration including persistent headaches and fatigue . Discussed having [name] check patient's BP and she will call this RNCM with her BP reading when possible . Instructed mother to report new or worsening symptoms promptly . Discussed patient's next PCP OV is scheduled for 07/01/20 @10 :45 with Dr. Baird Cancer  . Discussed plans with patient for ongoing care management follow up and provided patient with direct contact information for care management team  Patient Self Care Activities:  . Self administers medications as prescribed . Attend all scheduled provider appointments . Call pharmacy for medication refills . Call provider office for new concerns or questions . Supportive mother to assist with care needs   Initial goal documentation     .  "I need help contacting the home health agency"      Mother stated  [place] (see longitudinal plan of care for additional care plan information)  Current Barriers:  Marland Kitchen Knowledge Deficits related to referred agency to provide in home PT services  . Chronic Disease Management support and education needs related to Spina bifida without hydrocephalus, unspecified, GERD, Osteoarthritis of left knee, Congenital dysplasia or right hip, HTN  Nurse Case Manager Clinical Goal(s):  Marland Kitchen Over the next 30 days, patient will work with [place] provider  to address needs related to PT needs for strengthening and endurance  . Over the next 90 days, patient will work with the CCM team and PCP for disease education and support to help improve/maintain physical mobility   CCM RN CM Interventions:  01/28/20 call completed with mother [name] . Inter-disciplinary care team collaboration (see longitudinal plan of care) . Evaluation of current treatment plan related to Impaired Physical Mobility and patient's adherence to plan as established by provider . Determined Ms. Watler received a call  from a [place] agency but declined the service due to not understanding Dr. Baird Cancer recently prescribed in home PT for the patient  . Reviewed and discussed Dr. Baird Cancer sent an in home PT referral to assist patient with strengthening and endurance . Determined the referral was sent to [facility] a provider the patient had used in the past  . Placed outbound joint call to [facility], spoke with [place] intake coordinator, she advised Ms. Powderly the referral was received and she will contact her later today in order to schedule the initial visit, Ms. Pettit  verbalizes understanding and is agreeable  . Discussed plans with patient for ongoing care management follow up and provided patient with direct contact information for care management team  Patient Self Care Activities:  . Self administers medications as prescribed . Attends all scheduled provider appointments . Calls pharmacy for medication refills . Calls provider office for new concerns or questions . Supportive mother to assist with care needs  Initial goal documentation     .  "to have her hand and arm pain evaluated" (pt-stated)      CARE PLAN ENTRY (see longitudinal plan of care for additional care plan information)  Current Barriers:  Marland Kitchen Knowledge Deficits related to evaluation and treatment of bilateral hand and arm pain . Chronic Disease Management support and education needs related to Spina bifida without hydrocephalus, unspecified, GERD, Osteoarthritis of left knee, Congenital dysplasia or right hip, HTN  Nurse Case Manager Clinical Goal(s):  Marland Kitchen Over the next 30 days, patient will work with PCP to address needs related to evaluation and treatment of bilateral hand and arm pain   CCM RN CM Interventions:  03/25/20 call completed with mother [name] . Inter-disciplinary care team collaboration (see longitudinal plan of care) . Evaluation of current treatment plan related to acute bilateral  hand/arm pain and patient's adherence to plan as established by provider . Determined patient is experiencing bilateral hand and arm pain that is sudden and intermittent causing her moderate to severe discomfort . Determined patient would like an OV scheduled to have these symptoms evaluated by Dr. Baird Cancer . Collaborated with PCP Dr. Glendale Chard via secure message regarding patient's reported symptoms and request for an OV for evaluation and treatment of these symptoms . Discussed plans with patient for ongoing care management follow up and provided patient with direct contact information for care management team  Patient Self Care Activities:  . Self administers medications as prescribed . Attends all scheduled provider appointments . Calls pharmacy for medication refills . Calls provider office for new concerns or questions . Supportive mother to assist with care needs  Initial goal documentation     .  Patient Stated      07/01/2020, wants to stay small    .  Weight (lb) < 200 lb (90.7 kg)      06/27/2019, wants to weigh 105 pounds      Depression Screen PHQ 2/9 Scores 07/01/2020 01/06/2020 06/27/2019 04/15/2019 01/08/2019 04/13/2015 03/12/2015  PHQ - 2 Score 0 0 0 0 0 2 0  PHQ- 9 Score - - 0 - - - 2    Fall Risk Fall Risk  07/01/2020 01/06/2020 06/27/2019 04/15/2019 02/28/2019  Falls in the past year? 1 0 1 1 1   Comment slipped on the floor - - - -  Number falls in past yr: 0 0 1 1 1   Injury with Fall? 0 0 0 1 0  Risk for fall due to : History of fall(s);Impaired balance/gait;Impaired mobility;Medication side effect - History of fall(s);Medication side effect - -  Risk for fall due to: Comment - - - - -  Follow up Falls evaluation completed;Education provided;Falls prevention discussed - Falls evaluation completed;Education provided;Falls prevention discussed - -    FALL RISK PREVENTION PERTAINING TO THE HOME:  Any stairs in or around the home? No  If so, are there any without  handrails? n/a Home free of loose throw rugs in walkways, pet beds, electrical cords, etc? Yes  Adequate lighting in your home to reduce risk of falls? Yes  ASSISTIVE DEVICES UTILIZED TO PREVENT FALLS:  Life alert? No  Use of a cane, walker or w/c? Yes  Grab bars in the bathroom? Yes  Shower chair or bench in shower? Yes  Elevated toilet seat or a handicapped toilet? Yes   TIMED UP AND GO:  Was the test performed? No . .   Gait slow and steady with assistive device  Cognitive Function:     6CIT Screen 07/01/2020 06/27/2019  What Year? 0 points 0 points  What month? 0 points 0 points  What time? 0 points 0 points  Count back from 20 4 points 0 points  Months in reverse 4 points 4 points  Repeat phrase 0 points 10 points  Total Score 8 14    Immunizations Immunization History  Administered Date(s) Administered  . Influenza,inj,Quad PF,6+ Mos 06/19/2018, 04/15/2019, 04/08/2020  . PFIZER SARS-COV-2 Vaccination 09/24/2019, 10/15/2019  . Tdap 05/07/2019    TDAP status: Up to date  Flu Vaccine status: Up to date  Pneumococcal vaccine status: Up to date  Covid-19 vaccine status: Completed vaccines  Qualifies for Shingles Vaccine? Yes   Zostavax completed No   Shingrix Completed?: No.    Education has been provided regarding the importance of this vaccine. Patient has been advised to call insurance company to determine out of pocket expense if they have not yet received this vaccine. Advised may also receive vaccine at local pharmacy or Health Dept. Verbalized acceptance and understanding.  Screening Tests Health Maintenance  Topic Date Due  . HIV Screening  Never done  . PAP SMEAR-Modifier  Never done  . COVID-19 Vaccine (3 - Booster for Pfizer series) 04/15/2020  . MAMMOGRAM  07/01/2021  . COLONOSCOPY  05/19/2025  . TETANUS/TDAP  05/06/2029  . INFLUENZA VACCINE  Completed  . Hepatitis C Screening  Completed    Health Maintenance  Health Maintenance Due   Topic Date Due  . HIV Screening  Never done  . PAP SMEAR-Modifier  Never done  . COVID-19 Vaccine (3 - Booster for Pfizer series) 04/15/2020    Colorectal cancer screening: Type of screening: Colonoscopy. Completed 05/20/2015. Repeat every 10 years  Mammogram status: Completed 07/02/2019. Repeat every year  Bone Density status: n/a  Lung Cancer Screening: (Low Dose CT Chest recommended if Age 64-80 years, 30 pack-year currently smoking OR have quit w/in 15years.) does not qualify.   Lung Cancer Screening Referral: no  Additional Screening:  Hepatitis C Screening: does qualify; Completed 01/07/2020  Vision Screening: Recommended annual ophthalmology exams for early detection of glaucoma and other disorders of the eye. Is the patient up to date with their annual eye exam?  Yes  Who is the provider or what is the name of the office in which the patient attends annual eye exams? Dr. Katy Fitch If pt is not established with a provider, would they like to be referred to a provider to establish care? No .   Dental Screening: Recommended annual dental exams for proper oral hygiene  Community Resource Referral / Chronic Care Management: CRR required this visit?  No   CCM required this visit?  No      Plan:     I have personally reviewed and noted the following in the patient's chart:   . Medical and social history . Use of alcohol, tobacco or illicit drugs  . Current medications and supplements . Functional ability and status . Nutritional status . Physical activity . Advanced directives . List of other physicians . Hospitalizations, surgeries, and ER  visits in previous 12 months . Vitals . Screenings to include cognitive, depression, and falls . Referrals and appointments  In addition, I have reviewed and discussed with patient certain preventive protocols, quality metrics, and best practice recommendations. A written personalized care plan for preventive services as well as  general preventive health recommendations were provided to patient.     Kellie Simmering, LPN   [id number]   Nurse Notes:

## 2020-07-02 ENCOUNTER — Telehealth: Payer: Medicare Other

## 2020-07-02 DIAGNOSIS — N319 Neuromuscular dysfunction of bladder, unspecified: Secondary | ICD-10-CM | POA: Diagnosis not present

## 2020-07-02 DIAGNOSIS — N139 Obstructive and reflux uropathy, unspecified: Secondary | ICD-10-CM | POA: Diagnosis not present

## 2020-07-02 DIAGNOSIS — N3020 Other chronic cystitis without hematuria: Secondary | ICD-10-CM | POA: Diagnosis not present

## 2020-07-02 LAB — BMP8+EGFR
BUN/Creatinine Ratio: 35 — ABNORMAL HIGH (ref 9–23)
BUN: 19 mg/dL (ref 6–24)
CO2: 24 mmol/L (ref 20–29)
Calcium: 10.1 mg/dL (ref 8.7–10.2)
Chloride: 101 mmol/L (ref 96–106)
Creatinine, Ser: 0.55 mg/dL — ABNORMAL LOW (ref 0.57–1.00)
GFR calc Af Amer: 122 mL/min/{1.73_m2} (ref >59)
GFR calc non Af Amer: 106 mL/min/{1.73_m2} (ref >59)
Glucose: 69 mg/dL (ref 65–99)
Potassium: 4.1 mmol/L (ref 3.5–5.2)
Sodium: 139 mmol/L (ref 134–144)

## 2020-07-02 LAB — LIPID PANEL
Chol/HDL Ratio: 2.2 ratio (ref 0.0–4.4)
Cholesterol, Total: 216 mg/dL — ABNORMAL HIGH (ref 100–199)
HDL: 99 mg/dL (ref >39)
LDL Chol Calc (NIH): 110 mg/dL — ABNORMAL HIGH (ref 0–99)
Triglycerides: 39 mg/dL (ref 0–149)
VLDL Cholesterol Cal: 7 mg/dL (ref 5–40)

## 2020-07-02 LAB — CBC
Hematocrit: 39.3 % (ref 34.0–46.6)
Hemoglobin: 13.6 g/dL (ref 11.1–15.9)
MCH: 31.8 pg (ref 26.6–33.0)
MCHC: 34.6 g/dL (ref 31.5–35.7)
MCV: 92 fL (ref 79–97)
Platelets: 166 10*3/uL (ref 150–450)
RBC: 4.28 x10E6/uL (ref 3.77–5.28)
RDW: 11.8 % (ref 11.7–15.4)
WBC: 5.9 10*3/uL (ref 3.4–10.8)

## 2020-07-08 DIAGNOSIS — Z1231 Encounter for screening mammogram for malignant neoplasm of breast: Secondary | ICD-10-CM | POA: Diagnosis not present

## 2020-07-08 LAB — HM MAMMOGRAPHY

## 2020-07-11 MED ORDER — SHINGRIX 50 MCG/0.5ML IM SUSR: 0.5000 mL | Freq: Once | INTRAMUSCULAR | 0 refills | Status: AC

## 2020-07-13 ENCOUNTER — Telehealth: Payer: Medicare Other

## 2020-07-13 ENCOUNTER — Encounter: Payer: Self-pay | Admitting: Internal Medicine

## 2020-07-13 ENCOUNTER — Telehealth: Payer: Self-pay

## 2020-07-13 DIAGNOSIS — Z23 Encounter for immunization: Secondary | ICD-10-CM | POA: Diagnosis not present

## 2020-07-13 NOTE — Telephone Encounter (Signed)
  Chronic Care Management   Outreach Note  07/13/2020 Name: Mary Neal MRN: [id number] DOB: [birth year]  Referred by: Dorothyann Peng, MD Reason for referral : Care Coordination   [name] is enrolled in a Managed Medicaid Health Plan: No  Third unsuccessful telephone outreach was attempted today. The patient was referred to the case management team for assistance with care management and care coordination. The patient's primary care provider has been notified of our unsuccessful attempts to make or maintain contact with the patient. The care management team is pleased to engage with this patient at any time in the future should he/she be interested in assistance from the care management team.   Follow Up Plan: No further SW follow up planned at this time. The patient will remain active with RN Care Manager.  Bevelyn Ngo, BSW, CDP Social Worker, Certified Dementia Practitioner [facility] Care Management [phone]

## 2020-07-14 ENCOUNTER — Encounter: Payer: Self-pay | Admitting: Orthopaedic Surgery

## 2020-07-14 ENCOUNTER — Ambulatory Visit (INDEPENDENT_AMBULATORY_CARE_PROVIDER_SITE_OTHER): Payer: Medicare Other | Admitting: Orthopaedic Surgery

## 2020-07-14 DIAGNOSIS — Q059 Spina bifida, unspecified: Secondary | ICD-10-CM

## 2020-07-14 DIAGNOSIS — Q6589 Other specified congenital deformities of hip: Secondary | ICD-10-CM | POA: Diagnosis not present

## 2020-07-14 DIAGNOSIS — R29898 Other symptoms and signs involving the musculoskeletal system: Secondary | ICD-10-CM | POA: Diagnosis not present

## 2020-07-14 NOTE — Progress Notes (Signed)
The patient has a history of spina bifida.  She has profound bilateral lower extremity weakness.  She is 55 years old.  She does wear long leg hinged braces that are double upright in the shoes as well.  This allows her to mobilize with bilateral arm crutches.  She has had no other acute change in her medical status.  She has been doing well overall but her leg braces are wearing out and she needs referral back to [facility] to have new braces made for her legs.  Her old braces do seem to be worn with both legs.  The Velcro is wearing out and it looks like she has had these braces for some time and is in need of knee braces.  I gave her prescription for [employer] for these and will defer to their care in terms of getting her knee bilateral leg braces.  This is appropriate given her longstanding bilateral leg weakness from her spina bifida.

## 2020-07-17 ENCOUNTER — Ambulatory Visit: Payer: Self-pay

## 2020-07-17 ENCOUNTER — Telehealth: Payer: Medicare Other

## 2020-07-17 ENCOUNTER — Other Ambulatory Visit: Payer: Self-pay

## 2020-07-17 DIAGNOSIS — Q059 Spina bifida, unspecified: Secondary | ICD-10-CM

## 2020-07-17 DIAGNOSIS — M1712 Unilateral primary osteoarthritis, left knee: Secondary | ICD-10-CM

## 2020-07-17 DIAGNOSIS — I10 Essential (primary) hypertension: Secondary | ICD-10-CM

## 2020-07-17 DIAGNOSIS — Q6589 Other specified congenital deformities of hip: Secondary | ICD-10-CM

## 2020-07-17 DIAGNOSIS — K219 Gastro-esophageal reflux disease without esophagitis: Secondary | ICD-10-CM

## 2020-07-21 NOTE — Patient Instructions (Signed)
Visit Information Goals Addressed      Patient Stated   .  COMPLETED: "to have her hand and arm pain evaluated" (pt-stated)        CARE PLAN ENTRY (see longitudinal plan of care for additional care plan information)  Current Barriers:  Marland Kitchen Knowledge Deficits related to evaluation and treatment of bilateral hand and arm pain . Chronic Disease Management support and education needs related to Spina bifida without hydrocephalus, unspecified, GERD, Osteoarthritis of left knee, Congenital dysplasia or right hip, HTN  Nurse Case Manager Clinical Goal(s):  Marland Kitchen Over the next 30 days, patient will work with PCP to address needs related to evaluation and treatment of bilateral hand and arm pain   CCM RN CM Interventions:  07/17/20 call completed with mother [name] . Inter-disciplinary care team collaboration (see longitudinal plan of care) . Evaluation of current treatment plan related to acute bilateral hand/arm pain and patient's adherence to plan as established by provider . Determined patient is no longer experiencing bilateral hand and arm pain and no further interventions are needed . Advised mother to notify Dr. Baird Cancer if symptoms reoccure . Discussed plans with patient for ongoing care management follow up and provided patient with direct contact information for care management team  Patient Self Care Activities:  . Self administers medications as prescribed . Attend all scheduled provider appointments . Call pharmacy for medication refills . Call provider office for new concerns or questions  Please see past updates related to this goal by clicking on the "Past Updates" button in the selected goal        Other   .  COMPLETED: "I am concerned about her headaches"        CARE PLAN ENTRY (see longitudinal plan of care for additional care plan information)  Current Barriers:  Marland Kitchen Knowledge Deficits related to evaluation and treatment for persistent headaches  . Chronic Disease Management  support and education needs related to Spina bifida without hydrocephalus, unspecified, GERD, Osteoarthritis of left knee, Congenital dysplasia or right hip, HTN  Nurse Case Manager Clinical Goal(s):  Marland Kitchen Over the next 45 days, patient will work with the PCP to address needs related to evaluation and treatment for persistent headaches  CCM RN CM Interventions:  07/17/20 call completed with patient's mother [name] . Inter-disciplinary care team collaboration (see longitudinal plan of care) . Evaluation of current treatment plan related to persistent headaches and patient's adherence to plan as established by provider . Determined patient's headaches have resolved and no further interventions are needed at this time . Discussed plans with patient for ongoing care management follow up and provided patient with direct contact information for care management team  Patient Self Care Activities:  . Self administer medications as prescribed . Attends all scheduled provider appointments . Call pharmacy for medication refills . Call provider office for new concerns or questions  Please see past updates related to this goal by clicking on the "Past Updates" button in the selected goal      .  COMPLETED: "I need help contacting the home health agency"        Mother stated  [place] (see longitudinal plan of care for additional care plan information)  Current Barriers:  Marland Kitchen Knowledge Deficits related to referred agency to provide in home PT services  . Chronic Disease Management support and education needs related to Spina bifida without hydrocephalus, unspecified, GERD, Osteoarthritis of left knee, Congenital dysplasia or right hip, HTN  Nurse Case Manager Clinical  Goal(s):  Marland Kitchen Over the next 30 days, patient will work with [place] provider  to address needs related to PT needs for strengthening and endurance  . Over the next 90 days, patient will work with the CCM team and PCP for disease  education and support to help improve/maintain physical mobility   CCM RN CM Interventions:  07/17/20 call completed with mother [name] . Inter-disciplinary care team collaboration (see longitudinal plan of care) . Evaluation of current treatment plan related to Impaired Physical Mobility and patient's adherence to plan as established by provider . Determined patient completed [facility] services with [facility] in late September  . Determined patient currently has no further [place] needs and continues to have a CNA assist with personal care needs  . Discussed plans with patient for ongoing care management follow up and provided patient with direct contact information for care management team  Patient Self Care Activities:  . Self administer medications as prescribed . Attend all scheduled provider appointments . Call pharmacy for medication refills . Call provider office for new concerns or questions  Please see past updates related to this goal by clicking on the "Past Updates" button in the selected goal      Patient Care Plan: Hypertension (Adult)    Problem Identified: Disease Progression (Hypertension)   Priority: High    Long-Range Goal: Disease Progression Prevented or Minimized   Start Date: 07/17/2020  Expected End Date: 10/15/2020  This Visit's Progress: On track  Priority: High  Note:   Objective:  . Last practice recorded BP readings:  BP Readings from Last 3 Encounters:  07/01/20 132/76  07/01/20 132/76  04/08/20 110/72 .   Marland Kitchen Most recent eGFR/CrCl: No results found for: EGFR  No components found for: CRCL Current Barriers:  Marland Kitchen Knowledge Deficits related to basic understanding of hypertension pathophysiology and self care management . Knowledge Deficits related to understanding of medications prescribed for management of hypertension . Lacks social connections . Unable to perform ADLs independently . Unable to perform IADLs independently Case Manager Clinical Goal(s):  Marland Kitchen Over  the next 90 days, patient will verbalize basic understanding of hypertension disease process and self health management plan as evidenced by patient will maintain blood pressure at target range less than 130/80 and will not experience complications secondary to uncontrolled hypertension  Interventions:  . Evaluation of current treatment plan related to hypertension self management and patient's adherence to plan as established by provider . Assess for and promote awareness of worsening disease or development of comorbidity.  . Reviewed medications with patient and discussed importance of compliance . Advised patient, providing education and rationale, to monitor blood pressure daily and record, calling PCP for findings outside established parameters.  . Discussed plans with patient for ongoing care management follow up and provided patient with direct contact information for care management team Patient Goals/Self-Care Activities . Over the next 90 days, patient will:  - Self administers medications as prescribed Attends all scheduled provider appointments Calls provider office for new concerns, questions, or BP outside discussed parameters Checks BP and records as discussed Follows a low sodium diet/DASH diet check blood pressure 3 times per week write blood pressure results in a log or diary   Follow Up Plan: Telephone follow up appointment with care management team member scheduled for: 10/09/20     The patient verbalized understanding of instructions, educational materials, and care plan provided today and declined offer to receive copy of patient instructions, educational materials, and care plan.  Telephone follow up appointment with care management team member scheduled for: 10/09/20  Lynne Logan, RN

## 2020-07-21 NOTE — Chronic Care Management (AMB) (Signed)
Chronic Care Management   CCM RN Visit Note  07/17/2020 Name: Mary Neal MRN: [id number] DOB: [birth year]  Subjective: Mary Neal is a 55 y.o. year old female who is a primary care patient of Glendale Chard, MD. The care management team was consulted for assistance with disease management and care coordination needs.    Engaged with patient by telephone for follow up visit in response to provider referral for case management and/or care coordination services.   Consent to Services:  The patient was given information about Chronic Care Management services, agreed to services, and gave verbal consent prior to initiation of services.  Please see initial visit note for detailed documentation.   Patient agreed to services and verbal consent obtained.   Assessment/Interventions: Review of patient past medical history, allergies, medications, health status, including review of consultants reports, laboratory and other test data, was performed as part of comprehensive evaluation and provision of chronic care management services.   SDOH (Social Determinants of Health) assessments and interventions performed:  Yes, no acute challenges identified  CCM Care Plan  Allergies  Allergen Reactions  . Clindamycin/Lincomycin Palpitations  . Latex Other (See Comments)  . Penicillins Other (See Comments)    Outpatient Encounter Medications as of 07/17/2020  Medication Sig  . acetaminophen (TYLENOL) 325 MG tablet Take 325 mg by mouth every 6 (six) hours as needed for headache.  . Cholecalciferol 1000 UNITS capsule Take 500 Units by mouth daily.  . clindamycin (CLEOCIN) 300 MG capsule TAKE 2 CAPSULES 1 HOUR BEFORE DENTAL APPOINTMENT  . [employer] MAGNESIUM OXIDE 250 MG TABS Take 1 tablet by mouth daily.  . famotidine (PEPCID) 20 MG tablet TAKE 1 TABLET BY MOUTH EVERY DAY  . Magnesium 250 MG TABS Take 1 tablet (250 mg total) by mouth every evening. With evening meal  . Multiple Vitamin  (MULTI-VITAMINS) TABS Take by mouth.  Mary Neal omeprazole (PRILOSEC) 40 MG capsule TAKE 1 CAPSULE BY MOUTH EVERY DAY  . oxybutynin (DITROPAN XL) 15 MG 24 hr tablet Take 15 mg by mouth 2 (two) times daily.   . polyethylene glycol (MIRALAX) packet Take 17 g by mouth daily as needed for moderate constipation or severe constipation.  . senna (SENOKOT) 8.6 MG TABS tablet Take 1 tablet by mouth as needed. Takes 1 tab every other day  . valsartan (DIOVAN) 160 MG tablet TAKE 1 TABLET BY MOUTH EVERY DAY  . vitamin C (ASCORBIC ACID) 500 MG tablet Take by mouth.   No facility-administered encounter medications on file as of 07/17/2020.    Patient Active Problem List   Diagnosis Date Noted  . Nonrheumatic mitral valve regurgitation 05/08/2019  . Atypical chest pain 04/17/2019  . Chronic pain of right knee 06/06/2018  . Congenital dysplasia of right hip 06/06/2018  . Osteoarthritis of left knee 06/17/2013  . Spina bifida without hydrocephalus ([place]) 03/25/2013  . Injury of ligament of left knee 03/25/2013  . Paraparesis ([place]) 03/25/2013  . Tear of medial collateral ligament of knee (left) 03/25/2013  . Fibroids 02/14/2012    Conditions to be addressed/monitored:HTN and Spina bifida without hydrocephalus, unspecified, GERD, Osteoarthritis of left knee, Congenital dysplasia or right hip . Goals Addressed      Patient Stated   .  COMPLETED: "to have her hand and arm pain evaluated" (pt-stated)        CARE PLAN ENTRY (see longitudinal plan of care for additional care plan information)  Current Barriers:  Mary Neal Knowledge Deficits related to evaluation and treatment of bilateral  hand and arm pain . Chronic Disease Management support and education needs related to Spina bifida without hydrocephalus, unspecified, GERD, Osteoarthritis of left knee, Congenital dysplasia or right hip, HTN  Nurse Case Manager Clinical Goal(s):  Mary Neal Over the next 30 days, patient will work with PCP to address needs related to  evaluation and treatment of bilateral hand and arm pain   CCM RN CM Interventions:  07/17/20 call completed with [name] . Inter-disciplinary care team collaboration (see longitudinal plan of care) . Evaluation of current treatment plan related to acute bilateral hand/arm pain and patient's adherence to plan as established by provider . Determined patient is no longer experiencing bilateral hand and arm pain and no further interventions are needed . Advised mother to notify Dr. Baird Cancer if symptoms reoccure . Discussed plans with patient for ongoing care management follow up and provided patient with direct contact information for care management team  Patient Self Care Activities:  . Self administers medications as prescribed . Attend all scheduled provider appointments . Call pharmacy for medication refills . Call provider office for new concerns or questions  Please see past updates related to this goal by clicking on the "Past Updates" button in the selected goal        Other   .  COMPLETED: "I am concerned about her headaches"        CARE PLAN ENTRY (see longitudinal plan of care for additional care plan information)  Current Barriers:  Mary Neal Knowledge Deficits related to evaluation and treatment for persistent headaches  . Chronic Disease Management support and education needs related to Spina bifida without hydrocephalus, unspecified, GERD, Osteoarthritis of left knee, Congenital dysplasia or right hip, HTN  Nurse Case Manager Clinical Goal(s):  Mary Neal Over the next 45 days, patient will work with the PCP to address needs related to evaluation and treatment for persistent headaches  CCM RN CM Interventions:  07/17/20 call completed with patient's [name] . Inter-disciplinary care team collaboration (see longitudinal plan of care) . Evaluation of current treatment plan related to persistent headaches and patient's adherence to plan as established by provider . Determined  patient's headaches have resolved and no further interventions are needed at this time . Discussed plans with patient for ongoing care management follow up and provided patient with direct contact information for care management team  Patient Self Care Activities:  . Self administer medications as prescribed . Attends all scheduled provider appointments . Call pharmacy for medication refills . Call provider office for new concerns or questions  Please see past updates related to this goal by clicking on the "Past Updates" button in the selected goal      .  COMPLETED: "I need help contacting the home health agency"        Mother stated  [place] (see longitudinal plan of care for additional care plan information)  Current Barriers:  Mary Neal Knowledge Deficits related to referred agency to provide in home PT services  . Chronic Disease Management support and education needs related to Spina bifida without hydrocephalus, unspecified, GERD, Osteoarthritis of left knee, Congenital dysplasia or right hip, HTN  Nurse Case Manager Clinical Goal(s):  Mary Neal Over the next 30 days, patient will work with [place] provider  to address needs related to PT needs for strengthening and endurance  . Over the next 90 days, patient will work with the CCM team and PCP for disease education and support to help improve/maintain physical mobility   CCM RN CM  Interventions:  07/17/20 call completed with [name] . Inter-disciplinary care team collaboration (see longitudinal plan of care) . Evaluation of current treatment plan related to Impaired Physical Mobility and patient's adherence to plan as established by provider . Determined patient completed [facility] services with [facility] in late September  . Determined patient currently has no further [place] needs and continues to have a CNA assist with personal care needs  . Discussed plans with patient for ongoing care management follow up and provided patient with  direct contact information for care management team  Patient Self Care Activities:  . Self administer medications as prescribed . Attend all scheduled provider appointments . Call pharmacy for medication refills . Call provider office for new concerns or questions  Please see past updates related to this goal by clicking on the "Past Updates" button in the selected goal        Care Plan : Hypertension (Adult)  Updates made by Lynne Logan, RN since 07/21/2020 12:00 AM    Problem: Disease Progression (Hypertension)   Priority: High    Long-Range Goal: Disease Progression Prevented or Minimized   Start Date: 07/17/2020  Expected End Date: 10/15/2020  This Visit's Progress: On track  Priority: High  Note:   Objective:  . Last practice recorded BP readings:  BP Readings from Last 3 Encounters:  07/01/20 132/76  07/01/20 132/76  04/08/20 110/72 .   Mary Neal Most recent eGFR/CrCl: No results found for: EGFR  No components found for: CRCL Current Barriers:  Mary Neal Knowledge Deficits related to basic understanding of hypertension pathophysiology and self care management . Knowledge Deficits related to understanding of medications prescribed for management of hypertension . Lacks social connections . Unable to perform ADLs independently . Unable to perform IADLs independently Case Manager Clinical Goal(s):  Mary Neal Over the next 90 days, patient will verbalize basic understanding of hypertension disease process and self health management plan as evidenced by patient will maintain blood pressure at target range less than 130/80 and will not experience complications secondary to uncontrolled hypertension  Interventions:  . Evaluation of current treatment plan related to hypertension self management and patient's adherence to plan as established by provider . Assess for and promote awareness of worsening disease or development of comorbidity.  . Reviewed medications with patient and discussed importance  of compliance . Advised patient, providing education and rationale, to monitor blood pressure daily and record, calling PCP for findings outside established parameters.  . Discussed plans with patient for ongoing care management follow up and provided patient with direct contact information for care management team Patient Goals/Self-Care Activities . Over the next 90 days, patient will:  - Self administers medications as prescribed Attends all scheduled provider appointments Calls provider office for new concerns, questions, or BP outside discussed parameters Checks BP and records as discussed Follows a low sodium diet/DASH diet check blood pressure 3 times per week write blood pressure results in a log or diary   Follow Up Plan: Telephone follow up appointment with care management team member scheduled for: 10/09/20     Plan:Telephone follow up appointment with care management team member scheduled for:  10/09/20   Barb Merino, RN, BSN, CCM Care Management Coordinator [facility] [facility]  Direct Phone: [phone]

## 2020-08-10 ENCOUNTER — Other Ambulatory Visit: Payer: Self-pay

## 2020-08-10 ENCOUNTER — Encounter: Payer: Self-pay | Admitting: Obstetrics and Gynecology

## 2020-08-10 ENCOUNTER — Ambulatory Visit (INDEPENDENT_AMBULATORY_CARE_PROVIDER_SITE_OTHER): Payer: Medicare Other | Admitting: Obstetrics and Gynecology

## 2020-08-10 ENCOUNTER — Other Ambulatory Visit (HOSPITAL_COMMUNITY)
Admission: RE | Admit: 2020-08-10 | Discharge: 2020-08-10 | Disposition: A | Discharge status: Home / Self Care | Payer: Medicare Other | Source: Ambulatory Visit | Attending: Obstetrics and Gynecology | Admitting: Obstetrics and Gynecology

## 2020-08-10 VITALS — BP 112/50 | HR 62 | Ht <= 58 in | Wt 115.1 lb

## 2020-08-10 DIAGNOSIS — Z01419 Encounter for gynecological examination (general) (routine) without abnormal findings: Secondary | ICD-10-CM | POA: Insufficient documentation

## 2020-08-10 DIAGNOSIS — N8189 Other female genital prolapse: Secondary | ICD-10-CM | POA: Diagnosis not present

## 2020-08-10 NOTE — Progress Notes (Signed)
Mary Neal is a 55 y.o. G0P0000 female here for a routine annual gynecologic exam.  Current complaints: Occ constipation and urination incont. She does self cath d/t her H/O spina bifidia.   She is not sexual active. Last pap unknown, no STD.  She is postmenopausal. She denies any menopausal Sx.   Gynecologic History No LMP recorded. (Menstrual status: Perimenopausal). Contraception: post menopausal status Last Pap: uncertain.  Last mammogram: 12/21. Results were: normal  Obstetric History OB History  Gravida Para Term Preterm AB Living  0 0 0 0 0    SAB IAB Ectopic Multiple Live Births  0 0 0        Past Medical History:  Diagnosis Date  . Abnormal Pap smear 2013  . Dislocation of right hip ([place]) 06/09/2018   Per MRI  . History of knee replacement 2005  . Spina bifida ([place])   . Uterine prolapse   . Wears glasses     Past Surgical History:  Procedure Laterality Date  . TOTAL HIP ARTHROPLASTY Right 12/2018    Current Outpatient Medications on File Prior to Visit  Medication Sig Dispense Refill  . acetaminophen (TYLENOL) 325 MG tablet Take 325 mg by mouth every 6 (six) hours as needed for headache.    . Cholecalciferol 1000 UNITS capsule Take 500 Units by mouth daily.    . [employer] MAGNESIUM OXIDE 250 MG TABS Take 1 tablet by mouth daily.    . famotidine (PEPCID) 20 MG tablet TAKE 1 TABLET BY MOUTH EVERY DAY 90 tablet 1  . Magnesium 250 MG TABS Take 1 tablet (250 mg total) by mouth every evening. With evening meal 90 tablet 1  . Multiple Vitamin (MULTI-VITAMINS) TABS Take by mouth.    Marland Kitchen omeprazole (PRILOSEC) 40 MG capsule TAKE 1 CAPSULE BY MOUTH EVERY DAY 90 capsule 1  . oxybutynin (DITROPAN XL) 15 MG 24 hr tablet Take 15 mg by mouth 2 (two) times daily.     . polyethylene glycol (MIRALAX) packet Take 17 g by mouth daily as needed for moderate constipation or severe constipation. 14 each 0  . senna (SENOKOT) 8.6 MG TABS tablet Take 1 tablet by mouth as needed. Takes 1 tab  every other day    . valsartan (DIOVAN) 160 MG tablet TAKE 1 TABLET BY MOUTH EVERY DAY 90 tablet 1  . vitamin C (ASCORBIC ACID) 500 MG tablet Take by mouth.    . clindamycin (CLEOCIN) 300 MG capsule TAKE 2 CAPSULES 1 HOUR BEFORE DENTAL APPOINTMENT (Patient not taking: Reported on 08/10/2020) 2 capsule 1   No current facility-administered medications on file prior to visit.    Allergies  Allergen Reactions  . Clindamycin/Lincomycin Palpitations  . Latex Other (See Comments)  . Penicillins Other (See Comments)    Social History   Socioeconomic History  . Marital status: Single    Spouse name: Not on file  . Number of children: 0  . Years of education: Not on file  . Highest education level: Not on file  Occupational History  . Occupation: unemployed  Tobacco Use  . Smoking status: Never Smoker  . Smokeless tobacco: Never Used  Vaping Use  . Vaping Use: Never used  Substance and Sexual Activity  . Alcohol use: No    Alcohol/week: 0.0 standard drinks  . Drug use: No  . Sexual activity: Never    Birth control/protection: None  Other Topics Concern  . Not on file  Social History Narrative   ** Merged History Encounter **  Social Determinants of Health   Financial Resource Strain: Low Risk   . Difficulty of Paying Living Expenses: Not hard at all  Food Insecurity: No Food Insecurity  . Worried About Charity fundraiser in the Last Year: Never true  . Ran Out of Food in the Last Year: Never true  Transportation Needs: No Transportation Needs  . Lack of Transportation (Medical): No  . Lack of Transportation (Non-Medical): No  Physical Activity: Inactive  . Days of Exercise per Week: 0 days  . Minutes of Exercise per Session: 0 min  Stress: No Stress Concern Present  . Feeling of Stress : Not at all  Social Connections: Moderately Isolated  . Frequency of Communication with Friends and Family: More than three times a week  . Frequency of Social Gatherings with  Friends and Family: Never  . Attends Religious Services: 1 to 4 times per year  . Active Member of Clubs or Organizations: No  . Attends Archivist Meetings: Never  . Marital Status: Never married  Intimate Partner Violence: Not on file    Family History  Problem Relation Age of Onset  . Hypertension Mother   . Hyperlipidemia Mother   . Heart attack Brother     The following portions of the patient's history were reviewed and updated as appropriate: allergies, current medications, past family history, past medical history, past social history, past surgical history and problem list.  Review of Systems Pertinent items noted in HPI and remainder of comprehensive ROS otherwise negative.   Objective:  BP (!) 112/50   Pulse 62   Ht 4\' 9"  (1.448 m)   Wt 115 lb 1.6 oz (52.2 kg)   BMI 24.91 kg/m  CONSTITUTIONAL: Well-developed, well-nourished female in no acute distress.  HENT:  Normocephalic, atraumatic, External right and left ear normal. Oropharynx is clear and moist EYES: Conjunctivae and EOM are normal. Pupils are equal, round, and reactive to light. No scleral icterus.  NECK: Normal range of motion, supple, no masses.  Normal thyroid.  SKIN: Skin is warm and dry. No rash noted. Not diaphoretic. No erythema. No pallor. [place]: Alert and oriented to person, place, and time. Normal reflexes, muscle tone coordination. No cranial nerve deficit noted. PSYCHIATRIC: Normal mood and affect. Normal behavior. Normal judgment and thought content. CARDIOVASCULAR: Normal heart rate noted, regular rhythm RESPIRATORY: Clear to auscultation bilaterally. Effort and breath sounds normal, no problems with respiration noted. BREASTS: Deferred. ABDOMEN: Soft, normal bowel sounds, no distention noted.  No tenderness, rebound or guarding.  PELVIC: Normal appearing external genitalia; atrophic vaginal mucosa, uterine prolapse passed hymenal ring with valsalva, uterus small, mobile non tender,  no masses MUSCULOSKELETAL: Bilateral leg braces noted  Assessment:  Annual gynecologic examination with pap smear Pelvic relaxation Plan:  Will follow up results of pap smear and manage accordingly. Discussed pelvic relaxation and Tx options. Pessary or referral to Uro/Gyn. Pt declined Routine preventative health maintenance measures emphasized. Please refer to After Visit Summary for other counseling recommendations.    Chancy Milroy, MD, [facility] Attending [facility] for [facility], [place]

## 2020-08-10 NOTE — Patient Instructions (Signed)
Health Maintenance, Female Adopting a healthy lifestyle and getting preventive care are important in promoting health and wellness. Ask your health care provider about:  The right schedule for you to have regular tests and exams.  Things you can do on your own to prevent diseases and keep yourself healthy. What should I know about diet, weight, and exercise? Eat a healthy diet  Eat a diet that includes plenty of vegetables, fruits, low-fat dairy products, and lean protein.  Do not eat a lot of foods that are high in solid fats, added sugars, or sodium.   Maintain a healthy weight Body mass index (BMI) is used to identify weight problems. It estimates body fat based on height and weight. Your health care provider can help determine your BMI and help you achieve or maintain a healthy weight. Get regular exercise Get regular exercise. This is one of the most important things you can do for your health. Most adults should:  Exercise for at least 150 minutes each week. The exercise should increase your heart rate and make you sweat (moderate-intensity exercise).  Do strengthening exercises at least twice a week. This is in addition to the moderate-intensity exercise.  Spend less time sitting. Even light physical activity can be beneficial. Watch cholesterol and blood lipids Have your blood tested for lipids and cholesterol at 56 years of age, then have this test every 5 years. Have your cholesterol levels checked more often if:  Your lipid or cholesterol levels are high.  You are older than 56 years of age.  You are at high risk for heart disease. What should I know about cancer screening? Depending on your health history and family history, you may need to have cancer screening at various ages. This may include screening for:  Breast cancer.  Cervical cancer.  Colorectal cancer.  Skin cancer.  Lung cancer. What should I know about heart disease, diabetes, and high blood  pressure? Blood pressure and heart disease  High blood pressure causes heart disease and increases the risk of stroke. This is more likely to develop in people who have high blood pressure readings, are of African descent, or are overweight.  Have your blood pressure checked: ? Every 3-5 years if you are 18-39 years of age. ? Every year if you are 40 years old or older. Diabetes Have regular diabetes screenings. This checks your fasting blood sugar level. Have the screening done:  Once every three years after age 40 if you are at a normal weight and have a low risk for diabetes.  More often and at a younger age if you are overweight or have a high risk for diabetes. What should I know about preventing infection? Hepatitis B If you have a higher risk for hepatitis B, you should be screened for this virus. Talk with your health care provider to find out if you are at risk for hepatitis B infection. Hepatitis C Testing is recommended for:  Everyone born from 1945 through 1965.  Anyone with known risk factors for hepatitis C. Sexually transmitted infections (STIs)  Get screened for STIs, including gonorrhea and chlamydia, if: ? You are sexually active and are younger than 56 years of age. ? You are older than 56 years of age and your health care provider tells you that you are at risk for this type of infection. ? Your sexual activity has changed since you were last screened, and you are at increased risk for chlamydia or gonorrhea. Ask your health care provider   if you are at risk.  Ask your health care provider about whether you are at high risk for HIV. Your health care provider may recommend a prescription medicine to help prevent HIV infection. If you choose to take medicine to prevent HIV, you should first get tested for HIV. You should then be tested every 3 months for as long as you are taking the medicine. Pregnancy  If you are about to stop having your period (premenopausal) and  you may become pregnant, seek counseling before you get pregnant.  Take 400 to 800 micrograms (mcg) of folic acid every day if you become pregnant.  Ask for birth control (contraception) if you want to prevent pregnancy. Osteoporosis and menopause Osteoporosis is a disease in which the bones lose minerals and strength with aging. This can result in bone fractures. If you are 65 years old or older, or if you are at risk for osteoporosis and fractures, ask your health care provider if you should:  Be screened for bone loss.  Take a calcium or vitamin D supplement to lower your risk of fractures.  Be given hormone replacement therapy (HRT) to treat symptoms of menopause. Follow these instructions at home: Lifestyle  Do not use any products that contain nicotine or tobacco, such as cigarettes, e-cigarettes, and chewing tobacco. If you need help quitting, ask your health care provider.  Do not use street drugs.  Do not share needles.  Ask your health care provider for help if you need support or information about quitting drugs. Alcohol use  Do not drink alcohol if: ? Your health care provider tells you not to drink. ? You are pregnant, may be pregnant, or are planning to become pregnant.  If you drink alcohol: ? Limit how much you use to 0-1 drink a day. ? Limit intake if you are breastfeeding.  Be aware of how much alcohol is in your drink. In the [place] one drink equals one 12 oz bottle of beer (355 mL), one 5 oz glass of wine (148 mL), or one 1 oz glass of hard liquor (44 mL). General instructions  Schedule regular health, dental, and eye exams.  Stay current with your vaccines.  Tell your health care provider if: ? You often feel depressed. ? You have ever been abused or do not feel safe at home. Summary  Adopting a healthy lifestyle and getting preventive care are important in promoting health and wellness.  Follow your health care provider's instructions about healthy  diet, exercising, and getting tested or screened for diseases.  Follow your health care provider's instructions on monitoring your cholesterol and blood pressure. This information is not intended to replace advice given to you by your health care provider. Make sure you discuss any questions you have with your health care provider. Document Revised: 06/20/2018 Document Reviewed: 06/20/2018 Elsevier Patient Education  2021 Elsevier Inc.  

## 2020-08-12 LAB — CYTOLOGY - PAP
Comment: NEGATIVE
Diagnosis: NEGATIVE
High risk HPV: NEGATIVE

## 2020-08-13 ENCOUNTER — Encounter: Payer: Self-pay | Admitting: Internal Medicine

## 2020-09-21 ENCOUNTER — Telehealth: Payer: Self-pay | Admitting: Orthopaedic Surgery

## 2020-09-21 NOTE — Telephone Encounter (Signed)
07/14/20 ov note faxed to [facility]

## 2020-09-22 ENCOUNTER — Other Ambulatory Visit: Payer: Self-pay | Admitting: Internal Medicine

## 2020-09-29 ENCOUNTER — Other Ambulatory Visit: Payer: Self-pay | Admitting: Nurse Practitioner

## 2020-09-29 DIAGNOSIS — I10 Essential (primary) hypertension: Secondary | ICD-10-CM

## 2020-09-29 DIAGNOSIS — R252 Cramp and spasm: Secondary | ICD-10-CM

## 2020-10-01 ENCOUNTER — Other Ambulatory Visit: Payer: Self-pay

## 2020-10-01 MED ORDER — CLINDAMYCIN HCL 300 MG PO CAPS: ORAL_CAPSULE | ORAL | 1 refills | Status: DC

## 2020-10-06 ENCOUNTER — Encounter: Payer: Self-pay | Admitting: Internal Medicine

## 2020-10-09 ENCOUNTER — Telehealth: Payer: Medicare Other

## 2020-10-09 ENCOUNTER — Other Ambulatory Visit: Payer: Self-pay

## 2020-10-09 DIAGNOSIS — I10 Essential (primary) hypertension: Secondary | ICD-10-CM

## 2020-10-09 DIAGNOSIS — R252 Cramp and spasm: Secondary | ICD-10-CM

## 2020-10-09 MED ORDER — OXYBUTYNIN CHLORIDE ER 15 MG PO TB24: 15.0000 mg | ORAL_TABLET | Freq: Two times a day (BID) | ORAL | 2 refills | Status: DC

## 2020-10-09 MED ORDER — VALSARTAN 160 MG PO TABS: 160.0000 mg | ORAL_TABLET | Freq: Every day | ORAL | 2 refills | Status: DC

## 2020-10-09 MED ORDER — MAGNESIUM OXIDE -MG SUPPLEMENT 250 MG PO TABS: ORAL_TABLET | ORAL | 2 refills | Status: DC

## 2020-10-09 MED ORDER — OMEPRAZOLE 40 MG PO CPDR: DELAYED_RELEASE_CAPSULE | ORAL | 2 refills | Status: DC

## 2020-10-13 ENCOUNTER — Other Ambulatory Visit: Payer: Self-pay

## 2020-10-13 MED ORDER — CLINDAMYCIN HCL 300 MG PO CAPS: ORAL_CAPSULE | ORAL | 1 refills | Status: DC

## 2020-10-13 MED ORDER — OXYBUTYNIN CHLORIDE ER 15 MG PO TB24: 15.0000 mg | ORAL_TABLET | Freq: Two times a day (BID) | ORAL | 1 refills | Status: DC

## 2020-10-13 MED ORDER — OMEPRAZOLE 40 MG PO CPDR: DELAYED_RELEASE_CAPSULE | ORAL | 2 refills | Status: DC

## 2020-10-13 MED ORDER — FAMOTIDINE 20 MG PO TABS: 20.0000 mg | ORAL_TABLET | Freq: Every day | ORAL | 2 refills | Status: DC

## 2020-10-13 MED ORDER — VALSARTAN 160 MG PO TABS: 160.0000 mg | ORAL_TABLET | Freq: Every day | ORAL | 2 refills | Status: DC

## 2020-10-13 NOTE — Telephone Encounter (Signed)
The pt's mother Mrs Markwell confirmed the pt takes oxybutynin 1 tab 2 times per.

## 2020-11-03 ENCOUNTER — Telehealth: Payer: Self-pay

## 2020-11-03 ENCOUNTER — Other Ambulatory Visit: Payer: Self-pay

## 2020-11-03 MED ORDER — CLINDAMYCIN HCL 300 MG PO CAPS: ORAL_CAPSULE | ORAL | 1 refills | Status: DC

## 2020-11-03 NOTE — Telephone Encounter (Signed)
Called to let the pt's mother know that her antibiotic has been sent in the pt's pharmacy

## 2020-11-10 ENCOUNTER — Other Ambulatory Visit: Payer: Medicare Other

## 2020-11-10 ENCOUNTER — Other Ambulatory Visit: Payer: Self-pay

## 2020-11-10 DIAGNOSIS — Z20822 Contact with and (suspected) exposure to covid-19: Secondary | ICD-10-CM

## 2020-11-10 NOTE — Addendum Note (Signed)
Addended by: Azalee Course T on: 11/10/2020 09:19 AM   Modules accepted: Orders

## 2020-11-10 NOTE — Progress Notes (Signed)
Pt is here for a covid test.

## 2020-11-11 LAB — NOVEL CORONAVIRUS, NAA: SARS-CoV-2, NAA: NOT DETECTED

## 2020-11-12 ENCOUNTER — Telehealth: Payer: Self-pay

## 2020-11-12 ENCOUNTER — Telehealth: Payer: Medicare Other

## 2020-11-12 NOTE — Telephone Encounter (Signed)
Patient's mother notified of her result she stated patient is feeling good. YL,RMA

## 2020-11-17 DIAGNOSIS — Q059 Spina bifida, unspecified: Secondary | ICD-10-CM | POA: Diagnosis not present

## 2020-11-24 DIAGNOSIS — R339 Retention of urine, unspecified: Secondary | ICD-10-CM | POA: Diagnosis not present

## 2020-12-16 DIAGNOSIS — R339 Retention of urine, unspecified: Secondary | ICD-10-CM | POA: Diagnosis not present

## 2020-12-23 ENCOUNTER — Encounter: Payer: Self-pay | Admitting: Nurse Practitioner

## 2020-12-23 ENCOUNTER — Other Ambulatory Visit: Payer: Self-pay

## 2020-12-23 ENCOUNTER — Ambulatory Visit (INDEPENDENT_AMBULATORY_CARE_PROVIDER_SITE_OTHER): Payer: Medicare Other | Admitting: Nurse Practitioner

## 2020-12-23 VITALS — BP 118/62 | HR 76 | Temp 98.2°F | Ht <= 58 in | Wt 105.0 lb

## 2020-12-23 DIAGNOSIS — R252 Cramp and spasm: Secondary | ICD-10-CM

## 2020-12-23 DIAGNOSIS — I10 Essential (primary) hypertension: Secondary | ICD-10-CM

## 2020-12-23 DIAGNOSIS — Z117 Encounter for testing for latent tuberculosis infection: Secondary | ICD-10-CM

## 2020-12-23 DIAGNOSIS — Q059 Spina bifida, unspecified: Secondary | ICD-10-CM

## 2020-12-23 DIAGNOSIS — E7800 Pure hypercholesterolemia, unspecified: Secondary | ICD-10-CM

## 2020-12-23 MED ORDER — MAGNESIUM OXIDE -MG SUPPLEMENT 250 MG PO TABS
ORAL_TABLET | ORAL | 2 refills | Status: DC
Start: 2020-12-23 — End: 2020-12-23

## 2020-12-23 MED ORDER — MAGNESIUM OXIDE -MG SUPPLEMENT 250 MG PO TABS: ORAL_TABLET | ORAL | 2 refills | Status: DC

## 2020-12-23 MED ORDER — CVS MAGNESIUM OXIDE 250 MG PO TABS: 1.0000 | ORAL_TABLET | Freq: Every day | ORAL | 2 refills | Status: DC

## 2020-12-23 NOTE — Patient Instructions (Signed)

## 2020-12-23 NOTE — Progress Notes (Signed)
I,Yamilka Roman [employer] as a Education administrator for [employer], NP.,have documented all relevant documentation on the behalf of [employer], NP,as directed by  Bary Castilla, NP while in the presence of Bary Castilla, NP.  This visit occurred during the SARS-CoV-2 public health emergency.  Safety protocols were in place, including screening questions prior to the visit, additional usage of staff PPE, and extensive cleaning of exam room while observing appropriate contact time as indicated for disinfecting solutions.  Subjective:     Patient ID: Mary Neal , female    DOB: [birth year] , 55 y.o.   MRN: [id number]   Chief Complaint  Patient presents with   Hypertension    HPI  She presents today for BP check. She reports compliance with meds. She is accompanied by her Mother today. She is here to get a form for the enrichment form to go to a adult daycare in [facility]. No other concern expressed today. BP today was 118/62. She is careful at what she is eating. She continues to take stool softener as needed due to her neurogenic bladder issues. She does use a cane due to her having the paralysis in both of her feet.   Hypertension This is a chronic problem. The problem has been gradually improving since onset. The problem is controlled. Pertinent negatives include no blurred vision, chest pain, headaches, palpitations or shortness of breath. Past treatments include angiotensin blockers. The current treatment provides moderate improvement.    Past Medical History:  Diagnosis Date   Abnormal Pap smear 2013   Dislocation of right hip ([place]) 06/09/2018   Per MRI   History of knee replacement 2005   Spina bifida [facility])    Uterine prolapse    Wears glasses      Family History  Problem Relation Age of Onset   Hypertension Mother    Hyperlipidemia Mother    Heart attack Brother      Current Outpatient Medications:    Cholecalciferol 1000 UNITS capsule, Take 500 Units  by mouth daily., Disp: , Rfl:    famotidine (PEPCID) 20 MG tablet, Take 1 tablet (20 mg total) by mouth daily., Disp: 90 tablet, Rfl: 2   Multiple Vitamin (MULTI-VITAMINS) TABS, Take by mouth., Disp: , Rfl:    omeprazole (PRILOSEC) 40 MG capsule, TAKE 1 CAPSULE BY MOUTH EVERY DAY, Disp: 90 capsule, Rfl: 2   oxybutynin (DITROPAN XL) 15 MG 24 hr tablet, Take 1 tablet (15 mg total) by mouth 2 (two) times daily. Patient takes 1 tab 2 times per day, Disp: 180 tablet, Rfl: 1   senna (SENOKOT) 8.6 MG TABS tablet, Take 1 tablet by mouth as needed. Takes 1 tab every other day, Disp: , Rfl:    valsartan (DIOVAN) 160 MG tablet, Take 1 tablet (160 mg total) by mouth daily., Disp: 90 tablet, Rfl: 2   vitamin C (ASCORBIC ACID) 500 MG tablet, Take by mouth., Disp: , Rfl:    acetaminophen (TYLENOL) 325 MG tablet, Take 325 mg by mouth every 6 (six) hours as needed for headache. (Patient not taking: Reported on 12/23/2020), Disp: , Rfl:    clindamycin (CLEOCIN) 300 MG capsule, TAKE 2 CAPSULES 1 HOUR BEFORE DENTAL APPOINTMENT (Patient not taking: Reported on 12/23/2020), Disp: 2 capsule, Rfl: 1   Magnesium Oxide -Mg Supplement ([employer]S MAGNESIUM OXIDE) 250 MG TABS, TAKE 1 TABLET (250 MG TOTAL) BY MOUTH EVERY EVENING. WITH EVENING MEAL, Disp: 90 tablet, Rfl: 2   polyethylene glycol (MIRALAX) packet, Take 17 g by mouth  daily as needed for moderate constipation or severe constipation. (Patient not taking: Reported on 12/23/2020), Disp: 14 each, Rfl: 0   Allergies  Allergen Reactions   Clindamycin/Lincomycin Palpitations   Latex Other (See Comments)   Penicillins Other (See Comments)     Review of Systems  Constitutional:  Negative for chills, fatigue and fever.  HENT:  Negative for congestion, sinus pressure and sinus pain.   Eyes:  Negative for blurred vision.  Respiratory:  Negative for cough, chest tightness and shortness of breath.   Cardiovascular:  Negative for chest pain and palpitations.  Gastrointestinal:   Negative for diarrhea and nausea.  Genitourinary:  Positive for difficulty urinating.       Hx of neurogenic bladder.   Neurological:  Negative for headaches.    Today's Vitals   12/23/20 0905  BP: 118/62  Pulse: 76  Temp: 98.2 F (36.8 C)  Weight: 105 lb (47.6 kg)  Height: _0  (1.448 m)  PainSc: 0-No pain   Body mass index is 22.72 kg/m.   Objective:  Physical Exam Constitutional:      Appearance: Normal appearance.  HENT:     Head: Normocephalic and atraumatic.  Cardiovascular:     Rate and Rhythm: Normal rate and regular rhythm.     Pulses: Normal pulses.     Heart sounds: Normal heart sounds. No murmur heard. Pulmonary:     Effort: Pulmonary effort is normal. No respiratory distress.     Breath sounds: Normal breath sounds. No wheezing.  Musculoskeletal:        General: No swelling.     Comments: Paralysis of both feet  Skin:    General: Skin is warm and dry.     Capillary Refill: Capillary refill takes less than 2 seconds.  Neurological:     Mental Status: She is alert and oriented to person, place, and time.  Psychiatric:        Mood and Affect: Mood normal.        Behavior: Behavior normal.        Thought Content: Thought content normal.        Assessment And Plan:     1. Spina bifida without hydrocephalus, unspecified spinal region [facility]) -Chronic -Patient is stable -Patient is here for a form that has to be filled out for her to attend adult daycare.   2. Essential hypertension, benign -Chronic, stable  -Continue meds  -Limit the intake of processed foods and salt intake. You should increase your intake of green vegetables and fruits. Limit the use of alcohol. Limit fast foods and fried foods. Avoid high fatty saturated and trans fat foods. Keep yourself hydrated with drinking water. Avoid red meats. Eat lean meats instead. Exercise for atleast 30-45 min for atleast 4-5 times a week.  - CMP14+EGFR - CBC no Diff  3. Elevated cholesterol -Continue  current meds, tolerating well.  -Encouraged to continue with a low fat diet avoiding fast food and fried food.  - Lipid panel  4. Muscle cramps - Magnesium Oxide -Mg Supplement (CVS MAGNESIUM OXIDE) 250 MG TABS; TAKE 1 TABLET (250 MG TOTAL) BY MOUTH EVERY EVENING. WITH EVENING MEAL  Dispense: 90 tablet; Refill: 2  5. Encounter for testing for latent tuberculosis infection -Blood test for TB needed for her form to attend the adult daycare.  -Completed today  - QuantiFERON-TB Gold Plus   The patient was encouraged to call or send a message through [place] for any questions or concerns.   Side effects  and appropriate use of all the medication(s) were discussed with the patient today. Patient advised to use the medication(s) as directed by their healthcare provider. The patient was encouraged to read, review, and understand all associated package inserts and contact our office with any questions or concerns. The patient accepts the risks of the treatment plan and had an opportunity to ask questions.   Patient was given opportunity to ask questions. Patient verbalized understanding of the plan and was able to repeat key elements of the plan. All questions were answered to their satisfaction.  Raman Siddalee Vanderheiden, DNP   I, Raman Laurel Smeltz have reviewed all documentation for this visit. The documentation on 12/23/20 for the exam, diagnosis, procedures, and orders are all accurate and complete.    IF YOU HAVE BEEN REFERRED TO A SPECIALIST, IT MAY TAKE 1-2 WEEKS TO SCHEDULE/PROCESS THE REFERRAL. IF YOU HAVE NOT HEARD FROM US/SPECIALIST IN TWO WEEKS, PLEASE GIVE [place] A CALL AT [phone].   THE PATIENT IS ENCOURAGED TO PRACTICE SOCIAL DISTANCING DUE TO THE COVID-19 PANDEMIC.

## 2020-12-28 LAB — CBC
Hematocrit: 36.1 % (ref 34.0–46.6)
Hemoglobin: 12.2 g/dL (ref 11.1–15.9)
MCH: 30.8 pg (ref 26.6–33.0)
MCHC: 33.8 g/dL (ref 31.5–35.7)
MCV: 91 fL (ref 79–97)
Platelets: 160 10*3/uL (ref 150–450)
RBC: 3.96 x10E6/uL (ref 3.77–5.28)
RDW: 11.4 % — ABNORMAL LOW (ref 11.7–15.4)
WBC: 6.5 10*3/uL (ref 3.4–10.8)

## 2020-12-28 LAB — CMP14+EGFR
ALT: 20 IU/L (ref 0–32)
AST: 20 IU/L (ref 0–40)
Albumin/Globulin Ratio: 2 (ref 1.2–2.2)
Albumin: 3.9 g/dL (ref 3.8–4.9)
Alkaline Phosphatase: 77 IU/L (ref 44–121)
BUN/Creatinine Ratio: 24 — ABNORMAL HIGH (ref 9–23)
BUN: 16 mg/dL (ref 6–24)
Bilirubin Total: 0.4 mg/dL (ref 0.0–1.2)
CO2: 21 mmol/L (ref 20–29)
Calcium: 9.6 mg/dL (ref 8.7–10.2)
Chloride: 105 mmol/L (ref 96–106)
Creatinine, Ser: 0.67 mg/dL (ref 0.57–1.00)
Globulin, Total: 2 g/dL (ref 1.5–4.5)
Glucose: 74 mg/dL (ref 65–99)
Potassium: 4.2 mmol/L (ref 3.5–5.2)
Sodium: 141 mmol/L (ref 134–144)
Total Protein: 5.9 g/dL — ABNORMAL LOW (ref 6.0–8.5)
eGFR: 103 mL/min/{1.73_m2} (ref >59)

## 2020-12-28 LAB — QUANTIFERON-TB GOLD PLUS
QuantiFERON Mitogen Value: >10 IU/mL
QuantiFERON Nil Value: 0 IU/mL
QuantiFERON TB1 Ag Value: 0 IU/mL
QuantiFERON TB2 Ag Value: 0 IU/mL
QuantiFERON-TB Gold Plus: NEGATIVE

## 2020-12-28 LAB — LIPID PANEL
Chol/HDL Ratio: 2.2 ratio (ref 0.0–4.4)
Cholesterol, Total: 177 mg/dL (ref 100–199)
HDL: 80 mg/dL (ref >39)
LDL Chol Calc (NIH): 89 mg/dL (ref 0–99)
Triglycerides: 36 mg/dL (ref 0–149)
VLDL Cholesterol Cal: 8 mg/dL (ref 5–40)

## 2020-12-31 ENCOUNTER — Ambulatory Visit: Payer: Medicare Other | Admitting: Internal Medicine

## 2020-12-31 DIAGNOSIS — M2351 Chronic instability of knee, right knee: Secondary | ICD-10-CM | POA: Diagnosis not present

## 2021-01-12 ENCOUNTER — Telehealth: Payer: Medicare Other

## 2021-01-12 ENCOUNTER — Ambulatory Visit (INDEPENDENT_AMBULATORY_CARE_PROVIDER_SITE_OTHER): Payer: Medicare Other

## 2021-01-12 ENCOUNTER — Ambulatory Visit: Payer: Medicare Other

## 2021-01-12 DIAGNOSIS — Q6589 Other specified congenital deformities of hip: Secondary | ICD-10-CM

## 2021-01-12 DIAGNOSIS — I10 Essential (primary) hypertension: Secondary | ICD-10-CM

## 2021-01-12 DIAGNOSIS — Q059 Spina bifida, unspecified: Secondary | ICD-10-CM

## 2021-01-12 DIAGNOSIS — M1712 Unilateral primary osteoarthritis, left knee: Secondary | ICD-10-CM | POA: Diagnosis not present

## 2021-01-12 DIAGNOSIS — K219 Gastro-esophageal reflux disease without esophagitis: Secondary | ICD-10-CM

## 2021-01-12 NOTE — Patient Instructions (Signed)
Social Worker Visit Information  Goals we discussed today:   Goals Addressed             This Visit's Progress    Mobility and Independence Optimized       Timeframe:  Short-Term Goal Priority:  Medium Start Date:  7.5.22                           Expected End Date: 8.4.22                       Patient Goals/Self-Care Activities patient will:   - Work with SW to identify outcome of SCAT application          Follow Up Plan: SW will follow up with patient by phone over the next 21 days   Daneen Schick, BSW, CDP Social Worker, Certified Dementia Practitioner [facility] [phone]

## 2021-01-12 NOTE — Chronic Care Management (AMB) (Signed)
Chronic Care Management    Social Work Note  01/12/2021 Name: Mary Neal MRN: [id number] DOB: Aug 27, 1965  Mary Neal is a 55 y.o. year old female who is a primary care patient of Glendale Chard, MD. The CCM team was consulted to assist the patient with chronic disease management and/or care coordination needs related to: Transportation Needs .   Collaboration with RN Care Manager  for  case discussion  in response to provider referral for social work chronic care management and care coordination services.   Consent to Services:  The patient was given information about Chronic Care Management services, agreed to services, and gave verbal consent prior to initiation of services.  Please see initial visit note for detailed documentation.   Patient agreed to services and consent obtained.   Assessment: Review of patient past medical history, allergies, medications, and health status, including review of relevant consultants reports was performed today as part of a comprehensive evaluation and provision of chronic care management and care coordination services.     SDOH (Social Determinants of Health) assessments and interventions performed:    Advanced Directives Status: Not addressed in this encounter.  CCM Care Plan  Allergies  Allergen Reactions   Clindamycin/Lincomycin Palpitations   Latex Other (See Comments)   Penicillins Other (See Comments)    Outpatient Encounter Medications as of 01/12/2021  Medication Sig   acetaminophen (TYLENOL) 325 MG tablet Take 325 mg by mouth every 6 (six) hours as needed for headache. (Patient not taking: Reported on 12/23/2020)   Cholecalciferol 1000 UNITS capsule Take 500 Units by mouth daily.   clindamycin (CLEOCIN) 300 MG capsule TAKE 2 CAPSULES 1 HOUR BEFORE DENTAL APPOINTMENT (Patient not taking: Reported on 12/23/2020)   famotidine (PEPCID) 20 MG tablet Take 1 tablet (20 mg total) by mouth daily.   Magnesium Oxide -Mg Supplement  ([employer] MAGNESIUM OXIDE) 250 MG TABS TAKE 1 TABLET (250 MG TOTAL) BY MOUTH EVERY EVENING. WITH EVENING MEAL   Multiple Vitamin (MULTI-VITAMINS) TABS Take by mouth.   omeprazole (PRILOSEC) 40 MG capsule TAKE 1 CAPSULE BY MOUTH EVERY DAY   oxybutynin (DITROPAN XL) 15 MG 24 hr tablet Take 1 tablet (15 mg total) by mouth 2 (two) times daily. Patient takes 1 tab 2 times per day   polyethylene glycol (MIRALAX) packet Take 17 g by mouth daily as needed for moderate constipation or severe constipation. (Patient not taking: Reported on 12/23/2020)   senna (SENOKOT) 8.6 MG TABS tablet Take 1 tablet by mouth as needed. Takes 1 tab every other day   valsartan (DIOVAN) 160 MG tablet Take 1 tablet (160 mg total) by mouth daily.   vitamin C (ASCORBIC ACID) 500 MG tablet Take by mouth.   No facility-administered encounter medications on file as of 01/12/2021.    Patient Active Problem List   Diagnosis Date Noted   Visit for routine gyn exam 08/10/2020   Pelvic relaxation 08/10/2020   Essential hypertension 01/28/2020   Gastroesophageal reflux disease without esophagitis 07/12/2019   Nonrheumatic mitral valve regurgitation 05/08/2019   Atypical chest pain 04/17/2019   Neurogenic bowel 12/13/2018   Neurogenic bladder 12/07/2018   Peripheral motor neuropathy 12/07/2018   Osteoarthritis of right hip joint due to dysplasia 11/13/2018   Chronic pain of right knee 06/06/2018   Congenital dysplasia of right hip 06/06/2018   Osteoarthritis of left knee 06/17/2013   Spina bifida without hydrocephalus ([place]) 03/25/2013   Injury of ligament of left knee 03/25/2013   Paraparesis ([place]) 03/25/2013  Tear of medial collateral ligament of knee (left) 03/25/2013   Fibroids 02/14/2012    Conditions to be addressed/monitored:  HTN, Spina Bifida without Hydrocephalus, GERD, Osteoarthritis of Knee, Congenital Dysplasia of Right Hip ; Transportation  Care Plan : Social Work [facility]  Updates made by Daneen Schick since 01/12/2021  12:00 AM     Problem: Mobility and Independence - Transportation      Goal: Mobility and Independence Optimized   Start Date: 01/12/2021  Expected End Date: 02/11/2021  Priority: Medium  Note:   Current Barriers:  Chronic disease management support and education needs related to  HTN, Spina Bifida without Hydrocephalus, GERD, Osteoarthritis of Knee, Congenital Dysplasia of Right Hip   Transportation  Social Worker Clinical Goal(s):  patient will work with SW to identify and address any acute and/or chronic care coordination needs related to the self health management of HTN, Spina Bifida without Hydrocephalus, GERD, Osteoarthritis of Knee, Congenital Dysplasia of Right Hip patient will work with SW to address concerns related to SCAT approval  SW Interventions:  Inter-disciplinary care team collaboration (see longitudinal plan of care) Collaboration with Glendale Chard, MD regarding development and update of comprehensive plan of care as evidenced by provider attestation and co-signature Collaboration with [place] who indicates patients mother worked with patients primary provider to complete a SCAT application but has not heard back regarding determination Collaboration with Almedia Balls to request update on application status  Patient Goals/Self-Care Activities patient will:   -  Work with SW to identify outcome of SCAT application  Follow Up Plan:  SW will follow up with the patient over the next 21 days       Follow Up Plan: SW will follow up with patient by phone over the next 21 days      Daneen Schick, BSW, CDP Social Worker, Certified Dementia Practitioner [place] [facility] [place] [facility] [phone]

## 2021-01-12 NOTE — Patient Instructions (Signed)
Goals Addressed      Track and Manage My Blood Pressure-Hypertension   On track    Timeframe:  Long-Range Goal Priority:  High Start Date:  07/17/20                           Expected End Date: 07/16/21                  Follow Up Date: 06/28/21   Self administers medications as prescribed Attends all scheduled provider appointments Calls provider office for new concerns, questions, or BP outside discussed parameters Checks BP and records as discussed Follows a low sodium diet/DASH diet check blood pressure 3 times per week write blood pressure results in a log or diary    Why is this important?   You won't feel high blood pressure, but it can still hurt your blood vessels.  High blood pressure can cause heart or kidney problems. It can also cause a stroke.  Making lifestyle changes like losing a [place] weight or eating less salt will help.  Checking your blood pressure at home and at different times of the day can help to control blood pressure.  If the doctor prescribes medicine remember to take it the way the doctor ordered.  Call the office if you cannot afford the medicine or if there are questions about it.     Notes:

## 2021-01-12 NOTE — Chronic Care Management (AMB) (Signed)
Chronic Care Management   CCM RN Visit Note  01/12/2021 Name: Mary Neal MRN: [id number] DOB: 06/25/1966  Subjective: Mary Neal is a 55 y.o. year old female who is a primary care patient of Glendale Chard, MD. The care management team was consulted for assistance with disease management and care coordination needs.    Engaged with patient by telephone for follow up visit in response to provider referral for case management and/or care coordination services.   Consent to Services:  The patient was given information about Chronic Care Management services, agreed to services, and gave verbal consent prior to initiation of services.  Please see initial visit note for detailed documentation.   Patient agreed to services and verbal consent obtained.   Assessment: Review of patient past medical history, allergies, medications, health status, including review of consultants reports, laboratory and other test data, was performed as part of comprehensive evaluation and provision of chronic care management services.   SDOH (Social Determinants of Health) assessments and interventions performed:  Yes, no acute challenges   CCM Care Plan  Allergies  Allergen Reactions   Clindamycin/Lincomycin Palpitations   Latex Other (See Comments)   Penicillins Other (See Comments)    Outpatient Encounter Medications as of 01/12/2021  Medication Sig   acetaminophen (TYLENOL) 325 MG tablet Take 325 mg by mouth every 6 (six) hours as needed for headache. (Patient not taking: Reported on 12/23/2020)   Cholecalciferol 1000 UNITS capsule Take 500 Units by mouth daily.   clindamycin (CLEOCIN) 300 MG capsule TAKE 2 CAPSULES 1 HOUR BEFORE DENTAL APPOINTMENT (Patient not taking: Reported on 12/23/2020)   famotidine (PEPCID) 20 MG tablet Take 1 tablet (20 mg total) by mouth daily.   Magnesium Oxide -Mg Supplement ([employer]S MAGNESIUM OXIDE) 250 MG TABS TAKE 1 TABLET (250 MG TOTAL) BY MOUTH EVERY EVENING. WITH  EVENING MEAL   Multiple Vitamin (MULTI-VITAMINS) TABS Take by mouth.   omeprazole (PRILOSEC) 40 MG capsule TAKE 1 CAPSULE BY MOUTH EVERY DAY   oxybutynin (DITROPAN XL) 15 MG 24 hr tablet Take 1 tablet (15 mg total) by mouth 2 (two) times daily. Patient takes 1 tab 2 times per day   polyethylene glycol (MIRALAX) packet Take 17 g by mouth daily as needed for moderate constipation or severe constipation. (Patient not taking: Reported on 12/23/2020)   senna (SENOKOT) 8.6 MG TABS tablet Take 1 tablet by mouth as needed. Takes 1 tab every other day   valsartan (DIOVAN) 160 MG tablet Take 1 tablet (160 mg total) by mouth daily.   vitamin C (ASCORBIC ACID) 500 MG tablet Take by mouth.   No facility-administered encounter medications on file as of 01/12/2021.    Patient Active Problem List   Diagnosis Date Noted   Visit for routine gyn exam 08/10/2020   Pelvic relaxation 08/10/2020   Essential hypertension 01/28/2020   Gastroesophageal reflux disease without esophagitis 07/12/2019   Nonrheumatic mitral valve regurgitation 05/08/2019   Atypical chest pain 04/17/2019   Neurogenic bowel 12/13/2018   Neurogenic bladder 12/07/2018   Peripheral motor neuropathy 12/07/2018   Osteoarthritis of right hip joint due to dysplasia 11/13/2018   Chronic pain of right knee 06/06/2018   Congenital dysplasia of right hip 06/06/2018   Osteoarthritis of left knee 06/17/2013   Spina bifida without hydrocephalus ([place]) 03/25/2013   Injury of ligament of left knee 03/25/2013   Paraparesis ([place]) 03/25/2013   Tear of medial collateral ligament of knee (left) 03/25/2013   Fibroids 02/14/2012    Conditions  to be addressed/monitored: Spina bifida without hydrocephalus, unspecified, GERD, Osteoarthritis of left knee, Congenital dysplasia or right hip, HTN  Care Plan : Hypertension (Adult)  Updates made by Lynne Logan, RN since 01/12/2021 12:00 AM     Problem: Disease Progression (Hypertension)   Priority: High      Long-Range Goal: Disease Progression Prevented or Minimized   Start Date: 07/17/2020  Expected End Date: 07/16/2021  Recent Progress: On track  Priority: High  Note:   CARE PLAN ENTRY (see longtitudinal plan of care for additional care plan information)  Objective:  Last practice recorded BP readings:  BP Readings from Last 3 Encounters:  12/23/20 118/62  08/10/20 (!) 112/50  07/01/20 132/76   Most recent eGFR/CrCl:  Lab Results  Component Value Date   EGFR 103 12/23/2020    No components found for: CRCL Current Barriers:  Knowledge Deficits related to basic understanding of hypertension pathophysiology and self care management Knowledge Deficits related to understanding of medications prescribed for management of hypertension Lacks social connections Unable to perform ADLs independently Unable to perform IADLs independently Case Manager Clinical Goal(s):  Patient will verbalize basic understanding of hypertension disease process and self health management plan as evidenced by patient will maintain blood pressure at target range less than 130/80 and will not experience complications secondary to uncontrolled hypertension  Interventions:  01/12/21 completed successful outbound call with patient's mother [name] Evaluation of current treatment plan related to hypertension self management and patient's adherence to plan as established by provider Provided education to patient about basic disease process related to hypertension  Review of patient status, including review of consultant's reports, relevant laboratory and other test results, and medications completed. Reviewed medications with patient and discussed importance of medication adherence Educated on dietary and exercise recommendations; Determined patient is active and participating in an exercise program outside of her home, she is eating more of a vegan diet Advised patient, providing education and rationale, to monitor blood  pressure daily and record, calling PCP for findings outside established parameters. Determined patient needs assistance with follow up for SCAT approval Collaborated with embedded BSW Daneen Schick requesting assistance to help patient determined eligibility/application status for SCAT services   Discussed plans with patient for ongoing care management follow up and provided patient with direct contact information for care management team Patient Goals/Self-Care Activities Self administers medications as prescribed Attends all scheduled provider appointments Calls provider office for new concerns, questions, or BP outside discussed parameters Checks BP and records as discussed Follows a low sodium diet/DASH diet check blood pressure 3 times per week write blood pressure results in a log or diary   Follow Up Plan: Telephone follow up appointment with care management team member scheduled for: 06/28/21    Plan:Telephone follow up appointment with care management team member scheduled for:  06/28/21  Barb Merino, RN, BSN, CCM Care Management Coordinator [place] [facility]  Direct Phone: [phone]

## 2021-01-15 DIAGNOSIS — R339 Retention of urine, unspecified: Secondary | ICD-10-CM | POA: Diagnosis not present

## 2021-01-20 ENCOUNTER — Ambulatory Visit: Payer: Medicare Other

## 2021-01-20 DIAGNOSIS — Q059 Spina bifida, unspecified: Secondary | ICD-10-CM

## 2021-01-20 DIAGNOSIS — K219 Gastro-esophageal reflux disease without esophagitis: Secondary | ICD-10-CM

## 2021-01-20 DIAGNOSIS — M1712 Unilateral primary osteoarthritis, left knee: Secondary | ICD-10-CM

## 2021-01-20 DIAGNOSIS — Q6589 Other specified congenital deformities of hip: Secondary | ICD-10-CM

## 2021-01-20 NOTE — Patient Instructions (Signed)
Social Worker Visit Information  Goals we discussed today:   Goals Addressed             This Visit's Progress    Mobility and Independence Optimized       Timeframe:  Short-Term Goal Priority:  Medium Start Date:  7.5.22                           Expected End Date: 8.4.22                       Patient Goals/Self-Care Activities patient will:  - Review mailed resource information          Materials Provided: Yes: provided resource information by mail  Follow Up Plan: SW will follow up with patient by phone over the next month   Daneen Schick, BSW, CDP Social Worker, Certified Dementia Practitioner [place] [facility] [place] [facility] [phone]

## 2021-01-20 NOTE — Chronic Care Management (AMB) (Signed)
Chronic Care Management    Social Work Note  01/20/2021 Name: Mary Neal MRN: [id number] DOB: 12-Mar-1966  Mary Neal is a 55 y.o. year old female who is a primary care patient of Mary Chard, MD. The CCM team was consulted to assist the patient with chronic disease management and/or care coordination needs related to: Transportation Needs .   Engaged with patient mom by phone  for follow up visit in response to provider referral for social work chronic care management and care coordination services.   Consent to Services:  The patient was given information about Chronic Care Management services, agreed to services, and gave verbal consent prior to initiation of services.  Please see initial visit note for detailed documentation.   Patient agreed to services and consent obtained.   Assessment: Review of patient past medical history, allergies, medications, and health status, including review of relevant consultants reports was performed today as part of a comprehensive evaluation and provision of chronic care management and care coordination services.     SDOH (Social Determinants of Health) assessments and interventions performed:    Advanced Directives Status: Not addressed in this encounter.  CCM Care Plan  Allergies  Allergen Reactions   Clindamycin/Lincomycin Palpitations   Latex Other (See Comments)   Penicillins Other (See Comments)    Outpatient Encounter Medications as of 01/20/2021  Medication Sig   acetaminophen (TYLENOL) 325 MG tablet Take 325 mg by mouth every 6 (six) hours as needed for headache. (Patient not taking: Reported on 12/23/2020)   Cholecalciferol 1000 UNITS capsule Take 500 Units by mouth daily.   clindamycin (CLEOCIN) 300 MG capsule TAKE 2 CAPSULES 1 HOUR BEFORE DENTAL APPOINTMENT (Patient not taking: Reported on 12/23/2020)   famotidine (PEPCID) 20 MG tablet Take 1 tablet (20 mg total) by mouth daily.   Magnesium Oxide -Mg Supplement ([employer]  MAGNESIUM OXIDE) 250 MG TABS TAKE 1 TABLET (250 MG TOTAL) BY MOUTH EVERY EVENING. WITH EVENING MEAL   Multiple Vitamin (MULTI-VITAMINS) TABS Take by mouth.   omeprazole (PRILOSEC) 40 MG capsule TAKE 1 CAPSULE BY MOUTH EVERY DAY   oxybutynin (DITROPAN XL) 15 MG 24 hr tablet Take 1 tablet (15 mg total) by mouth 2 (two) times daily. Patient takes 1 tab 2 times per day   polyethylene glycol (MIRALAX) packet Take 17 g by mouth daily as needed for moderate constipation or severe constipation. (Patient not taking: Reported on 12/23/2020)   senna (SENOKOT) 8.6 MG TABS tablet Take 1 tablet by mouth as needed. Takes 1 tab every other day   valsartan (DIOVAN) 160 MG tablet Take 1 tablet (160 mg total) by mouth daily.   vitamin C (ASCORBIC ACID) 500 MG tablet Take by mouth.   No facility-administered encounter medications on file as of 01/20/2021.    Patient Active Problem List   Diagnosis Date Noted   Visit for routine gyn exam 08/10/2020   Pelvic relaxation 08/10/2020   Essential hypertension 01/28/2020   Gastroesophageal reflux disease without esophagitis 07/12/2019   Nonrheumatic mitral valve regurgitation 05/08/2019   Atypical chest pain 04/17/2019   Neurogenic bowel 12/13/2018   Neurogenic bladder 12/07/2018   Peripheral motor neuropathy 12/07/2018   Osteoarthritis of right hip joint due to dysplasia 11/13/2018   Chronic pain of right knee 06/06/2018   Congenital dysplasia of right hip 06/06/2018   Osteoarthritis of left knee 06/17/2013   Spina bifida without hydrocephalus ([place]) 03/25/2013   Injury of ligament of left knee 03/25/2013   Paraparesis ([place]) 03/25/2013  Tear of medial collateral ligament of knee (left) 03/25/2013   Fibroids 02/14/2012    Conditions to be addressed/monitored:  Spina Bifida without Hydrocephalus, GERD, Osteoarthritis of knee, Congenital Dysplasia of Right Hip ; Transportation  Care Plan : Social Work [facility]  Updates made by Mary Neal since 01/20/2021 12:00  AM     Problem: Mobility and Independence - Transportation      Goal: Mobility and Independence Optimized   Start Date: 01/12/2021  Expected End Date: 02/11/2021  This Visit's Progress: On track  Priority: Medium  Note:   Current Barriers:  Chronic disease management support and education needs related to  HTN, Spina Bifida without Hydrocephalus, GERD, Osteoarthritis of Knee, Congenital Dysplasia of Right Hip   Transportation  Social Worker Clinical Goal(s):  patient will work with SW to identify and address any acute and/or chronic care coordination needs related to the self health management of HTN, Spina Bifida without Hydrocephalus, GERD, Osteoarthritis of Knee, Congenital Dysplasia of Right Hip patient will work with SW to address concerns related to SCAT approval  SW Interventions:  Inter-disciplinary care team collaboration (see longitudinal plan of care) Collaboration with Mary Chard, MD regarding development and update of comprehensive plan of care as evidenced by provider attestation and co-signature Collaboration with Mary Neal with SCAT who indicates patients SCAT eligibility ended in 2016 and a renewal has yet to be received Successful outbound call placed to the patients daughter to assist with a SCAT application Discussed the patient currently lives outside of the [place] service area Determined the patient would like transportation resources to assist with going places like shopping and to the movies Educated [name] on [facility] program Completed and submitted application to Mary Neal for review Mailed Mary Neal brochure to the patients home for review Scheduled follow up call over the next month  Patient Goals/Self-Care Activities patient will:   -  Review mailed resource information  Follow Up Plan:  SW will follow up with the patient over the next 21 days       Follow Up Plan: SW will follow up with patient by phone over the next month       Mary Neal, BSW, CDP Social Worker, Certified Dementia Practitioner [place] [facility] [place] [facility] [phone]

## 2021-02-03 ENCOUNTER — Ambulatory Visit: Payer: Medicare Other

## 2021-02-03 DIAGNOSIS — Q6589 Other specified congenital deformities of hip: Secondary | ICD-10-CM

## 2021-02-03 DIAGNOSIS — K219 Gastro-esophageal reflux disease without esophagitis: Secondary | ICD-10-CM

## 2021-02-03 DIAGNOSIS — I10 Essential (primary) hypertension: Secondary | ICD-10-CM

## 2021-02-03 DIAGNOSIS — Q059 Spina bifida, unspecified: Secondary | ICD-10-CM

## 2021-02-03 NOTE — Chronic Care Management (AMB) (Signed)
Chronic Care Management    Social Work Note  02/03/2021 Name: Mary Neal MRN: [id number] DOB: [birth year]  Mary Neal is a 55 y.o. year old female who is a primary care patient of Mary Chard, MD. The CCM team was consulted to assist the patient with chronic disease management and/or care coordination needs related to: Transportation Needs .   Engaged with patients mom  for follow up visit in response to provider referral for social work chronic care management and care coordination services.   Consent to Services:  The patient was given information about Chronic Care Management services, agreed to services, and gave verbal consent prior to initiation of services.  Please see initial visit note for detailed documentation.   Patient agreed to services and consent obtained.   Assessment: Review of patient past medical history, allergies, medications, and health status, including review of relevant consultants reports was performed today as part of a comprehensive evaluation and provision of chronic care management and care coordination services.     SDOH (Social Determinants of Health) assessments and interventions performed:    Advanced Directives Status: Not addressed in this encounter.  CCM Care Plan  Allergies  Allergen Reactions   Clindamycin/Lincomycin Palpitations   Latex Other (See Comments)   Penicillins Other (See Comments)    Outpatient Encounter Medications as of 02/03/2021  Medication Sig   acetaminophen (TYLENOL) 325 MG tablet Take 325 mg by mouth every 6 (six) hours as needed for headache. (Patient not taking: Reported on 12/23/2020)   Cholecalciferol 1000 UNITS capsule Take 500 Units by mouth daily.   clindamycin (CLEOCIN) 300 MG capsule TAKE 2 CAPSULES 1 HOUR BEFORE DENTAL APPOINTMENT (Patient not taking: Reported on 12/23/2020)   famotidine (PEPCID) 20 MG tablet Take 1 tablet (20 mg total) by mouth daily.   Magnesium Oxide -Mg Supplement ([employer]  MAGNESIUM OXIDE) 250 MG TABS TAKE 1 TABLET (250 MG TOTAL) BY MOUTH EVERY EVENING. WITH EVENING MEAL   Multiple Vitamin (MULTI-VITAMINS) TABS Take by mouth.   omeprazole (PRILOSEC) 40 MG capsule TAKE 1 CAPSULE BY MOUTH EVERY DAY   oxybutynin (DITROPAN XL) 15 MG 24 hr tablet Take 1 tablet (15 mg total) by mouth 2 (two) times daily. Patient takes 1 tab 2 times per day   polyethylene glycol (MIRALAX) packet Take 17 g by mouth daily as needed for moderate constipation or severe constipation. (Patient not taking: Reported on 12/23/2020)   senna (SENOKOT) 8.6 MG TABS tablet Take 1 tablet by mouth as needed. Takes 1 tab every other day   valsartan (DIOVAN) 160 MG tablet Take 1 tablet (160 mg total) by mouth daily.   vitamin C (ASCORBIC ACID) 500 MG tablet Take by mouth.   No facility-administered encounter medications on file as of 02/03/2021.    Patient Active Problem List   Diagnosis Date Noted   Visit for routine gyn exam 08/10/2020   Pelvic relaxation 08/10/2020   Essential hypertension 01/28/2020   Gastroesophageal reflux disease without esophagitis 07/12/2019   Nonrheumatic mitral valve regurgitation 05/08/2019   Atypical chest pain 04/17/2019   Neurogenic bowel 12/13/2018   Neurogenic bladder 12/07/2018   Peripheral motor neuropathy 12/07/2018   Osteoarthritis of right hip joint due to dysplasia 11/13/2018   Chronic pain of right knee 06/06/2018   Congenital dysplasia of right hip 06/06/2018   Osteoarthritis of left knee 06/17/2013   Spina bifida without hydrocephalus ([place]) 03/25/2013   Injury of ligament of left knee 03/25/2013   Paraparesis ([place]) 03/25/2013   Tear  of medial collateral ligament of knee (left) 03/25/2013   Fibroids 02/14/2012    Conditions to be addressed/monitored:  Spina Bifida without Hydrocephalus, GERD, HTN, and Congenital Dysplasia of Right Hip ; Transportation  Care Plan : Social Work [facility]  Updates made by Mary Neal since 02/03/2021 12:00 AM      Problem: Mobility and Independence - Transportation      Goal: Mobility and Independence Optimized   Start Date: 01/12/2021  Expected End Date: 02/11/2021  Recent Progress: On track  Priority: Medium  Note:   Current Barriers:  Chronic disease management support and education needs related to  HTN, Spina Bifida without Hydrocephalus, GERD, Osteoarthritis of Knee, Congenital Dysplasia of Right Hip   Transportation  Social Worker Clinical Goal(s):  patient will work with SW to identify and address any acute and/or chronic care coordination needs related to the self health management of HTN, Spina Bifida without Hydrocephalus, GERD, Osteoarthritis of Knee, Congenital Dysplasia of Right Hip patient will work with SW to address concerns related to SCAT approval  SW Interventions:  Inter-disciplinary care team collaboration (see longitudinal plan of care) Collaboration with Mary Chard, MD regarding development and update of comprehensive plan of care as evidenced by provider attestation and co-signature Collaboration with Mary Neal with TAMS who indicates patient will need to apply for SCAT as she lives within the [place] service area Successful outbound call placed to the patients [name] to update her on plans for SW to apply for SCAT on behalf of the patient Discussed families concern that a SCAT application was completed but never submitted by the patients provider Advised SW would follow up with the patients primary provider to request a copy, if no copy is provided SW will complete a new application Collaboration with Mary Neal CMA who was able to provide SW with a copy of patients application Submitted SCAT application to [employer] who advised SW the patient does not live within the SCAT service area and patient would need to apply with TAMS Discussed SW was instructed by TAMS the patient would have to apply with SCAT Collaboration with Mary Neal to request  feedback regarding TAMS eligibility and patients ability to use the service to access hobbies such as movies and shopping   Patient Goals/Self-Care Activities patient will:   - Work with SW to identify transportation resources  Follow Up Plan:  SW will follow up with the patient over the next 10 days       Follow Up Plan: SW will follow up with patient by phone over the next 10 days      Mary Neal, BSW, CDP Social Worker, Certified Dementia Practitioner [place] [facility] [place] [facility] [phone]

## 2021-02-03 NOTE — Patient Instructions (Signed)
Social Worker Visit Information  Goals we discussed today:   Goals Addressed             This Visit's Progress    Mobility and Independence Optimized       Timeframe:  Short-Term Goal Priority:  Medium Start Date:  7.5.22                           Expected End Date: 8.4.22                       Patient Goals/Self-Care Activities patient will:  -Work with SW to identify transportation resources         Materials Provided: Verbal education about transportation resources provided by phone  Follow Up Plan: SW will follow up with patient by phone over the next week   Daneen Schick, BSW, CDP Social Worker, Certified Dementia Practitioner [place] [facility] [facility] [phone]

## 2021-02-08 ENCOUNTER — Ambulatory Visit (INDEPENDENT_AMBULATORY_CARE_PROVIDER_SITE_OTHER): Payer: Medicare Other

## 2021-02-08 DIAGNOSIS — Q6589 Other specified congenital deformities of hip: Secondary | ICD-10-CM

## 2021-02-08 DIAGNOSIS — M1712 Unilateral primary osteoarthritis, left knee: Secondary | ICD-10-CM

## 2021-02-08 DIAGNOSIS — Q059 Spina bifida, unspecified: Secondary | ICD-10-CM

## 2021-02-08 NOTE — Patient Instructions (Signed)
Social Worker Visit Information  Goals we discussed today:   Goals Addressed             This Visit's Progress    Mobility and Independence Optimized   On track    Timeframe:  Short-Term Goal Priority:  Medium Start Date:  7.5.22                           Expected End Date: 8.4.22                       Patient Goals/Self-Care Activities patient will:   - [place] or SCAT with transportation needs         Materials Provided: Verbal education about transportation resources provided by phone  Follow Up Plan: SW will follow up with patient by phone over the next month   Daneen Schick, BSW, CDP Social Worker, Certified Dementia Practitioner [place] [facility] [place] [facility] [phone]

## 2021-02-08 NOTE — Chronic Care Management (AMB) (Signed)
Chronic Care Management    Social Work Note  02/08/2021 Name: Mary Neal MRN: [id number] DOB: May 12, 1966  Mary Neal is a 55 y.o. year old female who is a primary care patient of Glendale Chard, MD. The CCM team was consulted to assist the patient with chronic disease management and/or care coordination needs related to: Transportation Needs .   Engaged with patients [name]  for follow up visit in response to provider referral for social work chronic care management and care coordination services.   Consent to Services:  The patient was given information about Chronic Care Management services, agreed to services, and gave verbal consent prior to initiation of services.  Please see initial visit note for detailed documentation.   Patient agreed to services and consent obtained.   Assessment: Review of patient past medical history, allergies, medications, and health status, including review of relevant consultants reports was performed today as part of a comprehensive evaluation and provision of chronic care management and care coordination services.     SDOH (Social Determinants of Health) assessments and interventions performed:    Advanced Directives Status: Not addressed in this encounter.  CCM Care Plan  Allergies  Allergen Reactions   Clindamycin/Lincomycin Palpitations   Latex Other (See Comments)   Penicillins Other (See Comments)    Outpatient Encounter Medications as of 02/08/2021  Medication Sig   acetaminophen (TYLENOL) 325 MG tablet Take 325 mg by mouth every 6 (six) hours as needed for headache. (Patient not taking: Reported on 12/23/2020)   Cholecalciferol 1000 UNITS capsule Take 500 Units by mouth daily.   clindamycin (CLEOCIN) 300 MG capsule TAKE 2 CAPSULES 1 HOUR BEFORE DENTAL APPOINTMENT (Patient not taking: Reported on 12/23/2020)   famotidine (PEPCID) 20 MG tablet Take 1 tablet (20 mg total) by mouth daily.   Magnesium Oxide -Mg  Supplement ([employer] MAGNESIUM OXIDE) 250 MG TABS TAKE 1 TABLET (250 MG TOTAL) BY MOUTH EVERY EVENING. WITH EVENING MEAL   Multiple Vitamin (MULTI-VITAMINS) TABS Take by mouth.   omeprazole (PRILOSEC) 40 MG capsule TAKE 1 CAPSULE BY MOUTH EVERY DAY   oxybutynin (DITROPAN XL) 15 MG 24 hr tablet Take 1 tablet (15 mg total) by mouth 2 (two) times daily. Patient takes 1 tab 2 times per day   polyethylene glycol (MIRALAX) packet Take 17 g by mouth daily as needed for moderate constipation or severe constipation. (Patient not taking: Reported on 12/23/2020)   senna (SENOKOT) 8.6 MG TABS tablet Take 1 tablet by mouth as needed. Takes 1 tab every other day   valsartan (DIOVAN) 160 MG tablet Take 1 tablet (160 mg total) by mouth daily.   vitamin C (ASCORBIC ACID) 500 MG tablet Take by mouth.   No facility-administered encounter medications on file as of 02/08/2021.    Patient Active Problem List   Diagnosis Date Noted   Visit for routine gyn exam 08/10/2020   Pelvic relaxation 08/10/2020   Essential hypertension 01/28/2020   Gastroesophageal reflux disease without esophagitis 07/12/2019   Nonrheumatic mitral valve regurgitation 05/08/2019   Atypical chest pain 04/17/2019   Neurogenic bowel 12/13/2018   Neurogenic bladder 12/07/2018   Peripheral motor neuropathy 12/07/2018   Osteoarthritis of right hip joint due to dysplasia 11/13/2018   Chronic pain of right knee 06/06/2018   Congenital dysplasia of right hip 06/06/2018   Osteoarthritis of left knee 06/17/2013   Spina bifida without hydrocephalus ([place]) 03/25/2013   Injury of ligament of left knee 03/25/2013   Paraparesis ([place]) 03/25/2013  Tear of medial collateral ligament of knee (left) 03/25/2013   Fibroids 02/14/2012    Conditions to be addressed/monitored:  Spina Bifida without Hydrocephalus, Congenital Dysplasia of Right Hip, and Osteoarthritis of Knee ; Transportation  Care Plan : Social Work [facility]  Updates made by Daneen Schick since  02/08/2021 12:00 AM     Problem: Mobility and Independence - Transportation      Goal: Mobility and Independence Optimized   Start Date: 01/12/2021  Expected End Date: 02/11/2021  This Visit's Progress: On track  Recent Progress: On track  Priority: Medium  Note:   Current Barriers:  Chronic disease management support and education needs related to  HTN, Spina Bifida without Hydrocephalus, GERD, Osteoarthritis of Knee, Congenital Dysplasia of Right Hip   Transportation  Social Worker Clinical Goal(s):  patient will work with SW to identify and address any acute and/or chronic care coordination needs related to the self health management of HTN, Spina Bifida without Hydrocephalus, GERD, Osteoarthritis of Knee, Congenital Dysplasia of Right Hip patient will work with SW to address concerns related to SCAT approval  SW Interventions:  Inter-disciplinary care team collaboration (see longitudinal plan of care) Collaboration with Glendale Chard, MD regarding development and update of comprehensive plan of care as evidenced by provider attestation and co-signature Collaboration with Callie Fielding with TAMS who indicates she has approved patients application Collaboration with Almedia Balls with SCAT who confirms receipt of application and will process the patients application in the event she needs to utilize services within the SCAT service area Successful outbound call placed to the patients [name] to provide an update on transportation options Confirmed Mary Neal does have the [employer] brochure with the contact number for when she needs to arrange transportation Discussed the patient should received information in the mail from [place] regarding eligibility once her application is processed Advised Mary Neal to contact SW if barriers occur with either resource Confirmed knowledge of patients health plan transportation benefit for medical needs Scheduled follow up call over the next  month   Patient Goals/Self-Care Activities patient will:   - [place] or SCAT with transportation needs  Follow Up Plan:  SW will follow up with the patient over the next month       Follow Up Plan: SW will follow up with patient by phone over the next month      Daneen Schick, BSW, CDP Social Worker, Certified Dementia Practitioner [place] [facility] [place] [facility] [phone]

## 2021-02-11 ENCOUNTER — Telehealth: Payer: Medicare Other

## 2021-02-17 DIAGNOSIS — R339 Retention of urine, unspecified: Secondary | ICD-10-CM | POA: Diagnosis not present

## 2021-03-12 ENCOUNTER — Telehealth: Payer: Self-pay

## 2021-03-12 ENCOUNTER — Telehealth: Payer: Medicare Other

## 2021-03-12 NOTE — Telephone Encounter (Signed)
  Care Management   Follow Up Note   03/12/2021 Name: Mary Neal MRN: [id number] DOB: 01/12/1966   Referred by: Glendale Chard, MD Reason for referral : Chronic Care Management (Unsuccessful call)   An unsuccessful telephone outreach was attempted today. The patient was referred to the case management team for assistance with care management and care coordination. SW left a HIPAA compliant voice message requesting a return call.  Follow Up Plan: The care management team will reach out to the patient again over the next 21 days.   Daneen Schick, BSW, CDP Social Worker, Certified Dementia Practitioner [place] [facility] [place] [facility] [phone]

## 2021-03-19 ENCOUNTER — Other Ambulatory Visit: Payer: Self-pay | Admitting: Internal Medicine

## 2021-03-22 DIAGNOSIS — R339 Retention of urine, unspecified: Secondary | ICD-10-CM | POA: Diagnosis not present

## 2021-03-24 ENCOUNTER — Ambulatory Visit (INDEPENDENT_AMBULATORY_CARE_PROVIDER_SITE_OTHER): Payer: Medicare Other

## 2021-03-24 DIAGNOSIS — K219 Gastro-esophageal reflux disease without esophagitis: Secondary | ICD-10-CM

## 2021-03-24 DIAGNOSIS — I10 Essential (primary) hypertension: Secondary | ICD-10-CM

## 2021-03-24 DIAGNOSIS — Q6589 Other specified congenital deformities of hip: Secondary | ICD-10-CM

## 2021-03-24 DIAGNOSIS — Q059 Spina bifida, unspecified: Secondary | ICD-10-CM

## 2021-03-24 NOTE — Patient Instructions (Addendum)
Social Worker Visit Information  Goals we discussed today:   Goals Addressed             This Visit's Progress    Mobility and Independence Optimized   On track    Timeframe:  Short-Term Goal Priority:  Medium Start Date:  7.5.22                           Expected End Date: 8.4.22    Next planned outreach: 1.11.23                     Patient Goals/Self-Care Activities patient will:   - [place] or [facility]AT with transportation needs -Contact SW as needed prior to next scheduled call         Patient verbalizes understanding of instructions provided today and agrees to view in [place].   Follow Up Plan: SW will follow up with patient by phone over the next 6 months  Daneen Schick, BSW, CDP Social Worker, Certified Dementia Practitioner [place] [facility] [place] [facility] [phone]

## 2021-03-25 NOTE — Chronic Care Management (AMB) (Signed)
Chronic Care Management    Social Work Note  03/25/2021 Name: Mary Neal MRN: [id number] DOB: February 23, 1966  Mary Neal is a 55 y.o. year old female who is a primary care patient of Mary Chard, MD. The CCM team was consulted to assist the patient with chronic disease management and/or care coordination needs related to:  Spina Bifida without Hydrocephalus, GERD, and Congenital Dysplasia of Right Hip .   Engaged with patients mom by phone  for follow up visit in response to provider referral for social work chronic care management and care coordination services.   Consent to Services:  The patient was given information about Chronic Care Management services, agreed to services, and gave verbal consent prior to initiation of services.  Please see initial visit note for detailed documentation.   Patient agreed to services and consent obtained.   Assessment: Review of patient past medical history, allergies, medications, and health status, including review of relevant consultants reports was performed today as part of a comprehensive evaluation and provision of chronic care management and care coordination services.     SDOH (Social Determinants of Health) assessments and interventions performed:    Advanced Directives Status: Not addressed in this encounter.  CCM Care Plan  Allergies  Allergen Reactions   Clindamycin/Lincomycin Palpitations   Latex Other (See Comments)   Penicillins Other (See Comments)    Outpatient Encounter Medications as of 03/24/2021  Medication Sig   acetaminophen (TYLENOL) 325 MG tablet Take 325 mg by mouth every 6 (six) hours as needed for headache. (Patient not taking: Reported on 12/23/2020)   Cholecalciferol 1000 UNITS capsule Take 500 Units by mouth daily.   clindamycin (CLEOCIN) 300 MG capsule TAKE 2 CAPSULES 1 HOUR BEFORE DENTAL APPOINTMENT (Patient not taking: Reported on 12/23/2020)   famotidine (PEPCID) 20 MG tablet TAKE 1 TABLET BY  MOUTH EVERY DAY   Magnesium Oxide -Mg Supplement ([employer] MAGNESIUM OXIDE) 250 MG TABS TAKE 1 TABLET (250 MG TOTAL) BY MOUTH EVERY EVENING. WITH EVENING MEAL   Multiple Vitamin (MULTI-VITAMINS) TABS Take by mouth.   omeprazole (PRILOSEC) 40 MG capsule TAKE 1 CAPSULE BY MOUTH EVERY DAY   oxybutynin (DITROPAN XL) 15 MG 24 hr tablet Take 1 tablet (15 mg total) by mouth 2 (two) times daily. Patient takes 1 tab 2 times per day   polyethylene glycol (MIRALAX) packet Take 17 g by mouth daily as needed for moderate constipation or severe constipation. (Patient not taking: Reported on 12/23/2020)   senna (SENOKOT) 8.6 MG TABS tablet Take 1 tablet by mouth as needed. Takes 1 tab every other day   valsartan (DIOVAN) 160 MG tablet Take 1 tablet (160 mg total) by mouth daily.   vitamin C (ASCORBIC ACID) 500 MG tablet Take by mouth.   No facility-administered encounter medications on file as of 03/24/2021.    Patient Active Problem List   Diagnosis Date Noted   Visit for routine gyn exam 08/10/2020   Pelvic relaxation 08/10/2020   Essential hypertension 01/28/2020   Gastroesophageal reflux disease without esophagitis 07/12/2019   Nonrheumatic mitral valve regurgitation 05/08/2019   Atypical chest pain 04/17/2019   Neurogenic bowel 12/13/2018   Neurogenic bladder 12/07/2018   Peripheral motor neuropathy 12/07/2018   Osteoarthritis of right hip joint due to dysplasia 11/13/2018   Chronic pain of right knee 06/06/2018   Congenital dysplasia of right hip 06/06/2018   Osteoarthritis of left knee 06/17/2013   Spina bifida without hydrocephalus ([place]) 03/25/2013   Injury of ligament of left  knee 03/25/2013   Paraparesis ([place]) 03/25/2013   Tear of medial collateral ligament of knee (left) 03/25/2013   Fibroids 02/14/2012    Conditions to be addressed/monitored:  Spina Bifida without Hydrocephalus, GERD, and Congenital Dysplasia of Right Hip ; Transportation  Care Plan : Social Work [facility]  Updates made by  Mary Neal since 03/25/2021 12:00 AM     Problem: Mobility and Independence - Transportation      Goal: Mobility and Independence Optimized   Start Date: 01/12/2021  Expected End Date: 02/11/2021  This Visit's Progress: On track  Recent Progress: On track  Priority: Medium  Note:   Current Barriers:  Chronic disease management support and education needs related to  HTN, Spina Bifida without Hydrocephalus, GERD, Osteoarthritis of Knee, Congenital Dysplasia of Right Hip   Transportation  Social Worker Clinical Goal(s):  patient will work with SW to identify and address any acute and/or chronic care coordination needs related to the self health management of HTN, Spina Bifida without Hydrocephalus, GERD, Osteoarthritis of Knee, Congenital Dysplasia of Right Hip patient will work with SW to address concerns related to SCAT approval  SW Interventions:  Inter-disciplinary care team collaboration (see longitudinal plan of care) Collaboration with Mary Chard, MD regarding development and update of comprehensive plan of care as evidenced by provider attestation and co-signature Collaboration with Mary Neal with TAMS who indicates she has approved patients application Collaboration with Mary Neal with SCAT who confirms receipt of application and will process the patients application in the event she needs to utilize services within the SCAT service area Successful outbound call placed to the patients [name] to assess goal progression Determined the patient has received approval to access SCAT services - unfortunately SCAT will not come to the patients home so in order for the patient to access this service for transportation her mother must transport her into the city limits for pick up Discussed patient is eligible to access [employer]) which will pick the patient up from her home and transport to physician  appointments Assessed for SW [facility] needs - no acute challenges identified at this time Scheduled follow up all over the next 6 months  Patient Goals/Self-Care Activities patient will:   - [place] or SCAT with transportation needs -Contact SW as needed prior to next scheduled call  Follow Up Plan:  SW will follow up with the patient over the next 6 months       Follow Up Plan: SW will follow up with patient by phone over the next 6 months      Mary Neal, BSW, CDP Social Worker, Certified Dementia Practitioner [place] [facility] [place] [facility] [phone]

## 2021-04-09 DIAGNOSIS — I10 Essential (primary) hypertension: Secondary | ICD-10-CM | POA: Diagnosis not present

## 2021-04-21 DIAGNOSIS — R339 Retention of urine, unspecified: Secondary | ICD-10-CM | POA: Diagnosis not present

## 2021-04-28 DIAGNOSIS — M25462 Effusion, left knee: Secondary | ICD-10-CM | POA: Diagnosis not present

## 2021-04-28 DIAGNOSIS — Z471 Aftercare following joint replacement surgery: Secondary | ICD-10-CM | POA: Diagnosis not present

## 2021-04-28 DIAGNOSIS — Z96652 Presence of left artificial knee joint: Secondary | ICD-10-CM | POA: Diagnosis not present

## 2021-04-28 DIAGNOSIS — Z96641 Presence of right artificial hip joint: Secondary | ICD-10-CM | POA: Diagnosis not present

## 2021-05-21 DIAGNOSIS — R339 Retention of urine, unspecified: Secondary | ICD-10-CM | POA: Diagnosis not present

## 2021-06-14 ENCOUNTER — Other Ambulatory Visit: Payer: Self-pay | Admitting: Internal Medicine

## 2021-06-14 MED ORDER — AZITHROMYCIN 500 MG PO TABS: ORAL_TABLET | ORAL | 1 refills | Status: DC

## 2021-06-16 DIAGNOSIS — N390 Urinary tract infection, site not specified: Secondary | ICD-10-CM | POA: Diagnosis not present

## 2021-06-16 DIAGNOSIS — N319 Neuromuscular dysfunction of bladder, unspecified: Secondary | ICD-10-CM | POA: Diagnosis not present

## 2021-06-23 DIAGNOSIS — R339 Retention of urine, unspecified: Secondary | ICD-10-CM | POA: Diagnosis not present

## 2021-06-24 ENCOUNTER — Ambulatory Visit (INDEPENDENT_AMBULATORY_CARE_PROVIDER_SITE_OTHER): Payer: Medicare Other | Admitting: Internal Medicine

## 2021-06-24 ENCOUNTER — Encounter: Payer: Self-pay | Admitting: Internal Medicine

## 2021-06-24 ENCOUNTER — Other Ambulatory Visit: Payer: Self-pay

## 2021-06-24 VITALS — BP 128/64 | HR 72 | Temp 98.9°F | Ht <= 58 in | Wt 111.0 lb

## 2021-06-24 DIAGNOSIS — R7309 Other abnormal glucose: Secondary | ICD-10-CM | POA: Diagnosis not present

## 2021-06-24 DIAGNOSIS — I10 Essential (primary) hypertension: Secondary | ICD-10-CM | POA: Diagnosis not present

## 2021-06-24 DIAGNOSIS — E559 Vitamin D deficiency, unspecified: Secondary | ICD-10-CM | POA: Diagnosis not present

## 2021-06-24 DIAGNOSIS — R252 Cramp and spasm: Secondary | ICD-10-CM | POA: Diagnosis not present

## 2021-06-24 DIAGNOSIS — Z6824 Body mass index (BMI) 24.0-24.9, adult: Secondary | ICD-10-CM | POA: Diagnosis not present

## 2021-06-24 NOTE — Patient Instructions (Signed)

## 2021-06-24 NOTE — Progress Notes (Signed)
Rich Brave [place],acting as a Education administrator for Maximino Greenland, MD.,have documented all relevant documentation on the behalf of Maximino Greenland, MD,as directed by  Maximino Greenland, MD while in the presence of Maximino Greenland, MD.  This visit occurred during the SARS-CoV-2 public health emergency.  Safety protocols were in place, including screening questions prior to the visit, additional usage of staff PPE, and extensive cleaning of exam room while observing appropriate contact time as indicated for disinfecting solutions.  Subjective:     Patient ID: Mary Neal , female    DOB: October 07, 1965 , 55 y.o.   MRN: [id number]   Chief Complaint  Patient presents with   Hypertension    HPI  She presents today for BP check. She reports compliance with meds. She is accompanied by her Mother today. She denies headaches, palpitations and shortness of breath.   Hypertension This is a chronic problem. The problem has been gradually improving since onset. The problem is controlled. Pertinent negatives include no blurred vision, chest pain, headaches, palpitations or shortness of breath. Past treatments include angiotensin blockers. The current treatment provides moderate improvement.    Past Medical History:  Diagnosis Date   Abnormal Pap smear 2013   Dislocation of right hip ([place]) 06/09/2018   Per MRI   History of knee replacement 2005   Spina bifida [facility])    Uterine prolapse    Wears glasses      Family History  Problem Relation Age of Onset   Hypertension Mother    Hyperlipidemia Mother    Heart attack Brother      Current Outpatient Medications:    Digestive Enzymes (SUPER PAPAYA PLUS PO), Take 1 capsule by mouth daily at 2 am., Disp: , Rfl:    famotidine (PEPCID) 20 MG tablet, TAKE 1 TABLET BY MOUTH EVERY DAY, Disp: 90 tablet, Rfl: 1   oxybutynin (DITROPAN XL) 15 MG 24 hr tablet, Take 1 tablet (15 mg total) by mouth 2 (two) times daily. Patient takes 1 tab 2 times per day, Disp:  180 tablet, Rfl: 1   UNABLE TO FIND, 1 capsule daily at 2 am. Med Name: enzyme plus, Disp: , Rfl:    vitamin C (ASCORBIC ACID) 500 MG tablet, Take by mouth., Disp: , Rfl:    Magnesium 300 MG CAPS, Take by mouth., Disp: , Rfl:    meloxicam (MOBIC) 7.5 MG tablet, Take 1 tablet by mouth x 5 days then one tab daily as needed, Disp: 30 tablet, Rfl: 1   valsartan (DIOVAN) 160 MG tablet, TAKE 1 TABLET BY MOUTH EVERY DAY, Disp: 90 tablet, Rfl: 1   Allergies  Allergen Reactions   Clindamycin/Lincomycin Palpitations   Latex Other (See Comments)   Penicillins Other (See Comments)     Review of Systems  Constitutional: Negative.   Eyes:  Negative for blurred vision.  Respiratory: Negative.  Negative for shortness of breath.   Cardiovascular: Negative.  Negative for chest pain and palpitations.  Gastrointestinal: Negative.   Musculoskeletal:  Positive for myalgias.  Neurological: Negative.  Negative for headaches.  Psychiatric/Behavioral: Negative.      Today's Vitals   06/24/21 1412  BP: 128/64  Pulse: 72  Temp: 98.9 F (37.2 C)  Weight: 111 lb (50.3 kg)  Height: 4' 9"  (1.448 m)  PainSc: 0-No pain   Body mass index is 24.02 kg/m.  Wt Readings from Last 3 Encounters:  07/07/21 95 lb (43.1 kg)  07/07/21 95 lb (43.1 kg)  06/24/21 111 lb (  50.3 kg)     Objective:  Physical Exam Vitals and nursing note reviewed.  Constitutional:      Appearance: Normal appearance.  HENT:     Head: Normocephalic and atraumatic.     Nose:     Comments: Masked     Mouth/Throat:     Comments: Masked  Eyes:     Extraocular Movements: Extraocular movements intact.  Cardiovascular:     Rate and Rhythm: Normal rate and regular rhythm.     Heart sounds: Normal heart sounds.  Pulmonary:     Effort: Pulmonary effort is normal.     Breath sounds: Normal breath sounds.  Musculoskeletal:     Cervical back: Normal range of motion.  Skin:    General: Skin is warm.  Neurological:     General: No focal  deficit present.     Mental Status: She is alert.  Psychiatric:        Mood and Affect: Mood normal.        Behavior: Behavior normal.        Assessment And Plan:     1. Essential hypertension, benign Comments: Chronic, well controlled. Encouraged to follow low sodium diet.  No med changes today.  I will check renal function. She will f/u in 4-6 months.  - CMP14+EGFR - TSH  2. Muscle cramps Comments: She is advised to start Mg supplementation. I will decrease dosing if diarrhea develops.  - CK, total - Magnesium  3. Other abnormal glucose Comments: Her a1c has been elevated in the past. I will recheck this today. Encouraged to limit intake of sweetened beverages, diet drinks and sugary foods.  - Hemoglobin A1c  4. Vitamin D deficiency disease Comments: I will check vitamin D level and supplement as needed.   5. BMI 24.0-24.9, adult Comments: She is encouraged to perform chair exercises while watching TV.     Patient was given opportunity to ask questions. Patient verbalized understanding of the plan and was able to repeat key elements of the plan. All questions were answered to their satisfaction.   I, Maximino Greenland, MD, have reviewed all documentation for this visit. The documentation on 06/24/21 for the exam, diagnosis, procedures, and orders are all accurate and complete.   IF YOU HAVE BEEN REFERRED TO A SPECIALIST, IT MAY TAKE 1-2 WEEKS TO SCHEDULE/PROCESS THE REFERRAL. IF YOU HAVE NOT HEARD FROM US/SPECIALIST IN TWO WEEKS, PLEASE GIVE [place] A CALL AT [phone].   THE PATIENT IS ENCOURAGED TO PRACTICE SOCIAL DISTANCING DUE TO THE COVID-19 PANDEMIC.

## 2021-06-25 LAB — CMP14+EGFR
ALT: 33 IU/L — ABNORMAL HIGH (ref 0–32)
AST: 30 IU/L (ref 0–40)
Albumin/Globulin Ratio: 2 (ref 1.2–2.2)
Albumin: 4 g/dL (ref 3.8–4.9)
Alkaline Phosphatase: 80 IU/L (ref 44–121)
BUN/Creatinine Ratio: 34 — ABNORMAL HIGH (ref 9–23)
BUN: 20 mg/dL (ref 6–24)
Bilirubin Total: 0.3 mg/dL (ref 0.0–1.2)
CO2: 24 mmol/L (ref 20–29)
Calcium: 9.2 mg/dL (ref 8.7–10.2)
Chloride: 102 mmol/L (ref 96–106)
Creatinine, Ser: 0.59 mg/dL (ref 0.57–1.00)
Globulin, Total: 2 g/dL (ref 1.5–4.5)
Glucose: 66 mg/dL — ABNORMAL LOW (ref 70–99)
Potassium: 3.8 mmol/L (ref 3.5–5.2)
Sodium: 139 mmol/L (ref 134–144)
Total Protein: 6 g/dL (ref 6.0–8.5)
eGFR: 106 mL/min/{1.73_m2} (ref >59)

## 2021-06-25 LAB — MAGNESIUM: Magnesium: 2.1 mg/dL (ref 1.6–2.3)

## 2021-06-25 LAB — TSH: TSH: 1.51 u[IU]/mL (ref 0.450–4.500)

## 2021-06-25 LAB — HEMOGLOBIN A1C
Est. average glucose Bld gHb Est-mCnc: 105 mg/dL
Hgb A1c MFr Bld: 5.3 % (ref 4.8–5.6)

## 2021-06-25 LAB — CK: Total CK: 260 U/L — ABNORMAL HIGH (ref 32–182)

## 2021-06-28 ENCOUNTER — Ambulatory Visit (INDEPENDENT_AMBULATORY_CARE_PROVIDER_SITE_OTHER): Payer: Medicare Other

## 2021-06-28 ENCOUNTER — Telehealth: Payer: Medicare Other

## 2021-06-28 DIAGNOSIS — Q059 Spina bifida, unspecified: Secondary | ICD-10-CM

## 2021-06-28 DIAGNOSIS — I10 Essential (primary) hypertension: Secondary | ICD-10-CM

## 2021-06-28 DIAGNOSIS — R252 Cramp and spasm: Secondary | ICD-10-CM

## 2021-06-28 DIAGNOSIS — K219 Gastro-esophageal reflux disease without esophagitis: Secondary | ICD-10-CM

## 2021-06-28 DIAGNOSIS — M1712 Unilateral primary osteoarthritis, left knee: Secondary | ICD-10-CM

## 2021-06-28 DIAGNOSIS — Q6589 Other specified congenital deformities of hip: Secondary | ICD-10-CM

## 2021-06-28 NOTE — Patient Instructions (Addendum)
Visit Information   Thank you for taking time to visit with me today. Please don't hesitate to contact me if I can be of assistance to you before our next scheduled telephone appointment.  Following are the goals we discussed today:  (Copy and paste patient goals from clinical care plan here)  Our next appointment is by telephone on 09/03/21 at 10:20 AM  Please call the care guide team at [phone] if you need to cancel or reschedule your appointment.   If you are experiencing a Mental Health or [place] or need someone to talk to, please call 1-800-273-TALK (toll free, 24 hour hotline)   Following is a copy of your full care plan:   Care Plan : [place] of Care  Updates made by Lynne Logan, RN since 06/28/2021 12:00 AM     Problem: No plan established for management of chronic disease states (Spina bifida without hydrocephalus, unspecified, GERD, Osteoarthritis of left knee, Congenital dysplasia or right hip, HTN, Muscle cramps)   Priority: High     Long-Range Goal: Development of plan of care for management of chronic states (Spina bifida without hydrocephalus, unspecified, GERD, Osteoarthritis of left knee, Congenital dysplasia or right hip, HTN, Muscle cramps)   Start Date: 06/28/2021  Expected End Date: 06/28/2022  This Visit's Progress: On track  Priority: High  Note:   Current Barriers:  Knowledge Deficits related to plan of care for management of Spina bifida without hydrocephalus, unspecified, GERD, Osteoarthritis of left knee, Congenital dysplasia or right hip, HTN, Muscle cramps  Chronic Disease Management support and education needs related to Spina bifida without hydrocephalus, unspecified, GERD, Osteoarthritis of left knee, Congenital dysplasia or right hip, HTN, Muscle cramps  Impaired Physical Mobility   RNCM Clinical Goal(s):  Patient will verbalize basic understanding of  Spina bifida without hydrocephalus, unspecified, GERD,  Osteoarthritis of left knee, Congenital dysplasia or right hip, HTN, Muscle cramps disease process and self health management plan as evidenced by patient will report having no disease exacerbations related to her prescribed treatment plan  take all medications exactly as prescribed and will call provider for medication related questions as evidenced by patient will report having no missed doses of her prescribed medications  demonstrate Ongoing health management independence as evidenced by patient will report 100 % adherence to her prescribed treatment plan  continue to work with RN Care Manager to address care management and care coordination needs related to  Spina bifida without hydrocephalus, unspecified, GERD, Osteoarthritis of left knee, Congenital dysplasia or right hip, HTN, Muscle cramps as evidenced by adherence to CM Team Scheduled appointments through collaboration with RN Care manager, provider, and care team.   Interventions: 1:1 collaboration with primary care provider regarding development and update of comprehensive plan of care as evidenced by provider attestation and co-signature Inter-disciplinary care team collaboration (see longitudinal plan of care) Evaluation of current treatment plan related to  self management and patient's adherence to plan as established by provider  Muscle Cramps:  (Status:  New goal.)  Long Term Goal Evaluation of current treatment plan related to  Muscle cramps , self-management and patient's adherence to plan as established by provider Provided education to patient re: potential cause for muscle cramps, educated on the importance to stay well hydrated, discussed PCP recommendations for water intake of 48 oz daily  Educated on the benefits of exercise and or PT to help learn safe and effective stretches to help treat and reduce muscle cramps Collaborated with  PCP regarding medication recommendations for treatment of muscle cramps Reviewed medications  with patient and discussed per PCP, patient should switch Magnesium Oxide to Magnesium Glycinate 250 mg qd for better absorption of Magnesium to help with muscle cramps Mailed printed educational materials related to Magnesium; Muscle Cramps  Discussed plans with patient for ongoing care management follow up and provided patient with direct contact information for care management team  Hypertension Interventions:  (Status:  Goal on track:  Yes.) Long Term Goal Last practice recorded BP readings:  BP Readings from Last 3 Encounters:  06/24/21 128/64  12/23/20 118/62  08/10/20 (!) 112/50  Most recent eGFR/CrCl:  Lab Results  Component Value Date   EGFR 106 06/24/2021    No components found for: CRCL  Evaluation of current treatment plan related to hypertension self management and patient's adherence to plan as established by provider Reviewed medications with patient and discussed importance of compliance Advised patient, providing education and rationale, to monitor blood pressure daily and record, calling PCP for findings outside established parameters Provided education on prescribed diet low Sodium Assessed social determinant of health barriers  Patient Goals/Self-Care Activities: Take all medications as prescribed Attend all scheduled provider appointments Call pharmacy for medication refills 3-7 days in advance of running out of medications Call provider office for new concerns or questions  keep a blood pressure log take blood pressure log to all doctor appointments call doctor for signs and symptoms of high blood pressure take medications for blood pressure exactly as prescribed report new symptoms to your doctor  Follow Up Plan:  Telephone follow up appointment with care management team member scheduled for:  09/03/21      Consent to CCM Services: Mary Neal was given information about Chronic Care Management services including:  CCM service includes personalized  support from designated clinical staff supervised by her physician, including individualized plan of care and coordination with other care providers 24/7 contact phone numbers for assistance for urgent and routine care needs. Service will only be billed when office clinical staff spend 20 minutes or more in a month to coordinate care. Only one practitioner may furnish and bill the service in a calendar month. The patient may stop CCM services at any time (effective at the end of the month) by phone call to the office staff. The patient will be responsible for cost sharing (co-pay) of up to 20% of the service fee (after annual deductible is met).  Patient agreed to services and verbal consent obtained.   Patient verbalizes understanding of instructions provided today and agrees to view in [place].   Telephone follow up appointment with care management team member scheduled for: 09/03/21

## 2021-06-28 NOTE — Chronic Care Management (AMB) (Signed)
Chronic Care Management   CCM RN Visit Note  06/28/2021 Name: Mary Neal MRN: [id number] DOB: August 11, 1965  Subjective: BRIANCA Neal is a 55 y.o. year old female who is a primary care patient of Glendale Chard, MD. The care management team was consulted for assistance with disease management and care coordination needs.    Engaged with patient by telephone for follow up visit in response to provider referral for case management and/or care coordination services.   Consent to Services:  The patient was given information about Chronic Care Management services, agreed to services, and gave verbal consent prior to initiation of services.  Please see initial visit note for detailed documentation.   Patient agreed to services and verbal consent obtained.   Assessment: Review of patient past medical history, allergies, medications, health status, including review of consultants reports, laboratory and other test data, was performed as part of comprehensive evaluation and provision of chronic care management services.   SDOH (Social Determinants of Health) assessments and interventions performed:    CCM Care Plan  Allergies  Allergen Reactions   Clindamycin/Lincomycin Palpitations   Latex Other (See Comments)   Penicillins Other (See Comments)    Outpatient Encounter Medications as of 06/28/2021  Medication Sig   Digestive Enzymes (SUPER PAPAYA PLUS PO) Take 1 capsule by mouth daily at 2 am.   famotidine (PEPCID) 20 MG tablet TAKE 1 TABLET BY MOUTH EVERY DAY   Magnesium Oxide -Mg Supplement ([employer] MAGNESIUM OXIDE) 250 MG TABS TAKE 1 TABLET (250 MG TOTAL) BY MOUTH EVERY EVENING. WITH EVENING MEAL   oxybutynin (DITROPAN XL) 15 MG 24 hr tablet Take 1 tablet (15 mg total) by mouth 2 (two) times daily. Patient takes 1 tab 2 times per day   UNABLE TO FIND 1 capsule daily at 2 am. Med Name: enzyme plus   valsartan (DIOVAN) 160 MG tablet Take 1 tablet (160 mg total) by mouth daily.    vitamin C (ASCORBIC ACID) 500 MG tablet Take by mouth.   No facility-administered encounter medications on file as of 06/28/2021.    Patient Active Problem List   Diagnosis Date Noted   Visit for routine gyn exam 08/10/2020   Pelvic relaxation 08/10/2020   Essential hypertension 01/28/2020   Gastroesophageal reflux disease without esophagitis 07/12/2019   Nonrheumatic mitral valve regurgitation 05/08/2019   Atypical chest pain 04/17/2019   Neurogenic bowel 12/13/2018   Neurogenic bladder 12/07/2018   Peripheral motor neuropathy 12/07/2018   Osteoarthritis of right hip joint due to dysplasia 11/13/2018   Chronic pain of right knee 06/06/2018   Congenital dysplasia of right hip 06/06/2018   Osteoarthritis of left knee 06/17/2013   Spina bifida without hydrocephalus ([place]) 03/25/2013   Injury of ligament of left knee 03/25/2013   Paraparesis ([place]) 03/25/2013   Tear of medial collateral ligament of knee (left) 03/25/2013   Fibroids 02/14/2012    Conditions to be addressed/monitored:Spina bifida without hydrocephalus, unspecified, GERD, Osteoarthritis of left knee, Congenital dysplasia or right hip, HTN, Muscle cramps  Care Plan : RN Care Manager Plan of Care  Updates made by Lynne Logan, RN since 06/28/2021 12:00 AM     Problem: No plan established for management of chronic disease states (Spina bifida without hydrocephalus, unspecified, GERD, Osteoarthritis of left knee, Congenital dysplasia or right hip, HTN, Muscle cramps)   Priority: High     Long-Range Goal: Development of plan of care for management of chronic states (Spina bifida without hydrocephalus, unspecified, GERD, Osteoarthritis of left  knee, Congenital dysplasia or right hip, HTN, Muscle cramps)   Start Date: 06/28/2021  Expected End Date: 06/28/2022  This Visit's Progress: On track  Priority: High  Note:   Current Barriers:  Knowledge Deficits related to plan of care for management of Spina bifida without  hydrocephalus, unspecified, GERD, Osteoarthritis of left knee, Congenital dysplasia or right hip, HTN, Muscle cramps  Chronic Disease Management support and education needs related to Spina bifida without hydrocephalus, unspecified, GERD, Osteoarthritis of left knee, Congenital dysplasia or right hip, HTN, Muscle cramps  Impaired Physical Mobility   RNCM Clinical Goal(s):  Patient will verbalize basic understanding of  Spina bifida without hydrocephalus, unspecified, GERD, Osteoarthritis of left knee, Congenital dysplasia or right hip, HTN, Muscle cramps disease process and self health management plan as evidenced by patient will report having no disease exacerbations related to her prescribed treatment plan  take all medications exactly as prescribed and will call provider for medication related questions as evidenced by patient will report having no missed doses of her prescribed medications  demonstrate Ongoing health management independence as evidenced by patient will report 100 % adherence to her prescribed treatment plan  continue to work with RN Care Manager to address care management and care coordination needs related to  Spina bifida without hydrocephalus, unspecified, GERD, Osteoarthritis of left knee, Congenital dysplasia or right hip, HTN, Muscle cramps as evidenced by adherence to CM Team Scheduled appointments through collaboration with RN Care manager, provider, and care team.   Interventions: 1:1 collaboration with primary care provider regarding development and update of comprehensive plan of care as evidenced by provider attestation and co-signature Inter-disciplinary care team collaboration (see longitudinal plan of care) Evaluation of current treatment plan related to  self management and patient's adherence to plan as established by provider  Muscle Cramps:  (Status:  New goal.)  Long Term Goal Evaluation of current treatment plan related to  Muscle cramps , self-management and  patient's adherence to plan as established by provider Provided education to patient re: potential cause for muscle cramps, educated on the importance to stay well hydrated, discussed PCP recommendations for water intake of 48 oz daily  Educated on the benefits of exercise and or PT to help learn safe and effective stretches to help treat and reduce muscle cramps Collaborated with PCP regarding medication recommendations for treatment of muscle cramps Reviewed medications with patient and discussed per PCP, patient should switch Magnesium Oxide to Magnesium Glycinate 250 mg qd for better absorption of Magnesium to help with muscle cramps Mailed printed educational materials related to Magnesium; Muscle Cramps  Discussed plans with patient for ongoing care management follow up and provided patient with direct contact information for care management team  Hypertension Interventions:  (Status:  Goal on track:  Yes.) Long Term Goal Last practice recorded BP readings:  BP Readings from Last 3 Encounters:  06/24/21 128/64  12/23/20 118/62  08/10/20 (!) 112/50  Most recent eGFR/CrCl:  Lab Results  Component Value Date   EGFR 106 06/24/2021    No components found for: CRCL  Evaluation of current treatment plan related to hypertension self management and patient's adherence to plan as established by provider Reviewed medications with patient and discussed importance of compliance Advised patient, providing education and rationale, to monitor blood pressure daily and record, calling PCP for findings outside established parameters Provided education on prescribed diet low Sodium Assessed social determinant of health barriers  Patient Goals/Self-Care Activities: Take all medications as prescribed Attend all scheduled provider  appointments Call pharmacy for medication refills 3-7 days in advance of running out of medications Call provider office for new concerns or questions  keep a blood pressure  log take blood pressure log to all doctor appointments call doctor for signs and symptoms of high blood pressure take medications for blood pressure exactly as prescribed report new symptoms to your doctor  Follow Up Plan:  Telephone follow up appointment with care management team member scheduled for:  09/03/21     Plan:Telephone follow up appointment with care management team member scheduled for:  09/03/21  Barb Merino, RN, BSN, CCM Care Management Coordinator [place] [facility]  Direct Phone: [phone]

## 2021-06-30 ENCOUNTER — Ambulatory Visit: Payer: Self-pay

## 2021-06-30 ENCOUNTER — Telehealth: Payer: Self-pay

## 2021-06-30 ENCOUNTER — Telehealth: Payer: Medicare Other

## 2021-06-30 DIAGNOSIS — K219 Gastro-esophageal reflux disease without esophagitis: Secondary | ICD-10-CM

## 2021-06-30 DIAGNOSIS — M1712 Unilateral primary osteoarthritis, left knee: Secondary | ICD-10-CM

## 2021-06-30 DIAGNOSIS — Q059 Spina bifida, unspecified: Secondary | ICD-10-CM

## 2021-06-30 DIAGNOSIS — I10 Essential (primary) hypertension: Secondary | ICD-10-CM

## 2021-06-30 DIAGNOSIS — R252 Cramp and spasm: Secondary | ICD-10-CM

## 2021-06-30 DIAGNOSIS — Q6589 Other specified congenital deformities of hip: Secondary | ICD-10-CM

## 2021-06-30 NOTE — Telephone Encounter (Signed)
The patient's mom was notified that it's okay for the pt to take 200 mg of magnesium per day.

## 2021-07-01 NOTE — Chronic Care Management (AMB) (Signed)
Chronic Care Management   CCM RN Visit Note  06/30/2021 Name: Mary Neal MRN: [id number] DOB: [birth year]  Subjective: Mary Neal is a 55 y.o. year old female who is a primary care patient of Glendale Chard, MD. The care management team was consulted for assistance with disease management and care coordination needs.    Engaged with patient by telephone for follow up visit in response to provider referral for case management and/or care coordination services.   Consent to Services:  The patient was given information about Chronic Care Management services, agreed to services, and gave verbal consent prior to initiation of services.  Please see initial visit note for detailed documentation.   Patient agreed to services and verbal consent obtained.   Assessment: Review of patient past medical history, allergies, medications, health status, including review of consultants reports, laboratory and other test data, was performed as part of comprehensive evaluation and provision of chronic care management services.   SDOH (Social Determinants of Health) assessments and interventions performed:    CCM Care Plan  Allergies  Allergen Reactions   Clindamycin/Lincomycin Palpitations   Latex Other (See Comments)   Penicillins Other (See Comments)    Outpatient Encounter Medications as of 06/30/2021  Medication Sig   Digestive Enzymes (SUPER PAPAYA PLUS PO) Take 1 capsule by mouth daily at 2 am.   famotidine (PEPCID) 20 MG tablet TAKE 1 TABLET BY MOUTH EVERY DAY   Magnesium 300 MG CAPS Take by mouth.   oxybutynin (DITROPAN XL) 15 MG 24 hr tablet Take 1 tablet (15 mg total) by mouth 2 (two) times daily. Patient takes 1 tab 2 times per day   UNABLE TO FIND 1 capsule daily at 2 am. Med Name: enzyme plus   valsartan (DIOVAN) 160 MG tablet Take 1 tablet (160 mg total) by mouth daily.   vitamin C (ASCORBIC ACID) 500 MG tablet Take by mouth.   No facility-administered encounter  medications on file as of 06/30/2021.    Patient Active Problem List   Diagnosis Date Noted   Visit for routine gyn exam 08/10/2020   Pelvic relaxation 08/10/2020   Essential hypertension 01/28/2020   Gastroesophageal reflux disease without esophagitis 07/12/2019   Nonrheumatic mitral valve regurgitation 05/08/2019   Atypical chest pain 04/17/2019   Neurogenic bowel 12/13/2018   Neurogenic bladder 12/07/2018   Peripheral motor neuropathy 12/07/2018   Osteoarthritis of right hip joint due to dysplasia 11/13/2018   Chronic pain of right knee 06/06/2018   Congenital dysplasia of right hip 06/06/2018   Osteoarthritis of left knee 06/17/2013   Spina bifida without hydrocephalus ([place]) 03/25/2013   Injury of ligament of left knee 03/25/2013   Paraparesis ([place]) 03/25/2013   Tear of medial collateral ligament of knee (left) 03/25/2013   Fibroids 02/14/2012    Conditions to be addressed/monitored: Spina bifida without hydrocephalus, unspecified, GERD, Osteoarthritis of left knee, Congenital dysplasia or right hip, HTN, Muscle cramps  Care Plan : RN Care Manager Plan of Care  Updates made by Lynne Logan, RN since 06/30/2021 12:00 AM     Problem: No plan established for management of chronic disease states (Spina bifida without hydrocephalus, unspecified, GERD, Osteoarthritis of left knee, Congenital dysplasia or right hip, HTN, Muscle cramps)   Priority: High     Long-Range Goal: Development of plan of care for management of chronic states (Spina bifida without hydrocephalus, unspecified, GERD, Osteoarthritis of left knee, Congenital dysplasia or right hip, HTN, Muscle cramps)   Start Date: 06/28/2021  Expected End Date: 06/28/2022  Recent Progress: On track  Priority: High  Note:   Current Barriers:  Knowledge Deficits related to plan of care for management of Spina bifida without hydrocephalus, unspecified, GERD, Osteoarthritis of left knee, Congenital dysplasia or right hip,  HTN, Muscle cramps  Chronic Disease Management support and education needs related to Spina bifida without hydrocephalus, unspecified, GERD, Osteoarthritis of left knee, Congenital dysplasia or right hip, HTN, Muscle cramps  Impaired Physical Mobility   RNCM Clinical Goal(s):  Patient will verbalize basic understanding of  Spina bifida without hydrocephalus, unspecified, GERD, Osteoarthritis of left knee, Congenital dysplasia or right hip, HTN, Muscle cramps disease process and self health management plan as evidenced by patient will report having no disease exacerbations related to her prescribed treatment plan  take all medications exactly as prescribed and will call provider for medication related questions as evidenced by patient will report having no missed doses of her prescribed medications  demonstrate Ongoing health management independence as evidenced by patient will report 100 % adherence to her prescribed treatment plan  continue to work with RN Care Manager to address care management and care coordination needs related to  Spina bifida without hydrocephalus, unspecified, GERD, Osteoarthritis of left knee, Congenital dysplasia or right hip, HTN, Muscle cramps as evidenced by adherence to CM Team Scheduled appointments through collaboration with RN Care manager, provider, and care team.   Interventions: 1:1 collaboration with primary care provider regarding development and update of comprehensive plan of care as evidenced by provider attestation and co-signature Inter-disciplinary care team collaboration (see longitudinal plan of care) Evaluation of current treatment plan related to  self management and patient's adherence to plan as established by provider  Muscle Cramps:  (Status:  Goal on track. Yes)  Long Term Goal Evaluation of current treatment plan related to  Muscle cramps , self-management and patient's adherence to plan as established by provider Provided education to patient  re: potential cause for muscle cramps, educated on the importance to stay well hydrated, discussed PCP recommendations for water intake of 48 oz daily  Educated on the benefits of exercise and or PT to help learn safe and effective stretches to help treat and reduce muscle cramps Collaborated with PCP regarding medication recommendations for treatment of muscle cramps Reviewed medications with patient and discussed per PCP, patient should switch Magnesium Oxide to Magnesium Glycinate 250 mg qd for better absorption of Magnesium to help with muscle cramps Mailed printed educational materials related to Magnesium; Muscle Cramps  Discussed plans with patient for ongoing care management follow up and provided patient with direct contact information for care management team 06/30/21 completed inbound call with [name] Determined [name] would like recommendations from Dr. Baird Cancer regarding dosing for Magnesium Glycinate to help treat patient's muscle cramps Determined the lowest dose is 300 mg and this is greater than the dosage patient was taking at 250 mg Collaborated with Dr. Baird Cancer regarding mother's concerns and determined Dr. Baird Cancer approves the 300 mg daily dosage (the office staff notified Mary Neal of her approval) Discussed plans with patient for ongoing care management follow up and provided patient with direct contact information for care management team  Hypertension Interventions:  (Status: Condition stable. Not addressed.) Long Term Goal Last practice recorded BP readings:  BP Readings from Last 3 Encounters:  06/24/21 128/64  12/23/20 118/62  08/10/20 (!) 112/50  Most recent eGFR/CrCl:  Lab Results  Component Value Date   EGFR 106 06/24/2021    No  components found for: CRCL  Evaluation of current treatment plan related to hypertension self management and patient's adherence to plan as established by provider Reviewed medications with patient and  discussed importance of compliance Advised patient, providing education and rationale, to monitor blood pressure daily and record, calling PCP for findings outside established parameters Provided education on prescribed diet low Sodium Assessed social determinant of health barriers  Patient Goals/Self-Care Activities: Take all medications as prescribed Attend all scheduled provider appointments Call pharmacy for medication refills 3-7 days in advance of running out of medications Call provider office for new concerns or questions  keep a blood pressure log take blood pressure log to all doctor appointments call doctor for signs and symptoms of high blood pressure take medications for blood pressure exactly as prescribed report new symptoms to your doctor  Follow Up Plan:  Telephone follow up appointment with care management team member scheduled for:  09/03/21     Plan:Telephone follow up appointment with care management team member scheduled for:  09/03/21  Barb Merino, RN, BSN, CCM Care Management Coordinator [place] [facility]  Direct Phone: [phone]

## 2021-07-01 NOTE — Patient Instructions (Signed)
Visit Information  Thank you for taking time to visit with me today. Please don't hesitate to contact me if I can be of assistance to you before our next scheduled telephone appointment.  Following are the goals we discussed today:  (Copy and paste patient goals from clinical care plan here)  Our next appointment is by telephone on 09/03/21 at 10:20 AM  Please call the care guide team at [phone] if you need to cancel or reschedule your appointment.   If you are experiencing a Mental Health or [place] or need someone to talk to, please call 1-800-273-TALK (toll free, 24 hour hotline)   Patient verbalizes understanding of instructions provided today and agrees to view in [place].   Barb Merino, RN, BSN, CCM Care Management Coordinator [place] [facility]  Direct Phone: [phone]

## 2021-07-07 ENCOUNTER — Other Ambulatory Visit: Payer: Self-pay

## 2021-07-07 ENCOUNTER — Ambulatory Visit (INDEPENDENT_AMBULATORY_CARE_PROVIDER_SITE_OTHER): Payer: Medicare Other

## 2021-07-07 ENCOUNTER — Encounter: Payer: Self-pay | Admitting: Nurse Practitioner

## 2021-07-07 ENCOUNTER — Ambulatory Visit (INDEPENDENT_AMBULATORY_CARE_PROVIDER_SITE_OTHER): Payer: Medicare Other | Admitting: Nurse Practitioner

## 2021-07-07 VITALS — BP 102/70 | HR 63 | Temp 98.1°F | Ht <= 58 in | Wt 95.0 lb

## 2021-07-07 DIAGNOSIS — Z9989 Dependence on other enabling machines and devices: Secondary | ICD-10-CM

## 2021-07-07 DIAGNOSIS — Z0000 Encounter for general adult medical examination without abnormal findings: Secondary | ICD-10-CM

## 2021-07-07 DIAGNOSIS — M545 Low back pain, unspecified: Secondary | ICD-10-CM

## 2021-07-07 DIAGNOSIS — Z23 Encounter for immunization: Secondary | ICD-10-CM

## 2021-07-07 MED ORDER — MELOXICAM 7.5 MG PO TABS: ORAL_TABLET | ORAL | 1 refills | Status: AC

## 2021-07-07 NOTE — Progress Notes (Signed)
This visit occurred during the SARS-CoV-2 public health emergency.  Safety protocols were in place, including screening questions prior to the visit, additional usage of staff PPE, and extensive cleaning of exam room while observing appropriate contact time as indicated for disinfecting solutions.  Subjective:   Mary Neal is a 55 y.o. female who presents for Medicare Annual (Subsequent) preventive examination.  Review of Systems     Cardiac Risk Factors include: hypertension     Objective:    Today's Vitals   07/07/21 1114 07/07/21 1115  BP: 102/70   Pulse: 63   Temp: 98.1 F (36.7 C)   TempSrc: Oral   Weight: 95 lb (43.1 kg)   Height: 4\' 9"  (1.448 m)   PainSc:  6    Body mass index is 20.56 kg/m.  Advanced Directives 07/07/2021 07/01/2020 06/27/2019 01/11/2016 10/14/2015 08/07/2015 06/12/2015  Does Patient Have a Medical Advance Directive? Yes No Yes No No No No  Type of Paramedic of [place];Living will - [place];Living will - - - -  Copy of [place] in Chart? No - copy requested - No - copy requested - - - -  Would patient like information on creating a medical advance directive? - - - Yes - Educational materials given Yes - Scientist, clinical (histocompatibility and immunogenetics) given Yes - Scientist, clinical (histocompatibility and immunogenetics) given -    Current Medications (verified) Outpatient Encounter Medications as of 07/07/2021  Medication Sig   Digestive Enzymes (SUPER PAPAYA PLUS PO) Take 1 capsule by mouth daily at 2 am.   famotidine (PEPCID) 20 MG tablet TAKE 1 TABLET BY MOUTH EVERY DAY   Magnesium 300 MG CAPS Take by mouth.   meloxicam (MOBIC) 7.5 MG tablet Take 1 tablet by mouth x 5 days then one tab daily as needed   oxybutynin (DITROPAN XL) 15 MG 24 hr tablet Take 1 tablet (15 mg total) by mouth 2 (two) times daily. Patient takes 1 tab 2 times per day   UNABLE TO FIND 1 capsule daily at 2 am. Med Name: enzyme plus   valsartan (DIOVAN) 160 MG tablet  Take 1 tablet (160 mg total) by mouth daily.   vitamin C (ASCORBIC ACID) 500 MG tablet Take by mouth.   No facility-administered encounter medications on file as of 07/07/2021.    Allergies (verified) Clindamycin/lincomycin, Latex, and Penicillins   History: Past Medical History:  Diagnosis Date   Abnormal Pap smear 2013   Dislocation of right hip ([place]) 06/09/2018   Per MRI   History of knee replacement 2005   Spina bifida [facility])    Uterine prolapse    Wears glasses    Past Surgical History:  Procedure Laterality Date   TOTAL HIP ARTHROPLASTY Right 12/2018   Family History  Problem Relation Age of Onset   Hypertension Mother    Hyperlipidemia Mother    Heart attack Brother    Social History   Socioeconomic History   Marital status: Single    Spouse name: Not on file   Number of children: 0   Years of education: Not on file   Highest education level: Not on file  Occupational History   Occupation: unemployed  Tobacco Use   Smoking status: Never   Smokeless tobacco: Never  Vaping Use   Vaping Use: Never used  Substance and Sexual Activity   Alcohol use: No    Alcohol/week: 0.0 standard drinks   Drug use: No   Sexual activity: Never    Birth  control/protection: None  Other Topics Concern   Not on file  Social History Narrative   ** Merged History Encounter **       Social Determinants of Health   Financial Resource Strain: Low Risk    Difficulty of Paying Living Expenses: Not hard at all  Food Insecurity: No Food Insecurity   Worried About Charity fundraiser in the Last Year: Never true   Arboriculturist in the Last Year: Never true  Transportation Needs: Public librarian (Medical): No   Lack of Transportation (Non-Medical): Yes  Physical Activity: Insufficiently Active   Days of Exercise per Week: 2 days   Minutes of Exercise per Session: 40 min  Stress: No Stress Concern Present   Feeling of Stress : Not at all   Social Connections: Not on file    Tobacco Counseling Counseling given: Not Answered   Clinical Intake:  Pre-visit preparation completed: Yes  Pain : 0-10 Pain Score: 6  Pain Type: Acute pain Pain Location: Back Pain Orientation: Mid, Upper Pain Onset: In the past 7 days Pain Frequency: Intermittent     Nutritional Status: BMI of 19-24  Normal Nutritional Risks: None Diabetes: No  How often do you need to have someone help you when you read instructions, pamphlets, or other written materials from your doctor or pharmacy?: 1 - Never What is the last grade level you completed in school?: 12th grade  Diabetic? no  Interpreter Needed?: No  Information entered by :: NAllen LPN   Activities of Daily Living In your present state of health, do you have any difficulty performing the following activities: 07/07/2021  Hearing? N  Vision? N  Difficulty concentrating or making decisions? N  Walking or climbing stairs? Y  Dressing or bathing? Y  Doing errands, shopping? Y  Preparing Food and eating ? N  Using the Toilet? N  In the past six months, have you accidently leaked urine? Y  Do you have problems with loss of bowel control? Y  Managing your Medications? N  Managing your Finances? Y  Housekeeping or managing your Housekeeping? Y  Some recent data might be hidden    Patient Care Team: Glendale Chard, MD as PCP - General (Internal Medicine) Rex Kras, Claudette Stapler, RN as Case Manager Daneen Schick as [place] any recent Medical Services you may have received from other than [facility] providers in the past year (date may be approximate).     Assessment:   This is a routine wellness examination for [name].  Hearing/Vision screen Vision Screening - Comments:: Regular eye exams, Dr. Katy Fitch  Dietary issues and exercise activities discussed: Current Exercise Habits: The patient does not participate in regular exercise at present    Goals Addressed             This Visit's Progress    Patient Stated       07/07/2021, no goals       Depression Screen PHQ 2/9 Scores 07/07/2021 08/12/2020 07/01/2020 01/06/2020 06/27/2019 04/15/2019 01/08/2019  PHQ - 2 Score 0 0 0 0 0 0 0  PHQ- 9 Score - 0 - - 0 - -    Fall Risk Fall Risk  07/07/2021 07/01/2020 01/06/2020 06/27/2019 04/15/2019  Falls in the past year? 0 1 0 1 1  Comment - slipped on the floor - - -  Number falls in past yr: - 0 0 1 1  Injury with Fall? -  0 0 0 1  Risk for fall due to : Impaired balance/gait;Impaired mobility;Medication side effect History of fall(s);Impaired balance/gait;Impaired mobility;Medication side effect - History of fall(s);Medication side effect -  Risk for fall due to: Comment - - - - -  Follow up Falls evaluation completed;Education provided;Falls prevention discussed Falls evaluation completed;Education provided;Falls prevention discussed - Falls evaluation completed;Education provided;Falls prevention discussed -    FALL RISK PREVENTION PERTAINING TO THE HOME:  Any stairs in or around the home? No  If so, are there any without handrails? N/a Home free of loose throw rugs in walkways, pet beds, electrical cords, etc? Yes  Adequate lighting in your home to reduce risk of falls? Yes   ASSISTIVE DEVICES UTILIZED TO PREVENT FALLS:  Life alert? No  Use of a cane, walker or w/c? Yes  Grab bars in the bathroom? Yes  Shower chair or bench in shower? Yes  Elevated toilet seat or a handicapped toilet? Yes   TIMED UP AND GO:  Was the test performed? No .    Gait slow and steady with assistive device  Cognitive Function:     6CIT Screen 07/07/2021 07/01/2020 06/27/2019  What Year? 0 points 0 points 0 points  What month? 0 points 0 points 0 points  What time? 0 points 0 points 0 points  Count back from 20 0 points 4 points 0 points  Months in reverse 4 points 4 points 4 points  Repeat phrase 8 points 0 points 10 points  Total  Score 12 8 14     Immunizations Immunization History  Administered Date(s) Administered   Influenza,inj,Quad PF,6+ Mos 06/19/2018, 04/15/2019, 04/08/2020, 05/16/2021   PFIZER(Purple Top)SARS-COV-2 Vaccination 09/24/2019, 10/15/2019, 07/13/2020   PNEUMOCOCCAL CONJUGATE-20 07/07/2021   Tdap 05/07/2019   Zoster Recombinat (Shingrix) 12/24/2020, 03/04/2021    TDAP status: Up to date  Flu Vaccine status: Up to date  Pneumococcal vaccine status: Completed during today's visit.  Covid-19 vaccine status: Completed vaccines  Qualifies for Shingles Vaccine? Yes   Zostavax completed No   Shingrix Completed?: Yes, needs second dose  Screening Tests Health Maintenance  Topic Date Due   COVID-19 Vaccine (4 - Booster for Pfizer series) 09/07/2020   HIV Screening  07/11/2021 (Originally 02/02/1981)   MAMMOGRAM  07/08/2021   PAP SMEAR-Modifier  08/11/2023   COLONOSCOPY (Pts 45-40yrs Insurance coverage will need to be confirmed)  05/19/2025   TETANUS/TDAP  05/06/2029   Pneumococcal Vaccine 46-31 Years old  Completed   INFLUENZA VACCINE  Completed   Hepatitis C Screening  Completed   Zoster Vaccines- Shingrix  Completed   HPV VACCINES  Aged Out    Health Maintenance  Health Maintenance Due  Topic Date Due   COVID-19 Vaccine (4 - Booster for [place] series) 09/07/2020    Colorectal cancer screening: Type of screening: Colonoscopy. Completed 05/20/2015. Repeat every 10 years  Mammogram status: Completed 07/08/2020. Repeat every year  Bone Density status: n/a  Lung Cancer Screening: (Low Dose CT Chest recommended if Age 98-80 years, 30 pack-year currently smoking OR have quit w/in 15years.) does not qualify.   Lung Cancer Screening Referral: no  Additional Screening:  Hepatitis C Screening: does qualify; Completed 01/07/2020  Vision Screening: Recommended annual ophthalmology exams for early detection of glaucoma and other disorders of the eye. Is the patient up to date with their  annual eye exam?  Yes  Who is the provider or what is the name of the office in which the patient attends annual eye exams? Dr. Katy Fitch  If pt is not established with a provider, would they like to be referred to a provider to establish care? No .   Dental Screening: Recommended annual dental exams for proper oral hygiene  Community Resource Referral / Chronic Care Management: CRR required this visit?  No   CCM required this visit?  No      Plan:     I have personally reviewed and noted the following in the patients chart:   Medical and social history Use of alcohol, tobacco or illicit drugs  Current medications and supplements including opioid prescriptions.  Functional ability and status Nutritional status Physical activity Advanced directives List of other physicians Hospitalizations, surgeries, and ER visits in previous 12 months Vitals Screenings to include cognitive, depression, and falls Referrals and appointments  In addition, I have reviewed and discussed with patient certain preventive protocols, quality metrics, and best practice recommendations. A written personalized care plan for preventive services as well as general preventive health recommendations were provided to patient.     Kellie Simmering, LPN   [id number]   Nurse Notes: none

## 2021-07-07 NOTE — Patient Instructions (Signed)
[name] , Thank you for taking time to come for your Medicare Wellness Visit. I appreciate your ongoing commitment to your health goals. Please review the following plan we discussed and let me know if I can assist you in the future.   Screening recommendations/referrals: Colonoscopy: completed 05/20/2015 Mammogram: completed 07/08/2020 Bone Density: n/a Recommended yearly ophthalmology/optometry visit for glaucoma screening and checkup Recommended yearly dental visit for hygiene and checkup  Vaccinations: Influenza vaccine: completed 05/16/2021 Pneumococcal vaccine: today Tdap vaccine: completed 05/07/2019, due 05/06/2029 Shingles vaccine: discussed  Covid-19:  07/13/2020, 10/15/2019, 09/24/2019  Advanced directives: Please bring a copy of your POA (Power of Attorney) and/or Living Will to your next appointment.   Conditions/risks identified: none  Next appointment: Follow up in one year for your annual wellness visit.   Preventive Care 40-64 Years, Female Preventive care refers to lifestyle choices and visits with your health care provider that can promote health and wellness. What does preventive care include? A yearly physical exam. This is also called an annual well check. Dental exams once or twice a year. Routine eye exams. Ask your health care provider how often you should have your eyes checked. Personal lifestyle choices, including: Daily care of your teeth and gums. Regular physical activity. Eating a healthy diet. Avoiding tobacco and drug use. Limiting alcohol use. Practicing safe sex. Taking low-dose aspirin daily starting at age 57. Taking vitamin and mineral supplements as recommended by your health care provider. What happens during an annual well check? The services and screenings done by your health care provider during your annual well check will depend on your age, overall health, lifestyle risk factors, and family history of disease. Counseling  Your  health care provider may ask you questions about your: Alcohol use. Tobacco use. Drug use. Emotional well-being. Home and relationship well-being. Sexual activity. Eating habits. Work and work Statistician. Method of birth control. Menstrual cycle. Pregnancy history. Screening  You may have the following tests or measurements: Height, weight, and BMI. Blood pressure. Lipid and cholesterol levels. These may be checked every 5 years, or more frequently if you are over 36 years old. Skin check. Lung cancer screening. You may have this screening every year starting at age 16 if you have a 30-pack-year history of smoking and currently smoke or have quit within the past 15 years. Fecal occult blood test (FOBT) of the stool. You may have this test every year starting at age 24. Flexible sigmoidoscopy or colonoscopy. You may have a sigmoidoscopy every 5 years or a colonoscopy every 10 years starting at age 19. Hepatitis C blood test. Hepatitis B blood test. Sexually transmitted disease (STD) testing. Diabetes screening. This is done by checking your blood sugar (glucose) after you have not eaten for a while (fasting). You may have this done every 1-3 years. Mammogram. This may be done every 1-2 years. Talk to your health care provider about when you should start having regular mammograms. This may depend on whether you have a family history of breast cancer. BRCA-related cancer screening. This may be done if you have a family history of breast, ovarian, tubal, or peritoneal cancers. Pelvic exam and Pap test. This may be done every 3 years starting at age 41. Starting at age 85, this may be done every 5 years if you have a Pap test in combination with an HPV test. Bone density scan. This is done to screen for osteoporosis. You may have this scan if you are at high risk for osteoporosis. Discuss  your test results, treatment options, and if necessary, the need for more tests with your health care  provider. Vaccines  Your health care provider may recommend certain vaccines, such as: Influenza vaccine. This is recommended every year. Tetanus, diphtheria, and acellular pertussis (Tdap, Td) vaccine. You may need a Td booster every 10 years. Zoster vaccine. You may need this after age 40. Pneumococcal 13-valent conjugate (PCV13) vaccine. You may need this if you have certain conditions and were not previously vaccinated. Pneumococcal polysaccharide (PPSV23) vaccine. You may need one or two doses if you smoke cigarettes or if you have certain conditions. Talk to your health care provider about which screenings and vaccines you need and how often you need them. This information is not intended to replace advice given to you by your health care provider. Make sure you discuss any questions you have with your health care provider. Document Released: 07/24/2015 Document Revised: 03/16/2016 Document Reviewed: 04/28/2015 Elsevier Interactive Patient Education  2017 [place] Prevention in the Home Falls can cause injuries. They can happen to people of all ages. There are many things you can do to make your home safe and to help prevent falls. What can I do on the outside of my home? Regularly fix the edges of walkways and driveways and fix any cracks. Remove anything that might make you trip as you walk through a door, such as a raised step or threshold. Trim any bushes or trees on the path to your home. Use bright outdoor lighting. Clear any walking paths of anything that might make someone trip, such as rocks or tools. Regularly check to see if handrails are loose or broken. Make sure that both sides of any steps have handrails. Any raised decks and porches should have guardrails on the edges. Have any leaves, snow, or ice cleared regularly. Use sand or salt on walking paths during winter. Clean up any spills in your garage right away. This includes oil or grease spills. What  can I do in the bathroom? Use night lights. Install grab bars by the toilet and in the tub and shower. Do not use towel bars as grab bars. Use non-skid mats or decals in the tub or shower. If you need to sit down in the shower, use a plastic, non-slip stool. Keep the floor dry. Clean up any water that spills on the floor as soon as it happens. Remove soap buildup in the tub or shower regularly. Attach bath mats securely with double-sided non-slip rug tape. Do not have throw rugs and other things on the floor that can make you trip. What can I do in the bedroom? Use night lights. Make sure that you have a light by your bed that is easy to reach. Do not use any sheets or blankets that are too big for your bed. They should not hang down onto the floor. Have a firm chair that has side arms. You can use this for support while you get dressed. Do not have throw rugs and other things on the floor that can make you trip. What can I do in the kitchen? Clean up any spills right away. Avoid walking on wet floors. Keep items that you use a lot in easy-to-reach places. If you need to reach something above you, use a strong step stool that has a grab bar. Keep electrical cords out of the way. Do not use floor polish or wax that makes floors slippery. If you must use wax, use  non-skid floor wax. Do not have throw rugs and other things on the floor that can make you trip. What can I do with my stairs? Do not leave any items on the stairs. Make sure that there are handrails on both sides of the stairs and use them. Fix handrails that are broken or loose. Make sure that handrails are as long as the stairways. Check any carpeting to make sure that it is firmly attached to the stairs. Fix any carpet that is loose or worn. Avoid having throw rugs at the top or bottom of the stairs. If you do have throw rugs, attach them to the floor with carpet tape. Make sure that you have a light switch at the top of the  stairs and the bottom of the stairs. If you do not have them, ask someone to add them for you. What else can I do to help prevent falls? Wear shoes that: Do not have high heels. Have rubber bottoms. Are comfortable and fit you well. Are closed at the toe. Do not wear sandals. If you use a stepladder: Make sure that it is fully opened. Do not climb a closed stepladder. Make sure that both sides of the stepladder are locked into place. Ask someone to hold it for you, if possible. Clearly mark and make sure that you can see: Any grab bars or handrails. First and last steps. Where the edge of each step is. Use tools that help you move around (mobility aids) if they are needed. These include: Canes. Walkers. Scooters. Crutches. Turn on the lights when you go into a dark area. Replace any light bulbs as soon as they burn out. Set up your furniture so you have a clear path. Avoid moving your furniture around. If any of your floors are uneven, fix them. If there are any pets around you, be aware of where they are. Review your medicines with your doctor. Some medicines can make you feel dizzy. This can increase your chance of falling. Ask your doctor what other things that you can do to help prevent falls. This information is not intended to replace advice given to you by your health care provider. Make sure you discuss any questions you have with your health care provider. Document Released: 04/23/2009 Document Revised: 12/03/2015 Document Reviewed: 08/01/2014 Elsevier Interactive Patient Education  2017 [employer].

## 2021-07-07 NOTE — Progress Notes (Signed)
Rich Brave [place],acting as a Education administrator for Minette Brine, FNP.,have documented all relevant documentation on the behalf of Minette Brine, FNP,as directed by  Minette Brine, FNP while in the presence of Minette Brine, [place].  This visit occurred during the SARS-CoV-2 public health emergency.  Safety protocols were in place, including screening questions prior to the visit, additional usage of staff PPE, and extensive cleaning of exam room while observing appropriate contact time as indicated for disinfecting solutions.  Subjective:     Patient ID: Mary Neal , female    DOB: [birth year] , 55 y.o.   MRN: [id number]   Chief Complaint  Patient presents with   Back Pain    HPI  Patient presents today for back pain. She is here with her Mother. Her mother had a question regarding the magnesium she wanted to know if it is ok to take the 350mg . She seen Dr Baird Cancer on Wednesday and then the next day she was having pain where she could not move. Intermittent. She took tylenol without relief. Denies numbness or tingling. She had an AWV today as well with [facility]  Wt Readings from Last 3 Encounters: 07/07/21 : 95 lb (43.1 kg) 06/24/21 : 111 lb (50.3 kg) 12/23/20 : 105 lb (47.6 kg)  She feels like her appetite is good.      Back Pain This is a new problem. The current episode started 1 to 4 weeks ago. The problem occurs intermittently. The quality of the pain is described as stabbing. Radiates to: to her neck. The pain is at a severity of 6/10. The pain is moderate. The pain is The same all the time. Pertinent negatives include no numbness or tingling.    Past Medical History:  Diagnosis Date   Abnormal Pap smear 2013   Dislocation of right hip ([place]) 06/09/2018   Per MRI   History of knee replacement 2005   Spina bifida [facility])    Uterine prolapse    Wears glasses      Family History  Problem Relation Age of Onset   Hypertension Mother    Hyperlipidemia Mother    Heart attack Brother       Current Outpatient Medications:    Digestive Enzymes (SUPER PAPAYA PLUS PO), Take 1 capsule by mouth daily at 2 am., Disp: , Rfl:    famotidine (PEPCID) 20 MG tablet, TAKE 1 TABLET BY MOUTH EVERY DAY, Disp: 90 tablet, Rfl: 1   Magnesium 300 MG CAPS, Take by mouth., Disp: , Rfl:    meloxicam (MOBIC) 7.5 MG tablet, Take 1 tablet by mouth x 5 days then one tab daily as needed, Disp: 30 tablet, Rfl: 1   UNABLE TO FIND, 1 capsule daily at 2 am. Med Name: enzyme plus, Disp: , Rfl:    vitamin C (ASCORBIC ACID) 500 MG tablet, Take by mouth., Disp: , Rfl:    oxybutynin (DITROPAN XL) 15 MG 24 hr tablet, TAKE 1 TABLET BY MOUTH  TWICE DAILY, Disp: 180 tablet, Rfl: 3   valsartan (DIOVAN) 160 MG tablet, TAKE 1 TABLET BY MOUTH EVERY DAY, Disp: 90 tablet, Rfl: 1   Allergies  Allergen Reactions   Clindamycin/Lincomycin Palpitations   Latex Other (See Comments)   Penicillins Other (See Comments)     Review of Systems  Constitutional: Negative.   Respiratory: Negative.    Cardiovascular: Negative.   Musculoskeletal:  Positive for back pain.  Neurological:  Negative for dizziness, tingling and numbness.  Psychiatric/Behavioral: Negative.  Today's Vitals   07/07/21 1016  BP: 102/70  Pulse: 63  Temp: 98.1 F (36.7 C)  Weight: 95 lb (43.1 kg)  Height: 4\' 9"  (1.448 m)  PainSc: 6   PainLoc: Back   Body mass index is 20.56 kg/m.   Objective:  Physical Exam Vitals reviewed.  Constitutional:      General: She is not in acute distress.    Appearance: Normal appearance.  Cardiovascular:     Rate and Rhythm: Normal rate and regular rhythm.     Pulses: Normal pulses.     Heart sounds: Normal heart sounds. No murmur heard. Pulmonary:     Effort: Pulmonary effort is normal. No respiratory distress.     Breath sounds: Normal breath sounds. No wheezing.  Musculoskeletal:        General: Tenderness (lower back) present. No swelling.     Right lower leg: No edema.     Left lower leg: No  edema.     Comments: Using her walking canes/crutches  Skin:    Capillary Refill: Capillary refill takes less than 2 seconds.  Neurological:     General: No focal deficit present.     Mental Status: She is alert and oriented to person, place, and time.     Cranial Nerves: No cranial nerve deficit.     Motor: No weakness.  Psychiatric:        Mood and Affect: Mood normal.        Behavior: Behavior normal.        Thought Content: Thought content normal.        Judgment: Judgment normal.        Assessment And Plan:     1. Low back pain, unspecified back pain laterality, unspecified chronicity, unspecified whether sciatica present Comments: mild tenderness to palpation of lower back will try meloxicam as needed - meloxicam (MOBIC) 7.5 MG tablet; Take 1 tablet by mouth x 5 days then one tab daily as needed  Dispense: 30 tablet; Refill: 1     Patient was given opportunity to ask questions. Patient verbalized understanding of the plan and was able to repeat key elements of the plan. All questions were answered to their satisfaction.  Minette Brine, FNP   I, Minette Brine, FNP, have reviewed all documentation for this visit. The documentation on 07/07/2021 for the exam, diagnosis, procedures, and orders are all accurate and complete.   IF YOU HAVE BEEN REFERRED TO A SPECIALIST, IT MAY TAKE 1-2 WEEKS TO SCHEDULE/PROCESS THE REFERRAL. IF YOU HAVE NOT HEARD FROM US/SPECIALIST IN TWO WEEKS, PLEASE GIVE [place] A CALL AT [phone].   THE PATIENT IS ENCOURAGED TO PRACTICE SOCIAL DISTANCING DUE TO THE COVID-19 PANDEMIC.

## 2021-07-10 DIAGNOSIS — I10 Essential (primary) hypertension: Secondary | ICD-10-CM

## 2021-07-10 DIAGNOSIS — M1712 Unilateral primary osteoarthritis, left knee: Secondary | ICD-10-CM

## 2021-07-15 ENCOUNTER — Other Ambulatory Visit: Payer: Self-pay | Admitting: Internal Medicine

## 2021-07-21 ENCOUNTER — Ambulatory Visit (INDEPENDENT_AMBULATORY_CARE_PROVIDER_SITE_OTHER): Payer: Medicare Other

## 2021-07-21 DIAGNOSIS — Q6589 Other specified congenital deformities of hip: Secondary | ICD-10-CM

## 2021-07-21 DIAGNOSIS — M1712 Unilateral primary osteoarthritis, left knee: Secondary | ICD-10-CM

## 2021-07-21 DIAGNOSIS — Q059 Spina bifida, unspecified: Secondary | ICD-10-CM

## 2021-07-21 NOTE — Patient Instructions (Signed)
Social Worker Visit Information  Goals we discussed today:   Goals Addressed             This Visit's Progress    COMPLETED: Mobility and Independence Optimized       Timeframe:  Short-Term Goal Priority:  Medium Start Date:  7.5.22                                           Patient Goals/Self-Care Activities patient will:   - [place] or [facility] with transportation needs -Contact SW as needed          Materials Provided: No: Patient declined  Patient verbalizes understanding of instructions provided today and agrees to view in [place].   Follow Up Plan:  No SW follow up planned at this time. Please contact me as needed.   Daneen Schick, BSW, CDP Social Worker, Certified Dementia Practitioner [place] [facility] [place] [facility] [phone]

## 2021-07-21 NOTE — Chronic Care Management (AMB) (Signed)
Chronic Care Management    Social Work Note  07/21/2021 Name: Mary Neal MRN: [id number] DOB: 28-Oct-1965  Mary Neal is a 56 y.o. year old female who is a primary care patient of Glendale Chard, MD. The CCM team was consulted to assist the patient with chronic disease management and/or care coordination needs related to: Spina Bifida without Hydrocephalus, Osteoarthritis of the knee, congenital dysplasia of right hip.   Engaged with patients mother and caregiver Mary Neal by phone  for follow up visit in response to provider referral for social work chronic care management and care coordination services.   Consent to Services:  The patient was given information about Chronic Care Management services, agreed to services, and gave verbal consent prior to initiation of services.  Please see initial visit note for detailed documentation.   Patient agreed to services and consent obtained.   Assessment: Review of patient past medical history, allergies, medications, and health status, including review of relevant consultants reports was performed today as part of a comprehensive evaluation and provision of chronic care management and care coordination services.     SDOH (Social Determinants of Health) assessments and interventions performed:  SDOH Interventions    Flowsheet Row Most Recent Value  SDOH Interventions   Food Insecurity Interventions Intervention Not Indicated  Housing Interventions Intervention Not Indicated  Transportation Interventions Intervention Not Indicated        Advanced Directives Status: Not addressed in this encounter.  CCM Care Plan  Allergies  Allergen Reactions   Clindamycin/Lincomycin Palpitations   Latex Other (See Comments)   Penicillins Other (See Comments)    Outpatient Encounter Medications as of 07/21/2021  Medication Sig   Digestive Enzymes (SUPER PAPAYA PLUS PO) Take 1 capsule by mouth daily at 2 am.   famotidine  (PEPCID) 20 MG tablet TAKE 1 TABLET BY MOUTH EVERY DAY   Magnesium 300 MG CAPS Take by mouth.   meloxicam (MOBIC) 7.5 MG tablet Take 1 tablet by mouth x 5 days then one tab daily as needed   oxybutynin (DITROPAN XL) 15 MG 24 hr tablet Take 1 tablet (15 mg total) by mouth 2 (two) times daily. Patient takes 1 tab 2 times per day   UNABLE TO FIND 1 capsule daily at 2 am. Med Name: enzyme plus   valsartan (DIOVAN) 160 MG tablet TAKE 1 TABLET BY MOUTH EVERY DAY   vitamin C (ASCORBIC ACID) 500 MG tablet Take by mouth.   No facility-administered encounter medications on file as of 07/21/2021.    Patient Active Problem List   Diagnosis Date Noted   Visit for routine gyn exam 08/10/2020   Pelvic relaxation 08/10/2020   Essential hypertension 01/28/2020   Gastroesophageal reflux disease without esophagitis 07/12/2019   Nonrheumatic mitral valve regurgitation 05/08/2019   Atypical chest pain 04/17/2019   Neurogenic bowel 12/13/2018   Neurogenic bladder 12/07/2018   Peripheral motor neuropathy 12/07/2018   Osteoarthritis of right hip joint due to dysplasia 11/13/2018   Chronic pain of right knee 06/06/2018   Congenital dysplasia of right hip 06/06/2018   Osteoarthritis of left knee 06/17/2013   Spina bifida without hydrocephalus ([place]) 03/25/2013   Injury of ligament of left knee 03/25/2013   Paraparesis ([place]) 03/25/2013   Tear of medial collateral ligament of knee (left) 03/25/2013   Fibroids 02/14/2012    Conditions to be addressed/monitored: Spina Bifida without Hydrocephalus, Osteoarthritis of the knee, congenital dysplasia of right hip  Care Plan : Social Work [facility]Do[facility]  Updates made  by Daneen Schick since 07/21/2021 12:00 AM  Completed 07/21/2021   Problem: Mobility and Independence - Transportation Resolved 07/21/2021     Goal: Mobility and Independence Optimized Completed 07/21/2021  Start Date: 01/12/2021  Expected End Date: 02/11/2021  Recent Progress: On track  Priority: Medium  Note:    Current Barriers:  Chronic disease management support and education needs related to  HTN, Spina Bifida without Hydrocephalus, GERD, Osteoarthritis of Knee, Congenital Dysplasia of Right Hip   Transportation  Social Worker Clinical Goal(s):  patient will work with SW to identify and address any acute and/or chronic care coordination needs related to the self health management of HTN, Spina Bifida without Hydrocephalus, GERD, Osteoarthritis of Knee, Congenital Dysplasia of Right Hip patient will work with SW to address concerns related to SCAT approval  SW Interventions:  Inter-disciplinary care team collaboration (see longitudinal plan of care) Collaboration with Glendale Chard, MD regarding development and update of comprehensive plan of care as evidenced by provider attestation and co-signature Successful outbound call placed to the patients [name] to assess for care coordination needs SDo[facility] screening completed with no acute challenges noted at this time Performed chart review to note patient seen in clinic in December for complaints of lower back pain - Mrs. Po reports this pain has resolved and patient doing well Discussed plans for SW to sign off with option for patient to contact SW as needed  Patient Goals/Self-Care Activities patient will:   - [place] or [facility] with transportation needs -Contact SW as needed        Follow Up Plan:  No SW follow up planned at this time. The patient will remain engaged with RN Care Manager to address care coordination needs.      Daneen Schick, BSW, CDP Social Worker, Certified Dementia Practitioner [place] [facility] [place] [facility] [phone]

## 2021-07-26 DIAGNOSIS — R339 Retention of urine, unspecified: Secondary | ICD-10-CM | POA: Diagnosis not present

## 2021-07-28 DIAGNOSIS — Z1231 Encounter for screening mammogram for malignant neoplasm of breast: Secondary | ICD-10-CM | POA: Diagnosis not present

## 2021-07-28 LAB — HM MAMMOGRAPHY

## 2021-07-29 ENCOUNTER — Encounter: Payer: Self-pay | Admitting: Internal Medicine

## 2021-08-10 ENCOUNTER — Encounter: Payer: Self-pay | Admitting: Nurse Practitioner

## 2021-08-10 ENCOUNTER — Other Ambulatory Visit: Payer: Self-pay | Admitting: Internal Medicine

## 2021-08-10 DIAGNOSIS — M1712 Unilateral primary osteoarthritis, left knee: Secondary | ICD-10-CM

## 2021-08-13 ENCOUNTER — Other Ambulatory Visit: Payer: Self-pay | Admitting: Internal Medicine

## 2021-08-31 DIAGNOSIS — R339 Retention of urine, unspecified: Secondary | ICD-10-CM | POA: Diagnosis not present

## 2021-09-01 ENCOUNTER — Other Ambulatory Visit: Payer: Self-pay

## 2021-09-01 ENCOUNTER — Encounter: Payer: Self-pay | Admitting: Internal Medicine

## 2021-09-01 ENCOUNTER — Ambulatory Visit (INDEPENDENT_AMBULATORY_CARE_PROVIDER_SITE_OTHER): Payer: Medicare Other | Admitting: Internal Medicine

## 2021-09-01 VITALS — BP 136/74 | HR 82 | Temp 98.7°F | Ht <= 58 in | Wt 108.0 lb

## 2021-09-01 DIAGNOSIS — H00011 Hordeolum externum right upper eyelid: Secondary | ICD-10-CM | POA: Diagnosis not present

## 2021-09-01 DIAGNOSIS — M545 Low back pain, unspecified: Secondary | ICD-10-CM

## 2021-09-01 DIAGNOSIS — I10 Essential (primary) hypertension: Secondary | ICD-10-CM

## 2021-09-01 DIAGNOSIS — G8220 Paraplegia, unspecified: Secondary | ICD-10-CM

## 2021-09-01 MED ORDER — TOBRADEX 0.3-0.1 % OP OINT: 1.0000 "application " | TOPICAL_OINTMENT | Freq: Three times a day (TID) | OPHTHALMIC | 0 refills | Status: DC

## 2021-09-01 NOTE — Progress Notes (Signed)
Rich Brave Llittleton,acting as a Education administrator for Maximino Greenland, MD.,have documented all relevant documentation on the behalf of Maximino Greenland, MD,as directed by  Maximino Greenland, MD while in the presence of Maximino Greenland, MD.  This visit occurred during the SARS-CoV-2 public health emergency.  Safety protocols were in place, including screening questions prior to the visit, additional usage of staff PPE, and extensive cleaning of exam room while observing appropriate contact time as indicated for disinfecting solutions.  Subjective:     Patient ID: Mary Neal , female    DOB: [birth year] , 56 y.o.   MRN: [id number]   Chief Complaint  Patient presents with   Hypertension    HPI  She presents today for BP check. She reports compliance with meds. She is accompanied by her Mother today. She denies headaches, palpitations and shortness of breath.   Hypertension This is a chronic problem. The problem has been gradually improving since onset. The problem is controlled. Pertinent negatives include no shortness of breath. Past treatments include angiotensin blockers. The current treatment provides moderate improvement.    Past Medical History:  Diagnosis Date   Abnormal Pap smear 2013   Dislocation of right hip ([place]) 06/09/2018   Per MRI   History of knee replacement 2005   Spina bifida [facility])    Uterine prolapse    Wears glasses      Family History  Problem Relation Age of Onset   Hypertension Mother    Hyperlipidemia Mother    Heart attack Brother      Current Outpatient Medications:    Digestive Enzymes (SUPER PAPAYA PLUS PO), Take 1 capsule by mouth daily at 2 am., Disp: , Rfl:    famotidine (PEPCID) 20 MG tablet, TAKE 1 TABLET BY MOUTH EVERY DAY, Disp: 90 tablet, Rfl: 1   Magnesium 300 MG CAPS, Take by mouth., Disp: , Rfl:    meloxicam (MOBIC) 7.5 MG tablet, Take 1 tablet by mouth x 5 days then one tab daily as needed, Disp: 30 tablet, Rfl: 1   oxybutynin (DITROPAN XL)  15 MG 24 hr tablet, TAKE 1 TABLET BY MOUTH  TWICE DAILY, Disp: 180 tablet, Rfl: 3   tobramycin-dexamethasone (TOBRADEX) ophthalmic ointment, Place 1 application into the right eye 3 (three) times daily., Disp: 3.5 g, Rfl: 0   UNABLE TO FIND, 1 capsule daily at 2 am. Med Name: enzyme plus, Disp: , Rfl:    valsartan (DIOVAN) 160 MG tablet, TAKE 1 TABLET BY MOUTH  DAILY, Disp: 90 tablet, Rfl: 3   vitamin C (ASCORBIC ACID) 500 MG tablet, Take by mouth., Disp: , Rfl:    Allergies  Allergen Reactions   Clindamycin/Lincomycin Palpitations   Latex Other (See Comments)   Penicillins Other (See Comments)     Review of Systems  Constitutional: Negative.   HENT:         She has stye on her eye. First noticed in the past four days. No problems with her vision. It is tender to touch.   Respiratory: Negative.  Negative for shortness of breath.   Cardiovascular: Negative.   Gastrointestinal: Negative.   Neurological: Negative.   Psychiatric/Behavioral: Negative.      Today's Vitals   09/01/21 1620 09/01/21 1709  BP: 136/68 136/74  Pulse: 82   Temp: 98.7 F (37.1 C)   Weight: 108 lb (49 kg)   Height: 4\' 9"  (1.448 m)   PainSc: 0-No pain    Body mass index is 23.37 kg/m.  Wt Readings from Last 3 Encounters:  09/01/21 108 lb (49 kg)  07/07/21 95 lb (43.1 kg)  07/07/21 95 lb (43.1 kg)     Objective:  Physical Exam Vitals and nursing note reviewed.  Constitutional:      Appearance: Normal appearance.  HENT:     Head: Normocephalic and atraumatic.     Nose:     Comments: Masked     Mouth/Throat:     Comments: Masked  Eyes:     General:        Right eye: Hordeolum present.     Extraocular Movements: Extraocular movements intact.  Cardiovascular:     Rate and Rhythm: Regular rhythm.     Heart sounds: Normal heart sounds.  Pulmonary:     Effort: Pulmonary effort is normal.     Breath sounds: Normal breath sounds.  Musculoskeletal:     Cervical back: Normal range of motion.      Comments: Ambulatory w/ crutches  Skin:    General: Skin is warm.  Neurological:     General: No focal deficit present.     Mental Status: She is alert.  Psychiatric:        Mood and Affect: Mood normal.        Behavior: Behavior normal.        Assessment And Plan:     1. Essential hypertension, benign Comments: Chronic, fair control. Goal BP<130/80. Importance of medication/dietary compliance was d/w patient in full detail. No med changes today. F/u 4-6 months.  2. Hordeolum externum of right upper eyelid Comments: Advised to apply warm compress. I will send rx Tobramycin ointment to apply to affected area bid. Advised to contact her ophthalmologist if sx worsen.   3. Bilateral low back pain without sciatica, unspecified chronicity Comments: She is advised to apply topical pain cream, like mentholated cream Vicks to affected area BID prn. Also encouraged to work on posture when watching TV.   4. Paraparesis ([place]) Comments: Ambulatory with crutches.    Patient was given opportunity to ask questions. Patient verbalized understanding of the plan and was able to repeat key elements of the plan. All questions were answered to their satisfaction.   I, Maximino Greenland, MD, have reviewed all documentation for this visit. The documentation on 09/12/21 for the exam, diagnosis, procedures, and orders are all accurate and complete.   IF YOU HAVE BEEN REFERRED TO A SPECIALIST, IT MAY TAKE 1-2 WEEKS TO SCHEDULE/PROCESS THE REFERRAL. IF YOU HAVE NOT HEARD FROM US/SPECIALIST IN TWO WEEKS, PLEASE GIVE [place] A CALL AT [phone].   THE PATIENT IS ENCOURAGED TO PRACTICE SOCIAL DISTANCING DUE TO THE COVID-19 PANDEMIC.

## 2021-09-01 NOTE — Patient Instructions (Signed)

## 2021-09-03 ENCOUNTER — Telehealth: Payer: Medicare Other

## 2021-09-12 ENCOUNTER — Encounter: Payer: Self-pay | Admitting: Internal Medicine

## 2021-09-14 ENCOUNTER — Telehealth: Payer: Medicare Other

## 2021-09-14 ENCOUNTER — Ambulatory Visit (INDEPENDENT_AMBULATORY_CARE_PROVIDER_SITE_OTHER): Payer: Commercial Managed Care - HMO

## 2021-09-14 DIAGNOSIS — Q059 Spina bifida, unspecified: Secondary | ICD-10-CM

## 2021-09-14 DIAGNOSIS — R252 Cramp and spasm: Secondary | ICD-10-CM

## 2021-09-14 DIAGNOSIS — I10 Essential (primary) hypertension: Secondary | ICD-10-CM

## 2021-09-14 DIAGNOSIS — Q6589 Other specified congenital deformities of hip: Secondary | ICD-10-CM

## 2021-09-14 DIAGNOSIS — M1712 Unilateral primary osteoarthritis, left knee: Secondary | ICD-10-CM

## 2021-09-14 DIAGNOSIS — K219 Gastro-esophageal reflux disease without esophagitis: Secondary | ICD-10-CM

## 2021-09-14 NOTE — Patient Instructions (Addendum)
Visit Information ? ?Thank you for taking time to visit with me today. Please don't hesitate to contact me if I can be of assistance to you before our next scheduled telephone appointment. ? ?Following are the goals we discussed today:  ?(Copy and paste patient goals from clinical care plan here) ? ?Our next appointment is by telephone on 01/07/22 at 12 PM  ? ?Please call the care guide team at [phone] if you need to cancel or reschedule your appointment.  ? ?If you are experiencing a Mental Health or [place] or need someone to talk to, please call 1-800-273-TALK (toll free, 24 hour hotline)  ? ?Patient verbalizes understanding of instructions and care plan provided today and agrees to view in [place]. Active MyChart status confirmed with patient.   ? ?Barb Merino, RN, BSN, CCM ?Care Management Coordinator ?[facility]  ?Direct Phone: [phone] ? ? ?

## 2021-09-14 NOTE — Chronic Care Management (AMB) (Signed)
Chronic Care Management   CCM RN Visit Note  09/14/2021 Name: Mary Neal MRN: [id number] DOB: 18-Nov-1965  Subjective: Mary Neal is a 56 y.o. year old female who is a primary care patient of Glendale Chard, MD. The care management team was consulted for assistance with disease management and care coordination needs.    Engaged with patient by telephone for follow up visit in response to provider referral for case management and/or care coordination services.   Consent to Services:  The patient was given information about Chronic Care Management services, agreed to services, and gave verbal consent prior to initiation of services.  Please see initial visit note for detailed documentation.   Patient agreed to services and verbal consent obtained.   Assessment: Review of patient past medical history, allergies, medications, health status, including review of consultants reports, laboratory and other test data, was performed as part of comprehensive evaluation and provision of chronic care management services.   SDOH (Social Determinants of Health) assessments and interventions performed:  Yes, no acute needs   CCM Care Plan  Allergies  Allergen Reactions   Clindamycin/Lincomycin Palpitations   Latex Other (See Comments)   Penicillins Other (See Comments)    Outpatient Encounter Medications as of 09/14/2021  Medication Sig   Digestive Enzymes (SUPER PAPAYA PLUS PO) Take 1 capsule by mouth daily at 2 am.   famotidine (PEPCID) 20 MG tablet TAKE 1 TABLET BY MOUTH EVERY DAY   Magnesium 300 MG CAPS Take by mouth.   meloxicam (MOBIC) 7.5 MG tablet Take 1 tablet by mouth x 5 days then one tab daily as needed   oxybutynin (DITROPAN XL) 15 MG 24 hr tablet TAKE 1 TABLET BY MOUTH  TWICE DAILY   tobramycin-dexamethasone (TOBRADEX) ophthalmic ointment Place 1 application into the right eye 3 (three) times daily.   UNABLE TO FIND 1 capsule daily at 2 am. Med Name: enzyme plus    valsartan (DIOVAN) 160 MG tablet TAKE 1 TABLET BY MOUTH  DAILY   vitamin C (ASCORBIC ACID) 500 MG tablet Take by mouth.   No facility-administered encounter medications on file as of 09/14/2021.    Patient Active Problem List   Diagnosis Date Noted   Visit for routine gyn exam 08/10/2020   Pelvic relaxation 08/10/2020   Essential hypertension 01/28/2020   Gastroesophageal reflux disease without esophagitis 07/12/2019   Nonrheumatic mitral valve regurgitation 05/08/2019   Atypical chest pain 04/17/2019   Neurogenic bowel 12/13/2018   Neurogenic bladder 12/07/2018   Peripheral motor neuropathy 12/07/2018   Osteoarthritis of right hip joint due to dysplasia 11/13/2018   Chronic pain of right knee 06/06/2018   Congenital dysplasia of right hip 06/06/2018   Osteoarthritis of left knee 06/17/2013   Spina bifida without hydrocephalus ([place]) 03/25/2013   Injury of ligament of left knee 03/25/2013   Paraparesis ([place]) 03/25/2013   Tear of medial collateral ligament of knee (left) 03/25/2013   Fibroids 02/14/2012    Conditions to be addressed/monitored: Spina bifida without hydrocephalus, unspecified, GERD, Osteoarthritis of left knee, Congenital dysplasia or right hip, HTN, Muscle cramps  Care Plan : RN Care Manager Plan of Care  Updates made by Lynne Logan, RN since 09/14/2021 12:00 AM     Problem: No plan established for management of chronic disease states (Spina bifida without hydrocephalus, unspecified, GERD, Osteoarthritis of left knee, Congenital dysplasia or right hip, HTN, Muscle cramps)   Priority: High     Long-Range Goal: Development of plan of care for  management of chronic states (Spina bifida without hydrocephalus, unspecified, GERD, Osteoarthritis of left knee, Congenital dysplasia or right hip, HTN, Muscle cramps)   Start Date: 06/28/2021  Expected End Date: 06/28/2022  Recent Progress: On track  Priority: High  Note:   Current Barriers:  Knowledge Deficits related  to plan of care for management of Spina bifida without hydrocephalus, unspecified, GERD, Osteoarthritis of left knee, Congenital dysplasia or right hip, HTN, Muscle cramps  Chronic Disease Management support and education needs related to Spina bifida without hydrocephalus, unspecified, GERD, Osteoarthritis of left knee, Congenital dysplasia or right hip, HTN, Muscle cramps  Impaired Physical Mobility   RNCM Clinical Goal(s):  Patient will verbalize basic understanding of  Spina bifida without hydrocephalus, unspecified, GERD, Osteoarthritis of left knee, Congenital dysplasia or right hip, HTN, Muscle cramps disease process and self health management plan as evidenced by patient will report having no disease exacerbations related to her prescribed treatment plan  take all medications exactly as prescribed and will call provider for medication related questions as evidenced by patient will report having no missed doses of her prescribed medications  demonstrate Ongoing health management independence as evidenced by patient will report 100 % adherence to her prescribed treatment plan  continue to work with RN Care Manager to address care management and care coordination needs related to  Spina bifida without hydrocephalus, unspecified, GERD, Osteoarthritis of left knee, Congenital dysplasia or right hip, HTN, Muscle cramps as evidenced by adherence to CM Team Scheduled appointments through collaboration with RN Care manager, provider, and care team.   Interventions: 1:1 collaboration with primary care provider regarding development and update of comprehensive plan of care as evidenced by provider attestation and co-signature Inter-disciplinary care team collaboration (see longitudinal plan of care) Evaluation of current treatment plan related to  self management and patient's adherence to plan as established by provider  Muscle Cramps:  (Status:  Goal on track:  Yes.)  Long Term Goal Evaluation of  current treatment plan related to  Muscle cramps , self-management and patient's adherence to plan as established by provider Reviewed medications with patient and discussed importance of medication adherence, determined patient is taking Magnesium Glycinate 300 mg daily with good effectiveness as evidence by mother reports patient's cramps have subsided  Educated on importance of staying well hydrated, shoot for 48-64 oz of water daily unless otherwise directed  Encouraged mother to keep PCP informed of new or worsening muscle cramps Discussed plans with patient for ongoing care management follow up and provided patient with direct contact information for care management team   Hypertension Interventions:  (Status:  Goal on track:  Yes.) Long Term Goal Last practice recorded BP readings:  BP Readings from Last 3 Encounters:  09/01/21 136/74  07/07/21 102/70  07/07/21 102/70  Most recent eGFR/CrCl:  Lab Results  Component Value Date   EGFR 106 06/24/2021    No components found for: CRCL Evaluation of current treatment plan related to hypertension self management and patient's adherence to plan as established by provider Reviewed medications with patient and discussed importance of compliance Counseled on the importance of exercise goals with target of 150 minutes per week Advised patient, providing education and rationale, to monitor blood pressure daily and record, calling PCP for findings outside established parameters Provided education on prescribed diet low Sodium Assessed social determinant of health barriers Mailed printed educational materials related to Chair Exercises  Bilateral low back pain without sciatica, unspecified chronicity:  (Status:  New goal.)  Long Term  Goal Evaluation of current treatment plan related to  bilateral low back pain without sciatica , self-management and patient's adherence to plan as established by provider Review of patient status, including review  of consultant's reports, relevant laboratory and other test results, and medications completed, reviewed PCP recommendations Educated on importance of implementing a daily exercise regimen to help strengthen lower back muscles, build stamina and improve endurance Mailed printed educational materials related to Chair Exercises Discussed plans with patient for ongoing care management follow up and provided patient with direct contact information for care management team   Stye of right upper eyelid:  (Status:  New goal.)  Short Term Goal Evaluation of current treatment plan related to  right upper eyelid , self-management and patient's adherence to plan of care as established by provider  Reviewed PCP recommendations, per PCP patient/mother advised to apply warm compress, start Tobramycin ointment to apply to affected area bid, PCP recommended patient/mother contact her ophthalmologist if sx worsen Determined patient's mother the eye doctor instructed patient to continue to apply moist heat to her affected eye until the stye resolves Determined per patient's mother, the recommendations are working as evidence by patient's stye is smaller and seems to be resolving  Encouraged mother to alert PCP and or Ophthalmologist of worsening condition  Discussed plans with patient for ongoing care management follow up and provided patient with direct contact information for care management team   Patient Goals/Self-Care Activities: Take all medications as prescribed Attend all scheduled provider appointments Call pharmacy for medication refills 3-7 days in advance of running out of medications Call provider office for new concerns or questions  keep a blood pressure log take blood pressure log to all doctor appointments call doctor for signs and symptoms of high blood pressure take medications for blood pressure exactly as prescribed report new symptoms to your doctor  Follow Up Plan:  Telephone follow up  appointment with care management team member scheduled for:  01/07/22     Barb Merino, RN, BSN, CCM Care Management Coordinator [place] [facility]  Direct Phone: [phone]

## 2021-10-01 DIAGNOSIS — R339 Retention of urine, unspecified: Secondary | ICD-10-CM | POA: Diagnosis not present

## 2021-11-05 DIAGNOSIS — R339 Retention of urine, unspecified: Secondary | ICD-10-CM | POA: Diagnosis not present

## 2021-11-16 ENCOUNTER — Other Ambulatory Visit: Payer: Self-pay | Admitting: Internal Medicine

## 2021-11-30 ENCOUNTER — Other Ambulatory Visit: Payer: Self-pay | Admitting: Internal Medicine

## 2021-12-01 ENCOUNTER — Other Ambulatory Visit: Payer: Medicare Other

## 2021-12-01 DIAGNOSIS — Z111 Encounter for screening for respiratory tuberculosis: Secondary | ICD-10-CM

## 2021-12-05 LAB — QUANTIFERON-TB GOLD PLUS
QuantiFERON Mitogen Value: >10 IU/mL
QuantiFERON Nil Value: 0.01 IU/mL
QuantiFERON TB1 Ag Value: 0.03 IU/mL
QuantiFERON TB2 Ag Value: 0.04 IU/mL
QuantiFERON-TB Gold Plus: NEGATIVE

## 2021-12-10 DIAGNOSIS — R339 Retention of urine, unspecified: Secondary | ICD-10-CM | POA: Diagnosis not present

## 2022-01-05 ENCOUNTER — Ambulatory Visit (INDEPENDENT_AMBULATORY_CARE_PROVIDER_SITE_OTHER): Payer: Medicare Other | Admitting: Internal Medicine

## 2022-01-05 ENCOUNTER — Encounter: Payer: Self-pay | Admitting: Internal Medicine

## 2022-01-05 VITALS — BP 150/92 | HR 78 | Temp 98.2°F

## 2022-01-05 DIAGNOSIS — K219 Gastro-esophageal reflux disease without esophagitis: Secondary | ICD-10-CM | POA: Diagnosis not present

## 2022-01-05 DIAGNOSIS — Q059 Spina bifida, unspecified: Secondary | ICD-10-CM | POA: Diagnosis not present

## 2022-01-05 DIAGNOSIS — Z79899 Other long term (current) drug therapy: Secondary | ICD-10-CM

## 2022-01-05 DIAGNOSIS — I10 Essential (primary) hypertension: Secondary | ICD-10-CM

## 2022-01-05 NOTE — Progress Notes (Signed)
Rich Brave [place]leton,acting as a Education administrator for Maximino Greenland, MD.,have documented all relevant documentation on the behalf of Maximino Greenland, MD,as directed by  Maximino Greenland, MD while in the presence of Maximino Greenland, MD.  This visit occurred during the SARS-CoV-2 public health emergency.  Safety protocols were in place, including screening questions prior to the visit, additional usage of staff PPE, and extensive cleaning of exam room while observing appropriate contact time as indicated for disinfecting solutions.  Subjective:     Patient ID: Mary Neal , female    DOB: [birth year] , 56 y.o.   MRN: [id number]   Chief Complaint  Patient presents with   Hypertension    HPI  She presents today for BP check. She reports compliance with meds. She is accompanied by her Mother today. She denies headaches, palpitations and shortness of breath.   Hypertension This is a chronic problem. The problem has been gradually improving since onset. The problem is controlled. Pertinent negatives include no shortness of breath. Past treatments include angiotensin blockers. The current treatment provides moderate improvement.     Past Medical History:  Diagnosis Date   Abnormal Pap smear 2013   Dislocation of right hip ([place]) 06/09/2018   Per MRI   History of knee replacement 2005   Spina bifida [facility])    Uterine prolapse    Wears glasses      Family History  Problem Relation Age of Onset   Hypertension Mother    Hyperlipidemia Mother    Heart attack Brother      Current Outpatient Medications:    Digestive Enzymes (SUPER PAPAYA PLUS PO), Take 1 capsule by mouth daily at 2 am., Disp: , Rfl:    famotidine (PEPCID) 20 MG tablet, TAKE 1 TABLET BY MOUTH  DAILY, Disp: 100 tablet, Rfl: 2   Magnesium 300 MG CAPS, Take by mouth., Disp: , Rfl:    meloxicam (MOBIC) 7.5 MG tablet, Take 1 tablet by mouth x 5 days then one tab daily as needed, Disp: 30 tablet, Rfl: 1   oxybutynin (DITROPAN XL)  15 MG 24 hr tablet, TAKE 1 TABLET BY MOUTH  TWICE DAILY, Disp: 180 tablet, Rfl: 3   tobramycin-dexamethasone (TOBRADEX) ophthalmic ointment, Place 1 application into the right eye 3 (three) times daily., Disp: 3.5 g, Rfl: 0   UNABLE TO FIND, 1 capsule daily at 2 am. Med Name: enzyme plus, Disp: , Rfl:    valsartan (DIOVAN) 160 MG tablet, TAKE 1 TABLET BY MOUTH  DAILY, Disp: 90 tablet, Rfl: 3   vitamin C (ASCORBIC ACID) 500 MG tablet, Take by mouth., Disp: , Rfl:    Allergies  Allergen Reactions   Clindamycin/Lincomycin Palpitations   Latex Other (See Comments)   Penicillins Other (See Comments)     Review of Systems  Constitutional: Negative.   Respiratory: Negative.  Negative for shortness of breath.   Cardiovascular: Negative.   Neurological: Negative.   Psychiatric/Behavioral: Negative.       Today's Vitals   01/05/22 1448 01/05/22 1501  BP: (!) 148/68 (!) 150/92  Pulse: 78   Temp: 98.2 F (36.8 C)    There is no height or weight on file to calculate BMI.   BP Readings from Last 3 Encounters:  01/05/22 (!) 150/92  09/01/21 136/74  07/07/21 102/70     Objective:  Physical Exam Vitals and nursing note reviewed.  Constitutional:      Appearance: Normal appearance.  HENT:     Head:  Normocephalic and atraumatic.  Eyes:     Extraocular Movements: Extraocular movements intact.  Cardiovascular:     Rate and Rhythm: Normal rate and regular rhythm.     Heart sounds: Normal heart sounds.  Pulmonary:     Effort: Pulmonary effort is normal.     Breath sounds: Normal breath sounds.  Musculoskeletal:     Cervical back: Normal range of motion.     Comments: Ambulatory with crutches   Skin:    General: Skin is warm.  Neurological:     General: No focal deficit present.     Mental Status: She is alert.  Psychiatric:        Mood and Affect: Mood normal.        Behavior: Behavior normal.      Assessment And Plan:     1. Essential hypertension, benign Comments:  Uncontrolled. No med changes today. [facility]. She agrees to f/u in six weeks.  I will check renal function.  - CMP14+EGFR - Insulin, random(561)  2. Spina bifida without hydrocephalus, unspecified spinal region [facility]) Comments: Chronic, ambulatory with crutches.   3. Gastroesophageal reflux disease without esophagitis Comments: Chronic, on ppi therapy. I will check vitamin B12 level today.   4. Drug therapy - Vitamin B12   Patient was given opportunity to ask questions. Patient verbalized understanding of the plan and was able to repeat key elements of the plan. All questions were answered to their satisfaction.   I, Maximino Greenland, MD, have reviewed all documentation for this visit. The documentation on 01/05/22 for the exam, diagnosis, procedures, and orders are all accurate and complete.   IF YOU HAVE BEEN REFERRED TO A SPECIALIST, IT MAY TAKE 1-2 WEEKS TO SCHEDULE/PROCESS THE REFERRAL. IF YOU HAVE NOT HEARD FROM US/SPECIALIST IN TWO WEEKS, PLEASE GIVE [place] A CALL AT [phone].   THE PATIENT IS ENCOURAGED TO PRACTICE SOCIAL DISTANCING DUE TO THE COVID-19 PANDEMIC.

## 2022-01-05 NOTE — Patient Instructions (Signed)
Hypertension, Adult ?Hypertension is another name for high blood pressure. High blood pressure forces your heart to work harder to pump blood. This can cause problems over time. ?There are two numbers in a blood pressure reading. There is a top number (systolic) over a bottom number (diastolic). It is best to have a blood pressure that is below 120/80. ?What are the causes? ?The cause of this condition is not known. Some other conditions can lead to high blood pressure. ?What increases the risk? ?Some lifestyle factors can make you more likely to develop high blood pressure: ?Smoking. ?Not getting enough exercise or physical activity. ?Being overweight. ?Having too much fat, sugar, calories, or salt (sodium) in your diet. ?Drinking too much alcohol. ?Other risk factors include: ?Having any of these conditions: ?Heart disease. ?Diabetes. ?High cholesterol. ?Kidney disease. ?Obstructive sleep apnea. ?Having a family history of high blood pressure and high cholesterol. ?Age. The risk increases with age. ?Stress. ?What are the signs or symptoms? ?High blood pressure may not cause symptoms. Very high blood pressure (hypertensive crisis) may cause: ?Headache. ?Fast or uneven heartbeats (palpitations). ?Shortness of breath. ?Nosebleed. ?Vomiting or feeling like you may vomit (nauseous). ?Changes in how you see. ?Very bad chest pain. ?Feeling dizzy. ?Seizures. ?How is this treated? ?This condition is treated by making healthy lifestyle changes, such as: ?Eating healthy foods. ?Exercising more. ?Drinking less alcohol. ?Your doctor may prescribe medicine if lifestyle changes do not help enough and if: ?Your top number is above 130. ?Your bottom number is above 80. ?Your personal target blood pressure may vary. ?Follow these instructions at home: ?Eating and drinking ? ?If told, follow the DASH eating plan. To follow this plan: ?Fill one half of your plate at each meal with fruits and vegetables. ?Fill one fourth of your plate  at each meal with whole grains. Whole grains include whole-wheat pasta, brown rice, and whole-grain bread. ?Eat or drink low-fat dairy products, such as skim milk or low-fat yogurt. ?Fill one fourth of your plate at each meal with low-fat (lean) proteins. Low-fat proteins include fish, chicken without skin, eggs, beans, and tofu. ?Avoid fatty meat, cured and processed meat, or chicken with skin. ?Avoid pre-made or processed food. ?Limit the amount of salt in your diet to less than 1,500 mg each day. ?Do not drink alcohol if: ?Your doctor tells you not to drink. ?You are pregnant, may be pregnant, or are planning to become pregnant. ?If you drink alcohol: ?Limit how much you have to: ?0-1 drink a day for women. ?0-2 drinks a day for men. ?Know how much alcohol is in your drink. In the [place] one drink equals one 12 oz bottle of beer (355 mL), one 5 oz glass of wine (148 mL), or one 1? oz glass of hard liquor (44 mL). ?Lifestyle ? ?Work with your doctor to stay at a healthy weight or to lose weight. Ask your doctor what the best weight is for you. ?Get at least 30 minutes of exercise that causes your heart to beat faster (aerobic exercise) most days of the week. This may include walking, swimming, or biking. ?Get at least 30 minutes of exercise that strengthens your muscles (resistance exercise) at least 3 days a week. This may include lifting weights or doing Pilates. ?Do not smoke or use any products that contain nicotine or tobacco. If you need help quitting, ask your doctor. ?Check your blood pressure at home as told by your doctor. ?Keep all follow-up visits. ?Medicines ?Take over-the-counter and prescription medicines   only as told by your doctor. Follow directions carefully. ?Do not skip doses of blood pressure medicine. The medicine does not work as well if you skip doses. Skipping doses also puts you at risk for problems. ?Ask your doctor about side effects or reactions to medicines that you should watch  for. ?Contact a doctor if: ?You think you are having a reaction to the medicine you are taking. ?You have headaches that keep coming back. ?You feel dizzy. ?You have swelling in your ankles. ?You have trouble with your vision. ?Get help right away if: ?You get a very bad headache. ?You start to feel mixed up (confused). ?You feel weak or numb. ?You feel faint. ?You have very bad pain in your: ?Chest. ?Belly (abdomen). ?You vomit more than once. ?You have trouble breathing. ?These symptoms may be an emergency. Get help right away. Call 911. ?Do not wait to see if the symptoms will go away. ?Do not drive yourself to the hospital. ?Summary ?Hypertension is another name for high blood pressure. ?High blood pressure forces your heart to work harder to pump blood. ?For most people, a normal blood pressure is less than 120/80. ?Making healthy choices can help lower blood pressure. If your blood pressure does not get lower with healthy choices, you may need to take medicine. ?This information is not intended to replace advice given to you by your health care provider. Make sure you discuss any questions you have with your health care provider. ?Document Revised: 04/15/2021 Document Reviewed: 04/15/2021 ?Elsevier Patient Education ? 2023 Elsevier Inc. ? ?

## 2022-01-06 LAB — CMP14+EGFR
ALT: 21 IU/L (ref 0–32)
AST: 24 IU/L (ref 0–40)
Albumin/Globulin Ratio: 2 (ref 1.2–2.2)
Albumin: 4.2 g/dL (ref 3.8–4.9)
Alkaline Phosphatase: 76 IU/L (ref 44–121)
BUN/Creatinine Ratio: 24 — ABNORMAL HIGH (ref 9–23)
BUN: 16 mg/dL (ref 6–24)
Bilirubin Total: 0.4 mg/dL (ref 0.0–1.2)
CO2: 21 mmol/L (ref 20–29)
Calcium: 9.6 mg/dL (ref 8.7–10.2)
Chloride: 102 mmol/L (ref 96–106)
Creatinine, Ser: 0.68 mg/dL (ref 0.57–1.00)
Globulin, Total: 2.1 g/dL (ref 1.5–4.5)
Glucose: 78 mg/dL (ref 70–99)
Potassium: 3.8 mmol/L (ref 3.5–5.2)
Sodium: 138 mmol/L (ref 134–144)
Total Protein: 6.3 g/dL (ref 6.0–8.5)
eGFR: 102 mL/min/{1.73_m2} (ref >59)

## 2022-01-06 LAB — VITAMIN B12: Vitamin B-12: 899 pg/mL (ref 232–1245)

## 2022-01-06 LAB — INSULIN, RANDOM: INSULIN: 5.7 u[IU]/mL (ref 2.6–24.9)

## 2022-01-07 ENCOUNTER — Ambulatory Visit (INDEPENDENT_AMBULATORY_CARE_PROVIDER_SITE_OTHER): Payer: Medicare Other

## 2022-01-07 ENCOUNTER — Telehealth: Payer: Commercial Managed Care - HMO

## 2022-01-07 DIAGNOSIS — Q6589 Other specified congenital deformities of hip: Secondary | ICD-10-CM

## 2022-01-07 DIAGNOSIS — M1712 Unilateral primary osteoarthritis, left knee: Secondary | ICD-10-CM

## 2022-01-07 DIAGNOSIS — Q059 Spina bifida, unspecified: Secondary | ICD-10-CM

## 2022-01-07 DIAGNOSIS — I10 Essential (primary) hypertension: Secondary | ICD-10-CM

## 2022-01-07 DIAGNOSIS — K219 Gastro-esophageal reflux disease without esophagitis: Secondary | ICD-10-CM

## 2022-01-07 DIAGNOSIS — R252 Cramp and spasm: Secondary | ICD-10-CM

## 2022-01-07 NOTE — Chronic Care Management (AMB) (Signed)
Chronic Care Management   CCM RN Visit Note  01/07/2022 Name: Mary Neal MRN: [id number] DOB: 06/10/1966  Subjective: Mary Neal is a 56 y.o. year old female who is a primary care patient of Glendale Chard, MD. The care management team was consulted for assistance with disease management and care coordination needs.    Engaged with patient by telephone for follow up visit in response to provider referral for case management and/or care coordination services.   Consent to Services:  The patient was given information about Chronic Care Management services, agreed to services, and gave verbal consent prior to initiation of services.  Please see initial visit note for detailed documentation.   Patient agreed to services and verbal consent obtained.   Assessment: Review of patient past medical history, allergies, medications, health status, including review of consultants reports, laboratory and other test data, was performed as part of comprehensive evaluation and provision of chronic care management services.   SDOH (Social Determinants of Health) assessments and interventions performed:  yes, no acute needs   CCM Care Plan  Allergies  Allergen Reactions   Clindamycin/Lincomycin Palpitations   Latex Other (See Comments)   Penicillins Other (See Comments)    Outpatient Encounter Medications as of 01/07/2022  Medication Sig   Digestive Enzymes (SUPER PAPAYA PLUS PO) Take 1 capsule by mouth daily at 2 am.   famotidine (PEPCID) 20 MG tablet TAKE 1 TABLET BY MOUTH  DAILY   Magnesium 300 MG CAPS Take by mouth.   meloxicam (MOBIC) 7.5 MG tablet Take 1 tablet by mouth x 5 days then one tab daily as needed   oxybutynin (DITROPAN XL) 15 MG 24 hr tablet TAKE 1 TABLET BY MOUTH  TWICE DAILY   tobramycin-dexamethasone (TOBRADEX) ophthalmic ointment Place 1 application into the right eye 3 (three) times daily.   UNABLE TO FIND 1 capsule daily at 2 am. Med Name: enzyme plus    valsartan (DIOVAN) 160 MG tablet TAKE 1 TABLET BY MOUTH  DAILY   vitamin C (ASCORBIC ACID) 500 MG tablet Take by mouth.   No facility-administered encounter medications on file as of 01/07/2022.    Patient Active Problem List   Diagnosis Date Noted   Visit for routine gyn exam 08/10/2020   Pelvic relaxation 08/10/2020   Essential hypertension 01/28/2020   Gastroesophageal reflux disease without esophagitis 07/12/2019   Nonrheumatic mitral valve regurgitation 05/08/2019   Atypical chest pain 04/17/2019   Neurogenic bowel 12/13/2018   Neurogenic bladder 12/07/2018   Peripheral motor neuropathy 12/07/2018   Osteoarthritis of right hip joint due to dysplasia 11/13/2018   Chronic pain of right knee 06/06/2018   Congenital dysplasia of right hip 06/06/2018   Osteoarthritis of left knee 06/17/2013   Spina bifida without hydrocephalus ([place]) 03/25/2013   Injury of ligament of left knee 03/25/2013   Paraparesis ([place]) 03/25/2013   Tear of medial collateral ligament of knee (left) 03/25/2013   Fibroids 02/14/2012    Conditions to be addressed/monitored: Spina bifida without hydrocephalus, unspecified, GERD, Osteoarthritis of left knee, Congenital dysplasia or right hip, HTN, Muscle cramps  Care Plan : RN Care Manager Plan of Care  Updates made by Lynne Logan, RN since 01/07/2022 12:00 AM     Problem: No plan established for management of chronic disease states (Spina bifida without hydrocephalus, unspecified, GERD, Osteoarthritis of left knee, Congenital dysplasia or right hip, HTN, Muscle cramps)   Priority: High     Long-Range Goal: Development of plan of care for  management of chronic states (Spina bifida without hydrocephalus, unspecified, GERD, Osteoarthritis of left knee, Congenital dysplasia or right hip, HTN, Muscle cramps)   Start Date: 06/28/2021  Expected End Date: 06/28/2022  Recent Progress: On track  Priority: High  Note:   Current Barriers:  Knowledge Deficits  related to plan of care for management of Spina bifida without hydrocephalus, unspecified, GERD, Osteoarthritis of left knee, Congenital dysplasia or right hip, HTN, Muscle cramps  Chronic Disease Management support and education needs related to Spina bifida without hydrocephalus, unspecified, GERD, Osteoarthritis of left knee, Congenital dysplasia or right hip, HTN, Muscle cramps  Impaired Physical Mobility   RNCM Clinical Goal(s):  Patient will verbalize basic understanding of  Spina bifida without hydrocephalus, unspecified, GERD, Osteoarthritis of left knee, Congenital dysplasia or right hip, HTN, Muscle cramps disease process and self health management plan as evidenced by patient will report having no disease exacerbations related to her prescribed treatment plan  take all medications exactly as prescribed and will call provider for medication related questions as evidenced by patient will report having no missed doses of her prescribed medications  demonstrate Ongoing health management independence as evidenced by patient will report 100 % adherence to her prescribed treatment plan  continue to work with RN Care Manager to address care management and care coordination needs related to  Spina bifida without hydrocephalus, unspecified, GERD, Osteoarthritis of left knee, Congenital dysplasia or right hip, HTN, Muscle cramps as evidenced by adherence to CM Team Scheduled appointments through collaboration with RN Care manager, provider, and care team.   Interventions: 1:1 collaboration with primary care provider regarding development and update of comprehensive plan of care as evidenced by provider attestation and co-signature Inter-disciplinary care team collaboration (see longitudinal plan of care) Evaluation of current treatment plan related to  self management and patient's adherence to plan as established by provider  Muscle Cramps:  (Status:  Goal on track:  Yes.)  Long Term Goal Evaluation  of current treatment plan related to  Muscle cramps , self-management and patient's adherence to plan as established by provider Reviewed medications with patient and discussed importance of medication adherence, determined patient is taking Magnesium Glycinate 300 mg daily with good effectiveness as evidence by mother reports patient's cramps have subsided  Educated on importance of staying well hydrated, shoot for 48-64 oz of water daily unless otherwise directed  Encouraged mother to keep PCP informed of new or worsening muscle cramps Educated patient on nonpharmacologic interventions such as using peppermint essential oil and most heat the to affected extremity  Educated on benefits of adding dietary foods that contain tumeric to help manage muscle cramps   Discussed plans with patient for ongoing care management follow up and provided patient with direct contact information for care management team    Hypertension Interventions:  (Status:  Goal on track:  Yes.) Long Term Goal Last practice recorded BP readings:  BP Readings from Last 3 Encounters:  01/05/22 (!) 150/92  09/01/21 136/74  07/07/21 102/70  Most recent eGFR/CrCl:  Lab Results  Component Value Date   EGFR 102 01/05/2022    No components found for: "CRCL" Evaluation of current treatment plan related to hypertension self management and patient's adherence to plan as established by provider Advised patient, providing education and rationale, to monitor blood pressure daily and record, calling PCP for findings outside established parameters Determined mother [name] admits patient has anxiety when needing to visit her PCP, she believes this is the reason for the elevated BP  during her visit Determined mother [name] does not feel comfortable monitoring patient's BP at home, discussed patient's BP is measured monthly during by the adult daycare staff, she will have patient's BP recorded and will share with Dr. Baird Cancer  Provided  education on prescribed diet low Sodium Discussed complications of poorly controlled blood pressure such as heart disease, stroke, circulatory complications, vision complications, kidney impairment, sexual dysfunction Mailed printed educational materials related to What is High Blood Pressure?; Why Should I Limit Sodium?    Bilateral low back pain without sciatica, unspecified chronicity:  (Status:  Condition stable.  Not addressed this visit.)  Long Term Goal Evaluation of current treatment plan related to  bilateral low back pain without sciatica , self-management and patient's adherence to plan as established by provider Review of patient status, including review of consultant's reports, relevant laboratory and other test results, and medications completed, reviewed PCP recommendations Educated on importance of implementing a daily exercise regimen to help strengthen lower back muscles, build stamina and improve endurance Mailed printed educational materials related to Chair Exercises Discussed plans with patient for ongoing care management follow up and provided patient with direct contact information for care management team   Stye of right upper eyelid:  (Status:  Condition stable.  Not addressed this visit.)  Short Term Goal Evaluation of current treatment plan related to  right upper eyelid , self-management and patient's adherence to plan of care as established by provider  Reviewed PCP recommendations, per PCP patient/mother advised to apply warm compress, start Tobramycin ointment to apply to affected area bid, PCP recommended patient/mother contact her ophthalmologist if sx worsen Determined patient's mother the eye doctor instructed patient to continue to apply moist heat to her affected eye until the stye resolves Determined per patient's mother, the recommendations are working as evidence by patient's stye is smaller and seems to be resolving  Encouraged mother to alert PCP and or  Ophthalmologist of worsening condition  Discussed plans with patient for ongoing care management follow up and provided patient with direct contact information for care management team   Patient Goals/Self-Care Activities: Take all medications as prescribed Attend all scheduled provider appointments Call pharmacy for medication refills 3-7 days in advance of running out of medications Call provider office for new concerns or questions  keep a blood pressure log take blood pressure log to all doctor appointments call doctor for signs and symptoms of high blood pressure take medications for blood pressure exactly as prescribed report new symptoms to your doctor Try using peppermint essential oil and moist heat to your affected muscles for management of cramping  Add foods to your diet that contain tumeric to help with muscle health  Take your Magnesium as directed by Dr. Baird Cancer  Increase water intake to 48-64 oz daily unless otherwise directed   Follow Up Plan:  Telephone follow up appointment with care management team member scheduled for:  03/07/22     Barb Merino, RN, BSN, CCM Care Management Coordinator [place] [facility]  Direct Phone: [phone]

## 2022-01-07 NOTE — Patient Instructions (Addendum)
Visit Information  Thank you for taking time to visit with me today. Please don't hesitate to contact me if I can be of assistance to you before our next scheduled telephone appointment.  Following are the goals we discussed today:  Take all medications as prescribed Attend all scheduled provider appointments Call pharmacy for medication refills 3-7 days in advance of running out of medications Call provider office for new concerns or questions  keep a blood pressure log take blood pressure log to all doctor appointments call doctor for signs and symptoms of high blood pressure take medications for blood pressure exactly as prescribed report new symptoms to your doctor Try using peppermint essential oil and moist heat to your affected muscles for management of cramping  Add foods to your diet that contain tumeric to help with muscle health  Take your Magnesium as directed by Dr. Baird Cancer  Increase water intake to 48-64 oz daily unless otherwise directed   Our next appointment is by telephone on 03/07/22 at 12:00 PM   Please call the care guide team at [phone] if you need to cancel or reschedule your appointment.   If you are experiencing a Mental Health or [place] or need someone to talk to, please call 1-800-273-TALK (toll free, 24 hour hotline)   Patient verbalizes understanding of instructions and care plan provided today and agrees to view in [place]. Active MyChart status and patient understanding of how to access instructions and care plan via MyChart confirmed with patient.     Barb Merino, RN, BSN, CCM Care Management Coordinator [place] [facility]  Direct Phone: [phone]

## 2022-01-09 ENCOUNTER — Other Ambulatory Visit: Payer: Self-pay | Admitting: Internal Medicine

## 2022-01-10 DIAGNOSIS — R339 Retention of urine, unspecified: Secondary | ICD-10-CM | POA: Diagnosis not present

## 2022-02-21 DIAGNOSIS — R339 Retention of urine, unspecified: Secondary | ICD-10-CM | POA: Diagnosis not present

## 2022-03-03 ENCOUNTER — Ambulatory Visit (INDEPENDENT_AMBULATORY_CARE_PROVIDER_SITE_OTHER): Payer: Medicare Other | Admitting: Internal Medicine

## 2022-03-03 ENCOUNTER — Encounter: Payer: Self-pay | Admitting: Internal Medicine

## 2022-03-03 VITALS — BP 156/80 | HR 78 | Temp 98.3°F | Ht <= 58 in | Wt 107.6 lb

## 2022-03-03 DIAGNOSIS — Z6823 Body mass index (BMI) 23.0-23.9, adult: Secondary | ICD-10-CM | POA: Diagnosis not present

## 2022-03-03 DIAGNOSIS — I10 Essential (primary) hypertension: Secondary | ICD-10-CM

## 2022-03-03 DIAGNOSIS — Q059 Spina bifida, unspecified: Secondary | ICD-10-CM | POA: Diagnosis not present

## 2022-03-03 DIAGNOSIS — K5901 Slow transit constipation: Secondary | ICD-10-CM

## 2022-03-03 MED ORDER — LINACLOTIDE 72 MCG PO CAPS: 72.0000 ug | ORAL_CAPSULE | Freq: Every day | ORAL | 1 refills | Status: DC

## 2022-03-03 NOTE — Patient Instructions (Signed)
Hypertension, Adult ?Hypertension is another name for high blood pressure. High blood pressure forces your heart to work harder to pump blood. This can cause problems over time. ?There are two numbers in a blood pressure reading. There is a top number (systolic) over a bottom number (diastolic). It is best to have a blood pressure that is below 120/80. ?What are the causes? ?The cause of this condition is not known. Some other conditions can lead to high blood pressure. ?What increases the risk? ?Some lifestyle factors can make you more likely to develop high blood pressure: ?Smoking. ?Not getting enough exercise or physical activity. ?Being overweight. ?Having too much fat, sugar, calories, or salt (sodium) in your diet. ?Drinking too much alcohol. ?Other risk factors include: ?Having any of these conditions: ?Heart disease. ?Diabetes. ?High cholesterol. ?Kidney disease. ?Obstructive sleep apnea. ?Having a family history of high blood pressure and high cholesterol. ?Age. The risk increases with age. ?Stress. ?What are the signs or symptoms? ?High blood pressure may not cause symptoms. Very high blood pressure (hypertensive crisis) may cause: ?Headache. ?Fast or uneven heartbeats (palpitations). ?Shortness of breath. ?Nosebleed. ?Vomiting or feeling like you may vomit (nauseous). ?Changes in how you see. ?Very bad chest pain. ?Feeling dizzy. ?Seizures. ?How is this treated? ?This condition is treated by making healthy lifestyle changes, such as: ?Eating healthy foods. ?Exercising more. ?Drinking less alcohol. ?Your doctor may prescribe medicine if lifestyle changes do not help enough and if: ?Your top number is above 130. ?Your bottom number is above 80. ?Your personal target blood pressure may vary. ?Follow these instructions at home: ?Eating and drinking ? ?If told, follow the DASH eating plan. To follow this plan: ?Fill one half of your plate at each meal with fruits and vegetables. ?Fill one fourth of your plate  at each meal with whole grains. Whole grains include whole-wheat pasta, brown rice, and whole-grain bread. ?Eat or drink low-fat dairy products, such as skim milk or low-fat yogurt. ?Fill one fourth of your plate at each meal with low-fat (lean) proteins. Low-fat proteins include fish, chicken without skin, eggs, beans, and tofu. ?Avoid fatty meat, cured and processed meat, or chicken with skin. ?Avoid pre-made or processed food. ?Limit the amount of salt in your diet to less than 1,500 mg each day. ?Do not drink alcohol if: ?Your doctor tells you not to drink. ?You are pregnant, may be pregnant, or are planning to become pregnant. ?If you drink alcohol: ?Limit how much you have to: ?0-1 drink a day for women. ?0-2 drinks a day for men. ?Know how much alcohol is in your drink. In the [place] one drink equals one 12 oz bottle of beer (355 mL), one 5 oz glass of wine (148 mL), or one 1? oz glass of hard liquor (44 mL). ?Lifestyle ? ?Work with your doctor to stay at a healthy weight or to lose weight. Ask your doctor what the best weight is for you. ?Get at least 30 minutes of exercise that causes your heart to beat faster (aerobic exercise) most days of the week. This may include walking, swimming, or biking. ?Get at least 30 minutes of exercise that strengthens your muscles (resistance exercise) at least 3 days a week. This may include lifting weights or doing Pilates. ?Do not smoke or use any products that contain nicotine or tobacco. If you need help quitting, ask your doctor. ?Check your blood pressure at home as told by your doctor. ?Keep all follow-up visits. ?Medicines ?Take over-the-counter and prescription medicines   only as told by your doctor. Follow directions carefully. ?Do not skip doses of blood pressure medicine. The medicine does not work as well if you skip doses. Skipping doses also puts you at risk for problems. ?Ask your doctor about side effects or reactions to medicines that you should watch  for. ?Contact a doctor if: ?You think you are having a reaction to the medicine you are taking. ?You have headaches that keep coming back. ?You feel dizzy. ?You have swelling in your ankles. ?You have trouble with your vision. ?Get help right away if: ?You get a very bad headache. ?You start to feel mixed up (confused). ?You feel weak or numb. ?You feel faint. ?You have very bad pain in your: ?Chest. ?Belly (abdomen). ?You vomit more than once. ?You have trouble breathing. ?These symptoms may be an emergency. Get help right away. Call 911. ?Do not wait to see if the symptoms will go away. ?Do not drive yourself to the hospital. ?Summary ?Hypertension is another name for high blood pressure. ?High blood pressure forces your heart to work harder to pump blood. ?For most people, a normal blood pressure is less than 120/80. ?Making healthy choices can help lower blood pressure. If your blood pressure does not get lower with healthy choices, you may need to take medicine. ?This information is not intended to replace advice given to you by your health care provider. Make sure you discuss any questions you have with your health care provider. ?Document Revised: 04/15/2021 Document Reviewed: 04/15/2021 ?Elsevier Patient Education ? 2023 Elsevier Inc. ? ?

## 2022-03-03 NOTE — Progress Notes (Signed)
Rich Brave [place],acting as a Education administrator for Maximino Greenland, MD.,have documented all relevant documentation on the behalf of Maximino Greenland, MD,as directed by  Maximino Greenland, MD while in the presence of Maximino Greenland, MD.    Subjective:     Patient ID: Mary Neal , female    DOB: 10-31-1965 , 56 y.o.   MRN: [id number]   Chief Complaint  Patient presents with   Hypertension    HPI  She presents today for BP check. She reports compliance with meds. She is accompanied by her Mother today. She denies headaches, palpitations and shortness of breath. She states her bp was 118/67 at the center today.   Hypertension This is a chronic problem. The problem has been gradually improving since onset. The problem is controlled. Past treatments include angiotensin blockers. The current treatment provides moderate improvement.     Past Medical History:  Diagnosis Date   Abnormal Pap smear 2013   Dislocation of right hip ([place]) 06/09/2018   Per MRI   History of knee replacement 2005   Spina bifida [facility])    Uterine prolapse    Wears glasses      Family History  Problem Relation Age of Onset   Hypertension Mother    Hyperlipidemia Mother    Heart attack Brother      Current Outpatient Medications:    Digestive Enzymes (SUPER PAPAYA PLUS PO), Take 1 capsule by mouth daily at 2 am., Disp: , Rfl:    famotidine (PEPCID) 20 MG tablet, TAKE 1 TABLET BY MOUTH  DAILY, Disp: 100 tablet, Rfl: 2   Magnesium 300 MG CAPS, Take by mouth., Disp: , Rfl:    meloxicam (MOBIC) 7.5 MG tablet, Take 1 tablet by mouth x 5 days then one tab daily as needed, Disp: 30 tablet, Rfl: 1   oxybutynin (DITROPAN XL) 15 MG 24 hr tablet, TAKE 1 TABLET BY MOUTH  TWICE DAILY, Disp: 180 tablet, Rfl: 3   tobramycin-dexamethasone (TOBRADEX) ophthalmic ointment, Place 1 application into the right eye 3 (three) times daily., Disp: 3.5 g, Rfl: 0   UNABLE TO FIND, 1 capsule daily at 2 am. Med Name: enzyme plus, Disp: ,  Rfl:    valsartan (DIOVAN) 160 MG tablet, TAKE 1 TABLET BY MOUTH  DAILY, Disp: 90 tablet, Rfl: 3   vitamin C (ASCORBIC ACID) 500 MG tablet, Take by mouth., Disp: , Rfl:    Allergies  Allergen Reactions   Clindamycin/Lincomycin Palpitations   Latex Other (See Comments)   Penicillins Other (See Comments)     Review of Systems  Constitutional: Negative.   Eyes: Negative.   Respiratory: Negative.    Cardiovascular: Negative.   Neurological: Negative.   Psychiatric/Behavioral: Negative.       Today's Vitals   03/03/22 1136 03/03/22 1200  BP: (!) 158/60 (!) 156/80  Pulse: 78   Temp: 98.3 F (36.8 C)   Weight: 107 lb 9.6 oz (48.8 kg)   Height: '4\' 9"'$  (1.448 m)   PainSc: 0-No pain    Body mass index is 23.28 kg/m.  Wt Readings from Last 3 Encounters:  03/03/22 107 lb 9.6 oz (48.8 kg)  09/01/21 108 lb (49 kg)  07/07/21 95 lb (43.1 kg)    BP Readings from Last 3 Encounters:  03/03/22 (!) 156/80  01/05/22 (!) 150/92  09/01/21 136/74     Objective:  Physical Exam Vitals and nursing note reviewed.  Constitutional:      Appearance: Normal appearance.  HENT:  Head: Normocephalic and atraumatic.  Eyes:     Extraocular Movements: Extraocular movements intact.  Cardiovascular:     Rate and Rhythm: Normal rate and regular rhythm.     Heart sounds: Normal heart sounds.  Pulmonary:     Effort: Pulmonary effort is normal.     Breath sounds: Normal breath sounds.  Musculoskeletal:     Comments: Ambulatory with crutches  Skin:    General: Skin is warm.  Neurological:     General: No focal deficit present.     Mental Status: She is alert.  Psychiatric:        Mood and Affect: Mood normal.        Behavior: Behavior normal.         Assessment And Plan:     1. Essential hypertension, benign Comments: Chronic, uncontrolled.  She does not wish to add new meds to her current regimen.  I will have BP checked at the center 2-3x/week. If still elevated, I plan to add  another medication to her current regimen. Importance of salt restriction was d/w patient. She is in agreement with her tx plan. I hope to have BP readings in over the next two weeks and I will make medication adjustments at that time.  - Lipid panel  2. Slow transit constipation Comments: Chronic, she will c/w Linzess 2-3x/week.  3. Spina bifida without hydrocephalus, unspecified spinal region [facility]) Comments: Chronic.   4. BMI 23.0-23.9, adult Comments: She is encouraged to perform chair exercises while watching TV.    Patient was given opportunity to ask questions. Patient verbalized understanding of the plan and was able to repeat key elements of the plan. All questions were answered to their satisfaction.   I, Maximino Greenland, MD, have reviewed all documentation for this visit. The documentation on 03/03/22 for the exam, diagnosis, procedures, and orders are all accurate and complete.   IF YOU HAVE BEEN REFERRED TO A SPECIALIST, IT MAY TAKE 1-2 WEEKS TO SCHEDULE/PROCESS THE REFERRAL. IF YOU HAVE NOT HEARD FROM US/SPECIALIST IN TWO WEEKS, PLEASE GIVE [place] A CALL AT [phone] X [phone].   THE PATIENT IS ENCOURAGED TO PRACTICE SOCIAL DISTANCING DUE TO THE COVID-19 PANDEMIC.

## 2022-03-04 LAB — LIPID PANEL
Chol/HDL Ratio: 2.1 ratio (ref 0.0–4.4)
Cholesterol, Total: 184 mg/dL (ref 100–199)
HDL: 86 mg/dL (ref >39)
LDL Chol Calc (NIH): 89 mg/dL (ref 0–99)
Triglycerides: 45 mg/dL (ref 0–149)
VLDL Cholesterol Cal: 9 mg/dL (ref 5–40)

## 2022-03-07 ENCOUNTER — Ambulatory Visit: Payer: Self-pay

## 2022-03-07 DIAGNOSIS — K5900 Constipation, unspecified: Secondary | ICD-10-CM | POA: Diagnosis not present

## 2022-03-07 DIAGNOSIS — Q057 Lumbar spina bifida without hydrocephalus: Secondary | ICD-10-CM | POA: Diagnosis not present

## 2022-03-07 DIAGNOSIS — K592 Neurogenic bowel, not elsewhere classified: Secondary | ICD-10-CM | POA: Diagnosis not present

## 2022-03-07 NOTE — Patient Instructions (Signed)
Visit Information  Thank you for taking time to visit with me today. Please don't hesitate to contact me if I can be of assistance to you.   Following are the goals we discussed today:   Goals Addressed      I would like to keep my BP under good control       Care Coordination Interventions: Evaluation of current treatment plan related to hypertension self management and patient's adherence to plan as established by provider Advised patient, providing education and rationale, to monitor blood pressure daily and record, calling PCP for findings outside established parameters Provided education on prescribed diet low Sodium Educated mother [name] and patient regarding target BP <130/80 Instructed patient to monitor her BP 3 times weekly and record readings as recommended by PCP  Mailed printed educational materials related to the [place] practice recorded BP readings:  BP Readings from Last 3 Encounters:  03/03/22 (!) 156/80  01/05/22 (!) 150/92  09/01/21 136/74  Most recent eGFR/CrCl:  Lab Results  Component Value Date   EGFR 102 01/05/2022    No components found for: "CRCL"   Our next appointment is by telephone on 05/10/22 at 12:30 AM   Please call the care guide team at [phone] if you need to cancel or reschedule your appointment.   If you are experiencing a Mental Health or [place] or need someone to talk to, please call 1-800-273-TALK (toll free, 24 hour hotline)  Patient verbalizes understanding of instructions and care plan provided today and agrees to view in [place]. Active MyChart status and patient understanding of how to access instructions and care plan via MyChart confirmed with patient.     Barb Merino, RN, BSN, CCM Care Management Coordinator [facility] Care Management Direct Phone: [phone]

## 2022-03-07 NOTE — Patient Outreach (Signed)
  Care Coordination   Follow Up Visit Note   03/07/2022 Name: ODETTA Neal MRN: [id number] DOB: 12/03/1965  Mary Neal is a 56 y.o. year old female who sees Mary Chard, MD for primary care. I spoke with patient's [name] by phone today.  What matters to the patients health and wellness today?  Patient would like to monitor her blood pressure more closely to ensure she is meeting her goal BP.     Goals Addressed      I would like to keep my BP under good control       Care Coordination Interventions: Evaluation of current treatment plan related to hypertension self management and patient's adherence to plan as established by provider Advised patient, providing education and rationale, to monitor blood pressure daily and record, calling PCP for findings outside established parameters Provided education on prescribed diet low Sodium Educated [name] and patient regarding target BP <130/80 Instructed patient to monitor her BP 3 times weekly and record readings as recommended by PCP  Mailed printed educational materials related to the [place] [facility] recorded BP readings:  BP Readings from Last 3 Encounters:  03/03/22 (!) 156/80  01/05/22 (!) 150/92  09/01/21 136/74  Most recent eGFR/CrCl:  Lab Results  Component Value Date   EGFR 102 01/05/2022    No components found for: "CRCL"   SDOH assessments and interventions completed:  No     Care Coordination Interventions Activated:  Yes  Care Coordination Interventions:  Yes, provided   Follow up plan: Follow up call scheduled for 05/10/22 $RemoveBefo'@12'[id number]$ :30 PM    Encounter Outcome:  Pt. Visit Completed

## 2022-03-07 NOTE — Progress Notes (Signed)
This encounter was created in error - please disregard.

## 2022-03-24 DIAGNOSIS — R339 Retention of urine, unspecified: Secondary | ICD-10-CM | POA: Diagnosis not present

## 2022-04-19 ENCOUNTER — Other Ambulatory Visit: Payer: Self-pay | Admitting: Internal Medicine

## 2022-04-19 ENCOUNTER — Encounter: Payer: Self-pay | Admitting: Internal Medicine

## 2022-04-20 ENCOUNTER — Other Ambulatory Visit: Payer: Self-pay

## 2022-04-20 MED ORDER — AZITHROMYCIN 250 MG PO TABS: ORAL_TABLET | ORAL | 0 refills | Status: DC

## 2022-04-25 DIAGNOSIS — R339 Retention of urine, unspecified: Secondary | ICD-10-CM | POA: Diagnosis not present

## 2022-04-28 DIAGNOSIS — Z96643 Presence of artificial hip joint, bilateral: Secondary | ICD-10-CM | POA: Diagnosis not present

## 2022-04-28 DIAGNOSIS — Z471 Aftercare following joint replacement surgery: Secondary | ICD-10-CM | POA: Diagnosis not present

## 2022-04-28 DIAGNOSIS — Z96652 Presence of left artificial knee joint: Secondary | ICD-10-CM | POA: Diagnosis not present

## 2022-04-28 DIAGNOSIS — M85851 Other specified disorders of bone density and structure, right thigh: Secondary | ICD-10-CM | POA: Diagnosis not present

## 2022-04-28 DIAGNOSIS — Z96641 Presence of right artificial hip joint: Secondary | ICD-10-CM | POA: Diagnosis not present

## 2022-05-09 ENCOUNTER — Other Ambulatory Visit: Payer: Self-pay

## 2022-05-09 MED ORDER — AZITHROMYCIN 250 MG PO TABS: ORAL_TABLET | ORAL | 2 refills | Status: DC

## 2022-05-10 ENCOUNTER — Ambulatory Visit: Payer: Self-pay

## 2022-05-10 NOTE — Patient Outreach (Signed)
  Care Coordination   05/10/2022 Name: Mary Neal MRN: [id number] DOB: 1966/05/25   Care Coordination Outreach Attempts:  An unsuccessful telephone outreach was attempted for a scheduled appointment today.  Follow Up Plan:  Additional outreach attempts will be made to offer the patient care coordination information and services.   Encounter Outcome:  No Answer  Care Coordination Interventions Activated:  No   Care Coordination Interventions:  No, not indicated    Barb Merino, RN, BSN, CCM Care Management Coordinator [facility] Care Management Direct Phone: [phone]

## 2022-05-12 ENCOUNTER — Ambulatory Visit: Payer: Self-pay

## 2022-05-27 DIAGNOSIS — R339 Retention of urine, unspecified: Secondary | ICD-10-CM | POA: Diagnosis not present

## 2022-06-07 ENCOUNTER — Other Ambulatory Visit: Payer: Self-pay | Admitting: Internal Medicine

## 2022-06-08 ENCOUNTER — Telehealth: Payer: Self-pay | Admitting: *Deleted

## 2022-06-08 NOTE — Progress Notes (Signed)
  Care Coordination Note  06/08/2022 Name: Mary Neal MRN: [id number] DOB: Aug 12, 1965  Mary Neal is a 56 y.o. year old female who is a primary care patient of Glendale Chard, MD and is actively engaged with the care management team. I reached out to Mary Neal by phone today to assist with re-scheduling a follow up visit with the RN Case Manager  Follow up plan: Unsuccessful telephone outreach attempt made. A HIPAA compliant phone message was left for the patient providing contact information and requesting a return call.   [place]  Direct Dial: [phone]

## 2022-06-16 ENCOUNTER — Ambulatory Visit: Payer: Self-pay

## 2022-06-16 NOTE — Progress Notes (Signed)
  Care Coordination Note  06/16/2022 Name: Mary Neal MRN: [id number] DOB: 09-21-1965  Mary Neal is a 56 y.o. year old female who is a primary care patient of Glendale Chard, MD and is actively engaged with the care management team. I reached out to Mary Neal by phone today to assist with re-scheduling a follow up visit with the RN Case Manager  Follow up plan: Unsuccessful telephone outreach attempt made. A HIPAA compliant phone message was left for the patient providing contact information and requesting a return call.     [place]  Direct Dial: [phone]

## 2022-06-20 ENCOUNTER — Other Ambulatory Visit: Payer: Self-pay

## 2022-06-20 MED ORDER — AZITHROMYCIN 250 MG PO TABS: ORAL_TABLET | ORAL | 2 refills | Status: DC

## 2022-06-22 NOTE — Progress Notes (Signed)
  Care Coordination Note  06/22/2022 Name: Mary Neal MRN: [id number] DOB: 1965/10/05  Mary Neal is a 56 y.o. year old female who is a primary care patient of Glendale Chard, MD and is actively engaged with the care management team. I reached out to Mary Neal by phone today to assist with re-scheduling a follow up visit with the RN Case Manager  Follow up plan: Unsuccessful telephone outreach attempt made.   [place]  Direct Dial: [phone]

## 2022-07-08 ENCOUNTER — Other Ambulatory Visit: Payer: Self-pay | Admitting: Internal Medicine

## 2022-07-08 DIAGNOSIS — R339 Retention of urine, unspecified: Secondary | ICD-10-CM | POA: Diagnosis not present

## 2022-07-13 ENCOUNTER — Ambulatory Visit (INDEPENDENT_AMBULATORY_CARE_PROVIDER_SITE_OTHER): Payer: Medicare Other

## 2022-07-13 ENCOUNTER — Ambulatory Visit (INDEPENDENT_AMBULATORY_CARE_PROVIDER_SITE_OTHER): Payer: Medicare Other | Admitting: Internal Medicine

## 2022-07-13 ENCOUNTER — Encounter: Payer: Self-pay | Admitting: Internal Medicine

## 2022-07-13 VITALS — BP 122/62 | HR 82 | Temp 97.9°F | Ht <= 58 in | Wt 104.4 lb

## 2022-07-13 DIAGNOSIS — Z1211 Encounter for screening for malignant neoplasm of colon: Secondary | ICD-10-CM

## 2022-07-13 DIAGNOSIS — K5901 Slow transit constipation: Secondary | ICD-10-CM

## 2022-07-13 DIAGNOSIS — I10 Essential (primary) hypertension: Secondary | ICD-10-CM | POA: Diagnosis not present

## 2022-07-13 DIAGNOSIS — Q059 Spina bifida, unspecified: Secondary | ICD-10-CM

## 2022-07-13 DIAGNOSIS — G8220 Paraplegia, unspecified: Secondary | ICD-10-CM

## 2022-07-13 DIAGNOSIS — Z6823 Body mass index (BMI) 23.0-23.9, adult: Secondary | ICD-10-CM

## 2022-07-13 DIAGNOSIS — Z23 Encounter for immunization: Secondary | ICD-10-CM

## 2022-07-13 DIAGNOSIS — Z0000 Encounter for general adult medical examination without abnormal findings: Secondary | ICD-10-CM

## 2022-07-13 NOTE — Progress Notes (Signed)
Subjective:   Mary Neal is a 57 y.o. female who presents for Medicare Annual (Subsequent) preventive examination.  Review of Systems     Cardiac Risk Factors include: hypertension     Objective:    Today's Vitals   07/13/22 1043  BP: 122/62  Pulse: 82  Temp: 97.9 F (36.6 C)  TempSrc: Oral  SpO2: 96%  Weight: 104 lb 6.4 oz (47.4 kg)  Height: '4\' 9"'$  (1.448 m)   Body mass index is 22.59 kg/m.     07/13/2022   11:00 AM 07/07/2021   11:21 AM 07/01/2020   11:26 AM 06/27/2019   11:29 AM 01/11/2016    1:09 PM 10/14/2015    3:40 PM 08/07/2015   12:57 PM  Advanced Directives  Does Patient Have a Medical Advance Directive? Yes Yes No Yes No No No  Type of Paramedic of [place];Living will  [place];Living will     Copy of [place] in Chart? No - copy requested No - copy requested  No - copy requested     Would patient like information on creating a medical advance directive?     Yes - Scientist, clinical (histocompatibility and immunogenetics) given Yes - Scientist, clinical (histocompatibility and immunogenetics) given Yes - Scientist, clinical (histocompatibility and immunogenetics) given    Current Medications (verified) Outpatient Encounter Medications as of 07/13/2022  Medication Sig   Digestive Enzymes (SUPER PAPAYA PLUS PO) Take 1 capsule by mouth daily at 2 am.   famotidine (PEPCID) 20 MG tablet TAKE 1 TABLET BY MOUTH  DAILY   Magnesium 300 MG CAPS Take by mouth.   meloxicam (MOBIC) 7.5 MG tablet Take 1 tablet by mouth x 5 days then one tab daily as needed   oxybutynin (DITROPAN XL) 15 MG 24 hr tablet TAKE 1 TABLET BY MOUTH TWICE  DAILY   valsartan (DIOVAN) 160 MG tablet TAKE 1 TABLET BY MOUTH DAILY   vitamin C (ASCORBIC ACID) 500 MG tablet Take by mouth.   tobramycin-dexamethasone (TOBRADEX) ophthalmic ointment Place 1 application into the right eye 3 (three) times daily. (Patient not taking: Reported on 07/13/2022)   UNABLE TO FIND 1 capsule daily at 2 am. Med Name: enzyme plus   No  facility-administered encounter medications on file as of 07/13/2022.    Allergies (verified) Clindamycin/lincomycin, Latex, and Penicillins   History: Past Medical History:  Diagnosis Date   Abnormal Pap smear 2013   Dislocation of right hip ([place]) 06/09/2018   Per MRI   History of knee replacement 2005   Spina bifida [facility])    Uterine prolapse    Wears glasses    Past Surgical History:  Procedure Laterality Date   TOTAL HIP ARTHROPLASTY Right 12/2018   Family History  Problem Relation Age of Onset   Hypertension Mother    Hyperlipidemia Mother    Heart attack Brother    Social History   Socioeconomic History   Marital status: Single    Spouse name: Not on file   Number of children: 0   Years of education: Not on file   Highest education level: Not on file  Occupational History   Occupation: unemployed  Tobacco Use   Smoking status: Never   Smokeless tobacco: Never  Vaping Use   Vaping Use: Never used  Substance and Sexual Activity   Alcohol use: No    Alcohol/week: 0.0 standard drinks of alcohol   Drug use: No   Sexual activity: Never    Birth control/protection:  None  Other Topics Concern   Not on file  Social History Narrative   ** Merged History Encounter **       Social Determinants of Health   Financial Resource Strain: Low Risk  (07/13/2022)   Overall Financial Resource Strain (CARDIA)    Difficulty of Paying Living Expenses: Not hard at all  Food Insecurity: No Food Insecurity (07/13/2022)   Hunger Vital Sign    Worried About Running Out of Food in the Last Year: Never true    Ran Out of Food in the Last Year: Never true  Transportation Needs: No Transportation Needs (07/13/2022)   PRAPARE - Hydrologist (Medical): No    Lack of Transportation (Non-Medical): No  Physical Activity: Inactive (07/13/2022)   Exercise Vital Sign    Days of Exercise per Week: 0 days    Minutes of Exercise per Session: 0 min  Stress: No Stress  Concern Present (07/13/2022)   [place]    Feeling of Stress : Not at all  Social Connections: Moderately Isolated (02/20/2020)   Social Connection and Isolation Panel [NHANES]    Frequency of Communication with Friends and Family: More than three times a week    Frequency of Social Gatherings with Friends and Family: Never    Attends Religious Services: 1 to 4 times per year    Active Member of [employer] or Organizations: No    Attends Music therapist: Never    Marital Status: Never married    Tobacco Counseling Counseling given: Not Answered   Clinical Intake:  Pre-visit preparation completed: Yes  Pain : No/denies pain     Nutritional Status: BMI of 19-24  Normal Nutritional Risks: None Diabetes: No  How often do you need to have someone help you when you read instructions, pamphlets, or other written materials from your doctor or pharmacy?: 1 - Never  Diabetic? no  Interpreter Needed?: No  Information entered by :: NAllen LPN   Activities of Daily Living    07/13/2022   11:01 AM  In your present state of health, do you have any difficulty performing the following activities:  Hearing? 0  Vision? 0  Difficulty concentrating or making decisions? 0  Walking or climbing stairs? 1  Dressing or bathing? 1  Doing errands, shopping? 1  Preparing Food and eating ? N  Using the Toilet? N  In the past six months, have you accidently leaked urine? Y  Do you have problems with loss of bowel control? Y  Managing your Medications? N  Managing your Finances? N  Housekeeping or managing your Housekeeping? Y    Patient Care Team: Glendale Chard, MD as PCP - General (Internal Medicine) Rex Kras, Claudette Stapler, RN as Case Manager  Indicate any recent Medical Services you may have received from other than [facility] providers in the past year (date may be approximate).     Assessment:   This is a routine  wellness examination for [name].  Hearing/Vision screen Vision Screening - Comments:: Regular eye exams, [facility]  Dietary issues and exercise activities discussed: Current Exercise Habits: The patient does not participate in regular exercise at present   Goals Addressed             This Visit's Progress    Patient Stated       07/13/2022, wants to maintain weight       Depression Screen    07/13/2022  11:00 AM 07/07/2021   11:22 AM 08/12/2020    8:25 AM 07/01/2020   11:28 AM 01/06/2020    5:45 PM 06/27/2019   11:30 AM 04/15/2019   10:56 AM  PHQ 2/9 Scores  PHQ - 2 Score 0 0 0 0 0 0 0  PHQ- 9 Score   0   0     Fall Risk    07/13/2022   11:00 AM 07/07/2021   11:22 AM 07/01/2020   11:27 AM 01/06/2020    5:45 PM 06/27/2019   11:29 AM  Fall Risk   Falls in the past year? 1 0 1 0 1  Comment   slipped on the floor    Number falls in past yr: 1  0 0 1  Injury with Fall? 0  0 0 0  Risk for fall due to : Impaired balance/gait;Impaired mobility;Medication side effect Impaired balance/gait;Impaired mobility;Medication side effect History of fall(s);Impaired balance/gait;Impaired mobility;Medication side effect  History of fall(s);Medication side effect  Follow up Falls evaluation completed;Education provided;Falls prevention discussed Falls evaluation completed;Education provided;Falls prevention discussed Falls evaluation completed;Education provided;Falls prevention discussed  Falls evaluation completed;Education provided;Falls prevention discussed    FALL RISK PREVENTION PERTAINING TO THE HOME:  Any stairs in or around the home? No  If so, are there any without handrails? N/a Home free of loose throw rugs in walkways, pet beds, electrical cords, etc? Yes  Adequate lighting in your home to reduce risk of falls? Yes   ASSISTIVE DEVICES UTILIZED TO PREVENT FALLS:  Life alert? No  Use of a cane, walker or w/c? Yes  Grab bars in the bathroom? Yes  Shower chair or bench  in shower? Yes  Elevated toilet seat or a handicapped toilet? Yes   TIMED UP AND GO:  Was the test performed? Yes .  Length of time to ambulate 10 feet: 10 sec.   Gait slow and steady with assistive device  Cognitive Function:        07/13/2022   11:01 AM 07/07/2021   11:23 AM 07/01/2020   11:29 AM 06/27/2019   11:32 AM  6CIT Screen  What Year? 0 points 0 points 0 points 0 points  What month? 0 points 0 points 0 points 0 points  What time? 0 points 0 points 0 points 0 points  Count back from 20 4 points 0 points 4 points 0 points  Months in reverse 4 points 4 points 4 points 4 points  Repeat phrase 4 points 8 points 0 points 10 points  Total Score 12 points 12 points 8 points 14 points    Immunizations Immunization History  Administered Date(s) Administered   Influenza,inj,Quad PF,6+ Mos 06/19/2018, 04/15/2019, 04/08/2020, 05/16/2021, 07/13/2022   PFIZER(Purple Top)SARS-COV-2 Vaccination 09/24/2019, 10/15/2019, 07/13/2020   PNEUMOCOCCAL CONJUGATE-20 07/07/2021   Tdap 05/07/2019   Zoster Recombinat (Shingrix) 12/24/2020, 03/04/2021    TDAP status: Up to date  Flu Vaccine status: Up to date  Pneumococcal vaccine status: Up to date  Covid-19 vaccine status: Completed vaccines  Qualifies for Shingles Vaccine? Yes   Zostavax completed Yes   Shingrix Completed?: Yes  Screening Tests Health Maintenance  Topic Date Due   COVID-19 Vaccine (4 - 2023-24 season) 03/11/2022   Medicare Annual Wellness (AWV)  07/07/2022   HIV Screening  01/06/2023 (Originally 02/02/1980)   MAMMOGRAM  07/28/2022   PAP SMEAR-Modifier  08/11/2023   COLONOSCOPY (Pts 45-8yr Insurance coverage will need to be confirmed)  05/19/2025   DTaP/Tdap/Td (2 - Td or Tdap) 05/06/2029  INFLUENZA VACCINE  Completed   Hepatitis C Screening  Completed   Zoster Vaccines- Shingrix  Completed   HPV VACCINES  Aged Out    Health Maintenance  Health Maintenance Due  Topic Date Due   COVID-19 Vaccine (4  - 2023-24 season) 03/11/2022   Medicare Annual Wellness (AWV)  07/07/2022    Colorectal cancer screening: Type of screening: Colonoscopy. Completed 05/20/2015. Repeat every 10 years  Mammogram status: Completed 07/28/2021. Repeat every year  Bone Density status: n/a  Lung Cancer Screening: (Low Dose CT Chest recommended if Age 50-80 years, 30 pack-year currently smoking OR have quit w/in 15years.) does not qualify.   Lung Cancer Screening Referral: no  Additional Screening:  Hepatitis C Screening: does qualify; Completed 01/07/2020  Vision Screening: Recommended annual ophthalmology exams for early detection of glaucoma and other disorders of the eye. Is the patient up to date with their annual eye exam?  Yes  Who is the provider or what is the name of the office in which the patient attends annual eye exams? [facility] If pt is not established with a provider, would they like to be referred to a provider to establish care? No .   Dental Screening: Recommended annual dental exams for proper oral hygiene  Community Resource Referral / Chronic Care Management: CRR required this visit?  No   CCM required this visit?  No      Plan:     I have personally reviewed and noted the following in the patient's chart:   Medical and social history Use of alcohol, tobacco or illicit drugs  Current medications and supplements including opioid prescriptions. Patient is not currently taking opioid prescriptions. Functional ability and status Nutritional status Physical activity Advanced directives List of other physicians Hospitalizations, surgeries, and ER visits in previous 12 months Vitals Screenings to include cognitive, depression, and falls Referrals and appointments  In addition, I have reviewed and discussed with patient certain preventive protocols, quality metrics, and best practice recommendations. A written personalized care plan for preventive services as well as general  preventive health recommendations were provided to patient.     Kellie Simmering, LPN   07/19/6220   Nurse Notes: none

## 2022-07-13 NOTE — Patient Instructions (Signed)
Hypertension, Adult ?Hypertension is another name for high blood pressure. High blood pressure forces your heart to work harder to pump blood. This can cause problems over time. ?There are two numbers in a blood pressure reading. There is a top number (systolic) over a bottom number (diastolic). It is best to have a blood pressure that is below 120/80. ?What are the causes? ?The cause of this condition is not known. Some other conditions can lead to high blood pressure. ?What increases the risk? ?Some lifestyle factors can make you more likely to develop high blood pressure: ?Smoking. ?Not getting enough exercise or physical activity. ?Being overweight. ?Having too much fat, sugar, calories, or salt (sodium) in your diet. ?Drinking too much alcohol. ?Other risk factors include: ?Having any of these conditions: ?Heart disease. ?Diabetes. ?High cholesterol. ?Kidney disease. ?Obstructive sleep apnea. ?Having a family history of high blood pressure and high cholesterol. ?Age. The risk increases with age. ?Stress. ?What are the signs or symptoms? ?High blood pressure may not cause symptoms. Very high blood pressure (hypertensive crisis) may cause: ?Headache. ?Fast or uneven heartbeats (palpitations). ?Shortness of breath. ?Nosebleed. ?Vomiting or feeling like you may vomit (nauseous). ?Changes in how you see. ?Very bad chest pain. ?Feeling dizzy. ?Seizures. ?How is this treated? ?This condition is treated by making healthy lifestyle changes, such as: ?Eating healthy foods. ?Exercising more. ?Drinking less alcohol. ?Your doctor may prescribe medicine if lifestyle changes do not help enough and if: ?Your top number is above 130. ?Your bottom number is above 80. ?Your personal target blood pressure may vary. ?Follow these instructions at home: ?Eating and drinking ? ?If told, follow the DASH eating plan. To follow this plan: ?Fill one half of your plate at each meal with fruits and vegetables. ?Fill one fourth of your plate  at each meal with whole grains. Whole grains include whole-wheat pasta, brown rice, and whole-grain bread. ?Eat or drink low-fat dairy products, such as skim milk or low-fat yogurt. ?Fill one fourth of your plate at each meal with low-fat (lean) proteins. Low-fat proteins include fish, chicken without skin, eggs, beans, and tofu. ?Avoid fatty meat, cured and processed meat, or chicken with skin. ?Avoid pre-made or processed food. ?Limit the amount of salt in your diet to less than 1,500 mg each day. ?Do not drink alcohol if: ?Your doctor tells you not to drink. ?You are pregnant, may be pregnant, or are planning to become pregnant. ?If you drink alcohol: ?Limit how much you have to: ?0-1 drink a day for women. ?0-2 drinks a day for men. ?Know how much alcohol is in your drink. In the [place] one drink equals one 12 oz bottle of beer (355 mL), one 5 oz glass of wine (148 mL), or one 1? oz glass of hard liquor (44 mL). ?Lifestyle ? ?Work with your doctor to stay at a healthy weight or to lose weight. Ask your doctor what the best weight is for you. ?Get at least 30 minutes of exercise that causes your heart to beat faster (aerobic exercise) most days of the week. This may include walking, swimming, or biking. ?Get at least 30 minutes of exercise that strengthens your muscles (resistance exercise) at least 3 days a week. This may include lifting weights or doing Pilates. ?Do not smoke or use any products that contain nicotine or tobacco. If you need help quitting, ask your doctor. ?Check your blood pressure at home as told by your doctor. ?Keep all follow-up visits. ?Medicines ?Take over-the-counter and prescription medicines   only as told by your doctor. Follow directions carefully. ?Do not skip doses of blood pressure medicine. The medicine does not work as well if you skip doses. Skipping doses also puts you at risk for problems. ?Ask your doctor about side effects or reactions to medicines that you should watch  for. ?Contact a doctor if: ?You think you are having a reaction to the medicine you are taking. ?You have headaches that keep coming back. ?You feel dizzy. ?You have swelling in your ankles. ?You have trouble with your vision. ?Get help right away if: ?You get a very bad headache. ?You start to feel mixed up (confused). ?You feel weak or numb. ?You feel faint. ?You have very bad pain in your: ?Chest. ?Belly (abdomen). ?You vomit more than once. ?You have trouble breathing. ?These symptoms may be an emergency. Get help right away. Call 911. ?Do not wait to see if the symptoms will go away. ?Do not drive yourself to the hospital. ?Summary ?Hypertension is another name for high blood pressure. ?High blood pressure forces your heart to work harder to pump blood. ?For most people, a normal blood pressure is less than 120/80. ?Making healthy choices can help lower blood pressure. If your blood pressure does not get lower with healthy choices, you may need to take medicine. ?This information is not intended to replace advice given to you by your health care provider. Make sure you discuss any questions you have with your health care provider. ?Document Revised: 04/15/2021 Document Reviewed: 04/15/2021 ?Elsevier Patient Education ? 2023 Elsevier Inc. ? ?

## 2022-07-13 NOTE — Patient Instructions (Signed)
Mary Neal , Thank you for taking time to come for your Medicare Wellness Visit. I appreciate your ongoing commitment to your health goals. Please review the following plan we discussed and let me know if I can assist you in the future.   These are the goals we discussed:  Goals      I would like to keep my BP under good control     Care Coordination Interventions: Evaluation of current treatment plan related to hypertension self management and patient's adherence to plan as established by provider Advised patient, providing education and rationale, to monitor blood pressure daily and record, calling PCP for findings outside established parameters Provided education on prescribed diet low Sodium Educated [name] and patient regarding target BP <130/80 Instructed patient to monitor her BP 3 times weekly and record readings as recommended by PCP  Mailed printed educational materials related to the [facility] practice recorded BP readings:  BP Readings from Last 3 Encounters:  03/03/22 (!) 156/80  01/05/22 (!) 150/92  09/01/21 136/74  Most recent eGFR/CrCl:  Lab Results  Component Value Date   EGFR 102 01/05/2022    No components found for: "CRCL"          Patient Stated     07/01/2020, wants to stay small     Patient Stated     07/07/2021, no goals     Patient Stated     07/13/2022, wants to maintain weight     Weight (lb) < 200 lb (90.7 kg)     06/27/2019, wants to weigh 105 pounds        This is a list of the screening recommended for you and due dates:  Health Maintenance  Topic Date Due   COVID-19 Vaccine (4 - 2023-24 season) 03/11/2022   HIV Screening  01/06/2023*   Mammogram  07/28/2022   Medicare Annual Wellness Visit  07/14/2023   Pap Smear  08/11/2023   Colon Cancer Screening  05/19/2025   DTaP/Tdap/Td vaccine (2 - Td or Tdap) 05/06/2029   Flu Shot  Completed   Hepatitis C Screening: USPSTF Recommendation to screen - Ages 18-79 yo.  Completed    Zoster (Shingles) Vaccine  Completed   HPV Vaccine  Aged Out  *Topic was postponed. The date shown is not the original due date.    Advanced directives: Please bring a copy of your POA (Power of Attorney) and/or Living Will to your next appointment.   Conditions/risks identified: none  Next appointment: Follow up in one year for your annual wellness visit.   Preventive Care 40-64 Years, Female Preventive care refers to lifestyle choices and visits with your health care provider that can promote health and wellness. What does preventive care include? A yearly physical exam. This is also called an annual well check. Dental exams once or twice a year. Routine eye exams. Ask your health care provider how often you should have your eyes checked. Personal lifestyle choices, including: Daily care of your teeth and gums. Regular physical activity. Eating a healthy diet. Avoiding tobacco and drug use. Limiting alcohol use. Practicing safe sex. Taking low-dose aspirin daily starting at age 65. Taking vitamin and mineral supplements as recommended by your health care provider. What happens during an annual well check? The services and screenings done by your health care provider during your annual well check will depend on your age, overall health, lifestyle risk factors, and family history of disease. Counseling  Your health care provider may ask you  questions about your: Alcohol use. Tobacco use. Drug use. Emotional well-being. Home and relationship well-being. Sexual activity. Eating habits. Work and work Statistician. Method of birth control. Menstrual cycle. Pregnancy history. Screening  You may have the following tests or measurements: Height, weight, and BMI. Blood pressure. Lipid and cholesterol levels. These may be checked every 5 years, or more frequently if you are over 32 years old. Skin check. Lung cancer screening. You may have this screening every year starting at  age 54 if you have a 30-pack-year history of smoking and currently smoke or have quit within the past 15 years. Fecal occult blood test (FOBT) of the stool. You may have this test every year starting at age 25. Flexible sigmoidoscopy or colonoscopy. You may have a sigmoidoscopy every 5 years or a colonoscopy every 10 years starting at age 32. Hepatitis C blood test. Hepatitis B blood test. Sexually transmitted disease (STD) testing. Diabetes screening. This is done by checking your blood sugar (glucose) after you have not eaten for a while (fasting). You may have this done every 1-3 years. Mammogram. This may be done every 1-2 years. Talk to your health care provider about when you should start having regular mammograms. This may depend on whether you have a family history of breast cancer. BRCA-related cancer screening. This may be done if you have a family history of breast, ovarian, tubal, or peritoneal cancers. Pelvic exam and Pap test. This may be done every 3 years starting at age 62. Starting at age 28, this may be done every 5 years if you have a Pap test in combination with an HPV test. Bone density scan. This is done to screen for osteoporosis. You may have this scan if you are at high risk for osteoporosis. Discuss your test results, treatment options, and if necessary, the need for more tests with your health care provider. Vaccines  Your health care provider may recommend certain vaccines, such as: Influenza vaccine. This is recommended every year. Tetanus, diphtheria, and acellular pertussis (Tdap, Td) vaccine. You may need a Td booster every 10 years. Zoster vaccine. You may need this after age 78. Pneumococcal 13-valent conjugate (PCV13) vaccine. You may need this if you have certain conditions and were not previously vaccinated. Pneumococcal polysaccharide (PPSV23) vaccine. You may need one or two doses if you smoke cigarettes or if you have certain conditions. Talk to your health  care provider about which screenings and vaccines you need and how often you need them. This information is not intended to replace advice given to you by your health care provider. Make sure you discuss any questions you have with your health care provider. Document Released: 07/24/2015 Document Revised: 03/16/2016 Document Reviewed: 04/28/2015 Elsevier Interactive Patient Education  2017 [place] Prevention in the Home Falls can cause injuries. They can happen to people of all ages. There are many things you can do to make your home safe and to help prevent falls. What can I do on the outside of my home? Regularly fix the edges of walkways and driveways and fix any cracks. Remove anything that might make you trip as you walk through a door, such as a raised step or threshold. Trim any bushes or trees on the path to your home. Use bright outdoor lighting. Clear any walking paths of anything that might make someone trip, such as rocks or tools. Regularly check to see if handrails are loose or broken. Make sure that both sides of any  steps have handrails. Any raised decks and porches should have guardrails on the edges. Have any leaves, snow, or ice cleared regularly. Use sand or salt on walking paths during winter. Clean up any spills in your garage right away. This includes oil or grease spills. What can I do in the bathroom? Use night lights. Install grab bars by the toilet and in the tub and shower. Do not use towel bars as grab bars. Use non-skid mats or decals in the tub or shower. If you need to sit down in the shower, use a plastic, non-slip stool. Keep the floor dry. Clean up any water that spills on the floor as soon as it happens. Remove soap buildup in the tub or shower regularly. Attach bath mats securely with double-sided non-slip rug tape. Do not have throw rugs and other things on the floor that can make you trip. What can I do in the bedroom? Use night  lights. Make sure that you have a light by your bed that is easy to reach. Do not use any sheets or blankets that are too big for your bed. They should not hang down onto the floor. Have a firm chair that has side arms. You can use this for support while you get dressed. Do not have throw rugs and other things on the floor that can make you trip. What can I do in the kitchen? Clean up any spills right away. Avoid walking on wet floors. Keep items that you use a lot in easy-to-reach places. If you need to reach something above you, use a strong step stool that has a grab bar. Keep electrical cords out of the way. Do not use floor polish or wax that makes floors slippery. If you must use wax, use non-skid floor wax. Do not have throw rugs and other things on the floor that can make you trip. What can I do with my stairs? Do not leave any items on the stairs. Make sure that there are handrails on both sides of the stairs and use them. Fix handrails that are broken or loose. Make sure that handrails are as long as the stairways. Check any carpeting to make sure that it is firmly attached to the stairs. Fix any carpet that is loose or worn. Avoid having throw rugs at the top or bottom of the stairs. If you do have throw rugs, attach them to the floor with carpet tape. Make sure that you have a light switch at the top of the stairs and the bottom of the stairs. If you do not have them, ask someone to add them for you. What else can I do to help prevent falls? Wear shoes that: Do not have high heels. Have rubber bottoms. Are comfortable and fit you well. Are closed at the toe. Do not wear sandals. If you use a stepladder: Make sure that it is fully opened. Do not climb a closed stepladder. Make sure that both sides of the stepladder are locked into place. Ask someone to hold it for you, if possible. Clearly mark and make sure that you can see: Any grab bars or handrails. First and last  steps. Where the edge of each step is. Use tools that help you move around (mobility aids) if they are needed. These include: Canes. Walkers. Scooters. Crutches. Turn on the lights when you go into a dark area. Replace any light bulbs as soon as they burn out. Set up your furniture so you have a clear  path. Avoid moving your furniture around. If any of your floors are uneven, fix them. If there are any pets around you, be aware of where they are. Review your medicines with your doctor. Some medicines can make you feel dizzy. This can increase your chance of falling. Ask your doctor what other things that you can do to help prevent falls. This information is not intended to replace advice given to you by your health care provider. Make sure you discuss any questions you have with your health care provider. Document Released: 04/23/2009 Document Revised: 12/03/2015 Document Reviewed: 08/01/2014 Elsevier Interactive Patient Education  2017 [employer].

## 2022-07-13 NOTE — Progress Notes (Signed)
I,Victoria T Hamilton,acting as a scribe for Maximino Greenland, MD.,have documented all relevant documentation on the behalf of Maximino Greenland, MD,as directed by  Maximino Greenland, MD while in the presence of Maximino Greenland, MD.    Subjective:     Patient ID: Mary Neal , female    DOB: [birth year] , 57 y.o.   MRN: [id number]   Chief Complaint  Patient presents with   Hypertension    HPI  She presents today for BP check. She reports compliance with meds. She is accompanied by her Mother today. She denies headaches, palpitations and shortness of breath. She states her bp was 118/67 at the center today.    She is also scheduled for AWV with [facility] Advisor today.   Hypertension This is a chronic problem. The problem has been gradually improving since onset. The problem is controlled. Past treatments include angiotensin blockers. The current treatment provides moderate improvement.     Past Medical History:  Diagnosis Date   Abnormal Pap smear 2013   Dislocation of right hip ([place]) 06/09/2018   Per MRI   History of knee replacement 2005   Spina bifida [facility])    Uterine prolapse    Wears glasses      Family History  Problem Relation Age of Onset   Hypertension Mother    Hyperlipidemia Mother    Heart attack Brother      Current Outpatient Medications:    Digestive Enzymes (SUPER PAPAYA PLUS PO), Take 1 capsule by mouth daily at 2 am., Disp: , Rfl:    famotidine (PEPCID) 20 MG tablet, TAKE 1 TABLET BY MOUTH  DAILY, Disp: 100 tablet, Rfl: 2   Magnesium 300 MG CAPS, Take by mouth., Disp: , Rfl:    meloxicam (MOBIC) 7.5 MG tablet, Take 1 tablet by mouth x 5 days then one tab daily as needed, Disp: 30 tablet, Rfl: 1   oxybutynin (DITROPAN XL) 15 MG 24 hr tablet, TAKE 1 TABLET BY MOUTH TWICE  DAILY, Disp: 200 tablet, Rfl: 2   tobramycin-dexamethasone (TOBRADEX) ophthalmic ointment, Place 1 application into the right eye 3 (three) times daily. (Patient not taking: Reported on  07/13/2022), Disp: 3.5 g, Rfl: 0   UNABLE TO FIND, 1 capsule daily at 2 am. Med Name: enzyme plus, Disp: , Rfl:    valsartan (DIOVAN) 160 MG tablet, TAKE 1 TABLET BY MOUTH DAILY, Disp: 100 tablet, Rfl: 2   vitamin C (ASCORBIC ACID) 500 MG tablet, Take by mouth., Disp: , Rfl:    Allergies  Allergen Reactions   Clindamycin/Lincomycin Palpitations   Latex Other (See Comments)   Penicillins Other (See Comments)     Review of Systems  Constitutional: Negative.   Respiratory: Negative.    Cardiovascular: Negative.   Neurological: Negative.   Psychiatric/Behavioral: Negative.       Today's Vitals   07/13/22 1125  BP: 122/62  Pulse: 82  Temp: 97.9 F (36.6 C)  TempSrc: Oral  Weight: 104 lb 6.4 oz (47.4 kg)  Height: '4\' 9"'$  (1.448 m)   Body mass index is 22.59 kg/m.   Objective:  Physical Exam Vitals and nursing note reviewed.  Constitutional:      Appearance: Normal appearance.  HENT:     Head: Normocephalic and atraumatic.     Nose:     Comments: Masked     Mouth/Throat:     Comments: Masked  Eyes:     Extraocular Movements: Extraocular movements intact.  Cardiovascular:  Rate and Rhythm: Normal rate and regular rhythm.     Heart sounds: Normal heart sounds.  Pulmonary:     Effort: Pulmonary effort is normal.     Breath sounds: Normal breath sounds.  Musculoskeletal:     Cervical back: Normal range of motion.     Comments: Ambulatory with crutches  Skin:    General: Skin is warm.  Neurological:     General: No focal deficit present.     Mental Status: She is alert.  Psychiatric:        Mood and Affect: Mood normal.        Behavior: Behavior normal.      Assessment And Plan:     1. Essential hypertension, benign Comments: Chronic, well controlled. No med changes. She will c/w valsartan '160mg'$  daily. She will f/u in 4 months for re-evaluation. - CMP14+EGFR - CBC - Lipid panel - Insulin, random(561)  2. Spina bifida without hydrocephalus, unspecified  spinal region [facility]) Comments: Chronic. condition is stable.  3. Slow transit constipation Comments: Chronic, she is encouraged to stay well hydrated and to increase fiber in her diet.  4. Paraparesis ([place]) Comments: Chronic. She is ambulatory wiith crutches.  5. BMI 23.0-23.9, adult Comments: She reports having a great appetite. Her weight is stable.  6. Screen for colon cancer Comments: She does not wish to have colonoscopy, she does agree to Cologuard testing. - Cologuard   Patient was given opportunity to ask questions. Patient verbalized understanding of the plan and was able to repeat key elements of the plan. All questions were answered to their satisfaction.   I, Maximino Greenland, MD, have reviewed all documentation for this visit. The documentation on 07/13/22 for the exam, diagnosis, procedures, and orders are all accurate and complete.   IF YOU HAVE BEEN REFERRED TO A SPECIALIST, IT MAY TAKE 1-2 WEEKS TO SCHEDULE/PROCESS THE REFERRAL. IF YOU HAVE NOT HEARD FROM US/SPECIALIST IN TWO WEEKS, PLEASE GIVE [place] A CALL AT [phone].   THE PATIENT IS ENCOURAGED TO PRACTICE SOCIAL DISTANCING DUE TO THE COVID-19 PANDEMIC.

## 2022-07-14 ENCOUNTER — Ambulatory Visit: Payer: Medicare Other | Admitting: Internal Medicine

## 2022-07-14 ENCOUNTER — Ambulatory Visit: Payer: Medicare Other

## 2022-07-14 LAB — CMP14+EGFR
ALT: 24 IU/L (ref 0–32)
AST: 24 IU/L (ref 0–40)
Albumin/Globulin Ratio: 1.8 (ref 1.2–2.2)
Albumin: 4 g/dL (ref 3.8–4.9)
Alkaline Phosphatase: 71 IU/L (ref 44–121)
BUN/Creatinine Ratio: 27 — ABNORMAL HIGH (ref 9–23)
BUN: 17 mg/dL (ref 6–24)
Bilirubin Total: 0.5 mg/dL (ref 0.0–1.2)
CO2: 24 mmol/L (ref 20–29)
Calcium: 9.6 mg/dL (ref 8.7–10.2)
Chloride: 102 mmol/L (ref 96–106)
Creatinine, Ser: 0.64 mg/dL (ref 0.57–1.00)
Globulin, Total: 2.2 g/dL (ref 1.5–4.5)
Glucose: 70 mg/dL (ref 70–99)
Potassium: 3.9 mmol/L (ref 3.5–5.2)
Sodium: 139 mmol/L (ref 134–144)
Total Protein: 6.2 g/dL (ref 6.0–8.5)
eGFR: 103 mL/min/{1.73_m2} (ref >59)

## 2022-07-14 LAB — LIPID PANEL
Chol/HDL Ratio: 1.8 ratio (ref 0.0–4.4)
Cholesterol, Total: 192 mg/dL (ref 100–199)
HDL: 105 mg/dL (ref >39)
LDL Chol Calc (NIH): 80 mg/dL (ref 0–99)
Triglycerides: 35 mg/dL (ref 0–149)
VLDL Cholesterol Cal: 7 mg/dL (ref 5–40)

## 2022-07-14 LAB — INSULIN, RANDOM: INSULIN: 3.8 u[IU]/mL (ref 2.6–24.9)

## 2022-07-14 LAB — CBC
Hematocrit: 37.4 % (ref 34.0–46.6)
Hemoglobin: 12.8 g/dL (ref 11.1–15.9)
MCH: 31.8 pg (ref 26.6–33.0)
MCHC: 34.2 g/dL (ref 31.5–35.7)
MCV: 93 fL (ref 79–97)
Platelets: 147 10*3/uL — ABNORMAL LOW (ref 150–450)
RBC: 4.02 x10E6/uL (ref 3.77–5.28)
RDW: 11.9 % (ref 11.7–15.4)
WBC: 5.5 10*3/uL (ref 3.4–10.8)

## 2022-07-20 ENCOUNTER — Telehealth: Payer: Self-pay | Admitting: *Deleted

## 2022-07-20 NOTE — Progress Notes (Signed)
  Care Coordination Note  07/20/2022 Name: [name] MRN: [id number] DOB: 1965/12/16  Mary Neal is a 57 y.o. year old female who is a primary care patient of Glendale Chard, MD and is actively engaged with the care management team. I reached out to Mary Neal by phone today to assist with re-scheduling a follow up visit with the RN Case Manager  Follow up plan: Telephone appointment with care management team member scheduled for:08/05/22  [place]: [phone]

## 2022-07-27 DIAGNOSIS — R339 Retention of urine, unspecified: Secondary | ICD-10-CM | POA: Diagnosis not present

## 2022-07-27 DIAGNOSIS — N1339 Other hydronephrosis: Secondary | ICD-10-CM | POA: Diagnosis not present

## 2022-08-03 DIAGNOSIS — Z1231 Encounter for screening mammogram for malignant neoplasm of breast: Secondary | ICD-10-CM | POA: Diagnosis not present

## 2022-08-03 LAB — HM MAMMOGRAPHY

## 2022-08-05 ENCOUNTER — Ambulatory Visit: Payer: Self-pay

## 2022-08-05 NOTE — Patient Outreach (Signed)
  Care Coordination   Follow Up Visit Note   08/05/2022 Name: Mary Neal MRN: [id number] DOB: [birth year]  Mary Neal is a 57 y.o. year old female who sees Glendale Chard, MD for primary care. I spoke with [name] by phone today.  What matters to the patients health and wellness today?  Patient will provide stool sample for colorguard test. Patient continues to take Magnesium as prescribed for muscle cramps.    Goals Addressed             This Visit's Progress    I would like to keep my BP under good control       Care Coordination Interventions: Evaluation of current treatment plan related to hypertension self management and patient's adherence to plan as established by provider Advised patient, providing education and rationale, to monitor blood pressure daily and record, calling PCP for findings outside established parameters Review of patient status, including review of consultant's reports, relevant laboratory and other test results BP Readings from Last 3 Encounters:  07/13/22 122/62  07/13/22 122/62  03/03/22 (!) 156/80   Most recent eGFR/CrCl:  Lab Results  Component Value Date   EGFR 103 07/13/2022    No components found for: "CRCL"      To collect a cologaurd sample       Care Coordination Interventions: Evaluation of current treatment plan related to screen for colon cancer and patient's adherence to plan as established by provider Educated [name] regarding how to collect stool sample for patient's cologuard and how to ship sample         To have less muscle cramping       Care Coordination Interventions: Evaluation of current treatment plan related to muscle cramps and patient's adherence to plan as established by provider Discussed with mother patient continues to have muscle cramps in her hands, abdomen, and hip Reviewed medications with patient and discussed importance of medication adherence Added message to PCP next  f/u visit requesting Vitamin d check            SDOH assessments and interventions completed:  No     Care Coordination Interventions:  Yes, provided   Follow up plan: Follow up call scheduled for 01/06/23 '@1'$  PM    Encounter Outcome:  Pt. Visit Completed

## 2022-08-05 NOTE — Patient Instructions (Signed)
Visit Information  Thank you for taking time to visit with me today. Please don't hesitate to contact me if I can be of assistance to you.   Following are the goals we discussed today:   Goals Addressed             This Visit's Progress    I would like to keep my BP under good control       Care Coordination Interventions: Evaluation of current treatment plan related to hypertension self management and patient's adherence to plan as established by provider Advised patient, providing education and rationale, to monitor blood pressure daily and record, calling PCP for findings outside established parameters Review of patient status, including review of consultant's reports, relevant laboratory and other test results BP Readings from Last 3 Encounters:  07/13/22 122/62  07/13/22 122/62  03/03/22 (!) 156/80   Most recent eGFR/CrCl:  Lab Results  Component Value Date   EGFR 103 07/13/2022    No components found for: "CRCL"      To collect a cologaurd sample       Care Coordination Interventions: Evaluation of current treatment plan related to screen for colon cancer and patient's adherence to plan as established by provider Educated mother [name] regarding how to collect stool sample for patient's cologuard and how to ship sample         To have less muscle cramping       Care Coordination Interventions: Evaluation of current treatment plan related to muscle cramps and patient's adherence to plan as established by provider Discussed with mother patient continues to have muscle cramps in her hands, abdomen, and hip Reviewed medications with patient and discussed importance of medication adherence Added message to PCP next f/u visit requesting Vitamin d check            Our next appointment is by telephone on 01/06/23 at 1PM   Please call the care guide team at [phone] if you need to cancel or reschedule your appointment.   If you are experiencing a Mental Health  or [place] or need someone to talk to, please go to [facility] [address] [place] [phone])  Patient verbalizes understanding of instructions and care plan provided today and agrees to view in [place]. Active MyChart status and patient understanding of how to access instructions and care plan via MyChart confirmed with patient.     Barb Merino, RN, BSN, CCM Care Management Coordinator [place]  [facility] Care Management  Direct Phone: [phone]

## 2022-08-09 DIAGNOSIS — R339 Retention of urine, unspecified: Secondary | ICD-10-CM | POA: Diagnosis not present

## 2022-08-22 ENCOUNTER — Encounter: Payer: Self-pay | Admitting: Internal Medicine

## 2022-08-22 ENCOUNTER — Ambulatory Visit (INDEPENDENT_AMBULATORY_CARE_PROVIDER_SITE_OTHER): Payer: 59 | Admitting: Internal Medicine

## 2022-08-22 VITALS — BP 128/78 | Temp 98.0°F | Ht <= 58 in | Wt 108.0 lb

## 2022-08-22 DIAGNOSIS — K592 Neurogenic bowel, not elsewhere classified: Secondary | ICD-10-CM | POA: Diagnosis not present

## 2022-08-22 DIAGNOSIS — R252 Cramp and spasm: Secondary | ICD-10-CM

## 2022-08-22 DIAGNOSIS — I10 Essential (primary) hypertension: Secondary | ICD-10-CM | POA: Diagnosis not present

## 2022-08-22 NOTE — Patient Instructions (Addendum)
[name] - use once daily  Hypokalemia Hypokalemia means that the amount of potassium in the blood is lower than normal. Potassium is a mineral (electrolyte) that helps regulate the amount of fluid in the body. It also stimulates muscle tightening (contraction) and helps nerves work properly. Normally, most of the body's potassium is inside cells, and only a very small amount is in the blood. Because the amount in the blood is so small, minor changes to potassium levels in the blood can be life-threatening. What are the causes?  If told, eat more foods that contain a lot of potassium. These include: Nuts, such as peanuts and pistachios. Seeds, such as sunflower seeds and pumpkin seeds. Peas, lentils, and lima beans. Whole grain and bran cereals and breads. Fresh fruits and vegetables, such as apricots, avocado, bananas, cantaloupe, kiwi, oranges, tomatoes, asparagus, and potatoes. Juices, such as orange, tomato, and prune. Lean meats, including fish. Milk and milk products, such as yogurt. General instructions Take over-the-counter and prescription medicines only as told by your health care provider. This includes vitamins, natural food products, and supplements. Keep all follow-up visits. This is important. Contact a health care provider if: You have weakness that gets worse. You feel your heart pounding or racing. You vomit. You have diarrhea. You have diabetes and you have trouble keeping your blood sugar in your target range. Get help right away if: You have chest pain. You have shortness of breath. You have vomiting or diarrhea that lasts for more than 2 days. You faint. These symptoms may be an emergency. Get help right away. Call 911. Do not wait to see if the symptoms will go away. Do not drive yourself to the hospital. Summary Hypokalemia means that the amount of potassium in the blood is lower than normal. This condition is diagnosed with a blood test. Hypokalemia may  be treated by taking potassium supplements, adjusting the medicines that you take, or eating more foods that are high in potassium. If your potassium level is very low, you may need to get potassium through an IV and be monitored in the hospital. This information is not intended to replace advice given to you by your health care provider. Make sure you discuss any questions you have with your health care provider. Document Revised: 03/11/2021 Document Reviewed: 03/11/2021 Elsevier Patient Education  [place].   Muscle Cramps and Spasms Muscle cramps and spasms are when muscles tighten by themselves. They usually get better within minutes. Muscle cramps are painful. They are usually stronger and last longer than muscle spasms. Muscle spasms may or may not be painful. They can last a few seconds or much longer. Cramps and spasms can affect any muscle, but they occur most often in the calf muscles of the leg. They are usually not caused by a serious problem. In many cases, the cause is not known. Some common causes include: Doing more physical work or exercise than your body is ready for. Using the muscles too much (overuse) by repeating certain movements too many times. Staying in a certain position for a long time. Playing a sport or doing an activity without preparing properly. Using bad form or technique while playing a sport or doing an activity. Not having enough water in your body (dehydration). Injury. Side effects of some medicines. Low levels of the salts and minerals in your blood (electrolytes), such as low potassium or calcium. Follow these instructions at home: Managing pain and stiffness     Massage, stretch,  and relax the muscle. Do this for many minutes at a time. If told, put heat on tight or tense muscles as often as told by your doctor. Use the heat source that your doctor recommends, such as a moist heat pack or a heating pad. Place a towel between your skin and  the heat source. Leave the heat on for 20-30 minutes. Remove the heat if your skin turns bright red. This is very important if you are not able to feel pain, heat, or cold. You may have a greater risk of getting burned. If told, put ice on the affected area. This may help if you are sore or have pain after a cramp or spasm. Put ice in a plastic bag. Place a towel between your skin and the bag. Leave the ice on for 20 minutes, 2-3 times a day. Try taking hot showers or baths to help relax tight muscles. Eating and drinking Drink enough fluid to keep your pee (urine) pale yellow. Eat a healthy diet to help ensure that your muscles work well. This should include: Fruits and vegetables. Lean protein. Whole grains. Low-fat or nonfat dairy products. General instructions If you are having cramps often, avoid intense exercise for several days. Take over-the-counter and prescription medicines only as told by your doctor. Watch for any changes in your symptoms. Keep all follow-up visits as told by your doctor. This is important. Contact a doctor if: Your cramps or spasms get worse or happen more often. Your cramps or spasms do not get better with time. Summary Muscle cramps and spasms are when muscles tighten by themselves. They usually get better within minutes. Cramps and spasms occur most often in the calf muscles of the leg. Massage, stretch, and relax the muscle. This may help the cramp or spasm go away. Drink enough fluid to keep your pee (urine) pale yellow. This information is not intended to replace advice given to you by your health care provider. Make sure you discuss any questions you have with your health care provider. Document Revised: 01/15/2021 Document Reviewed: 01/15/2021 Elsevier Patient Education  [place].

## 2022-08-22 NOTE — Progress Notes (Signed)
I,Victoria T Hamilton,acting as a scribe for Maximino Greenland, MD.,have documented all relevant documentation on the behalf of Maximino Greenland, MD,as directed by  Maximino Greenland, MD while in the presence of Maximino Greenland, MD.    Subjective:     Patient ID: Mary Neal , female    DOB: Jun 23, 1966 , 57 y.o.   MRN: [id number]   Chief Complaint  Patient presents with   Arm Cramps    Leg Cramps     HPI  Patient presents today for cramps & concerns with bowel movements. She is accompanied by her mother today.  She reports having excruciating cramping with her arms, legs & hands. She states sometimes her hands seem to be " stuck".   She is not sure what is precipitating her symptoms. She has tried drinking pickle juice, mustard and vinegar without relief of her sx.     Hypertension This is a chronic problem. The problem has been gradually improving since onset. The problem is controlled. Risk factors for coronary artery disease include sedentary lifestyle and post-menopausal state. Past treatments include angiotensin blockers. The current treatment provides moderate improvement.     Past Medical History:  Diagnosis Date   Abnormal Pap smear 2013   Dislocation of right hip ([place]) 06/09/2018   Per MRI   History of knee replacement 2005   Spina bifida [facility])    Uterine prolapse    Wears glasses      Family History  Problem Relation Age of Onset   Hypertension Mother    Hyperlipidemia Mother    Heart attack Brother      Current Outpatient Medications:    Digestive Enzymes (SUPER PAPAYA PLUS PO), Take 1 capsule by mouth daily at 2 am., Disp: , Rfl:    famotidine (PEPCID) 20 MG tablet, TAKE 1 TABLET BY MOUTH  DAILY, Disp: 100 tablet, Rfl: 2   Magnesium 300 MG CAPS, Take by mouth., Disp: , Rfl:    meloxicam (MOBIC) 7.5 MG tablet, Take 1 tablet by mouth x 5 days then one tab daily as needed, Disp: 30 tablet, Rfl: 1   oxybutynin (DITROPAN XL) 15 MG 24 hr tablet, TAKE 1 TABLET  BY MOUTH TWICE  DAILY, Disp: 200 tablet, Rfl: 2   UNABLE TO FIND, 1 capsule daily at 2 am. Med Name: enzyme plus, Disp: , Rfl:    valsartan (DIOVAN) 160 MG tablet, TAKE 1 TABLET BY MOUTH DAILY, Disp: 100 tablet, Rfl: 2   vitamin C (ASCORBIC ACID) 500 MG tablet, Take by mouth., Disp: , Rfl:    tobramycin-dexamethasone (TOBRADEX) ophthalmic ointment, Place 1 application into the right eye 3 (three) times daily. (Patient not taking: Reported on 07/13/2022), Disp: 3.5 g, Rfl: 0   Allergies  Allergen Reactions   Clindamycin/Lincomycin Palpitations   Latex Other (See Comments)   Penicillins Other (See Comments)     Review of Systems  Constitutional: Negative.   Respiratory: Negative.    Cardiovascular: Negative.   Musculoskeletal:  Positive for myalgias.  Neurological: Negative.   Psychiatric/Behavioral: Negative.       Today's Vitals   08/22/22 1051  BP: 128/78  Temp: 98 F (36.7 C)  SpO2: 98%  Weight: 108 lb (49 kg)  Height: '4\' 9"'$  (1.448 m)  PainSc: 0-No pain   Body mass index is 23.37 kg/m.  Wt Readings from Last 3 Encounters:  08/22/22 108 lb (49 kg)  07/13/22 104 lb 6.4 oz (47.4 kg)  07/13/22 104 lb 6.4 oz (47.4  kg)    Objective:  Physical Exam Vitals and nursing note reviewed.  Constitutional:      Appearance: Normal appearance.  HENT:     Head: Normocephalic and atraumatic.     Nose:     Comments: Masked     Mouth/Throat:     Comments: Masked  Eyes:     Extraocular Movements: Extraocular movements intact.  Cardiovascular:     Rate and Rhythm: Normal rate and regular rhythm.     Heart sounds: Normal heart sounds.  Pulmonary:     Effort: Pulmonary effort is normal.     Breath sounds: Normal breath sounds.  Musculoskeletal:     Cervical back: Normal range of motion.     Comments: Ambulatory with crutches  Skin:    General: Skin is warm.  Neurological:     General: No focal deficit present.     Mental Status: She is alert.  Psychiatric:        Mood and  Affect: Mood normal.        Behavior: Behavior normal.      Assessment And Plan:     1. Cramps, extremity Comments: She is encouraged to incorporate more potassium rich foods into her diet. I will check labs as below. Importance of adequate hydration also d/w pt. - CMP14+EGFR - CK, total - Magnesium - TSH  2. Essential hypertension, benign Comments: Chronic, well controlled. She will c/w valsartan '160mg'$  daily.  3. Neurogenic bowel Comments: Chronic, unfortunately she has intermittent constipation w/ diarrhea. Encouraged to consider probiotics.    Patient was given opportunity to ask questions. Patient verbalized understanding of the plan and was able to repeat key elements of the plan. All questions were answered to their satisfaction.   I, Maximino Greenland, MD, have reviewed all documentation for this visit. The documentation on 08/22/22 for the exam, diagnosis, procedures, and orders are all accurate and complete.   IF YOU HAVE BEEN REFERRED TO A SPECIALIST, IT MAY TAKE 1-2 WEEKS TO SCHEDULE/PROCESS THE REFERRAL. IF YOU HAVE NOT HEARD FROM US/SPECIALIST IN TWO WEEKS, PLEASE GIVE [place] A CALL AT [phone].   THE PATIENT IS ENCOURAGED TO PRACTICE SOCIAL DISTANCING DUE TO THE COVID-19 PANDEMIC.

## 2022-08-23 DIAGNOSIS — Z1211 Encounter for screening for malignant neoplasm of colon: Secondary | ICD-10-CM | POA: Diagnosis not present

## 2022-08-23 LAB — CMP14+EGFR
ALT: 29 IU/L (ref 0–32)
AST: 23 IU/L (ref 0–40)
Albumin/Globulin Ratio: 2 (ref 1.2–2.2)
Albumin: 4.3 g/dL (ref 3.8–4.9)
Alkaline Phosphatase: 76 IU/L (ref 44–121)
BUN/Creatinine Ratio: 24 — ABNORMAL HIGH (ref 9–23)
BUN: 16 mg/dL (ref 6–24)
Bilirubin Total: 0.5 mg/dL (ref 0.0–1.2)
CO2: 25 mmol/L (ref 20–29)
Calcium: 10 mg/dL (ref 8.7–10.2)
Chloride: 104 mmol/L (ref 96–106)
Creatinine, Ser: 0.68 mg/dL (ref 0.57–1.00)
Globulin, Total: 2.2 g/dL (ref 1.5–4.5)
Glucose: 71 mg/dL (ref 70–99)
Potassium: 4 mmol/L (ref 3.5–5.2)
Sodium: 141 mmol/L (ref 134–144)
Total Protein: 6.5 g/dL (ref 6.0–8.5)
eGFR: 102 mL/min/{1.73_m2} (ref >59)

## 2022-08-23 LAB — TSH: TSH: 1.33 u[IU]/mL (ref 0.450–4.500)

## 2022-08-23 LAB — MAGNESIUM: Magnesium: 2 mg/dL (ref 1.6–2.3)

## 2022-08-23 LAB — CK: Total CK: 206 U/L — ABNORMAL HIGH (ref 32–182)

## 2022-09-03 LAB — COLOGUARD: COLOGUARD: NEGATIVE

## 2022-09-08 ENCOUNTER — Other Ambulatory Visit: Payer: Self-pay | Admitting: Internal Medicine

## 2022-09-12 DIAGNOSIS — R339 Retention of urine, unspecified: Secondary | ICD-10-CM | POA: Diagnosis not present

## 2022-09-21 ENCOUNTER — Other Ambulatory Visit: Payer: Self-pay | Admitting: Internal Medicine

## 2022-10-10 ENCOUNTER — Ambulatory Visit
Admission: RE | Admit: 2022-10-10 | Discharge: 2022-10-10 | Disposition: A | Discharge status: Home / Self Care | Payer: 59 | Source: Ambulatory Visit | Attending: Internal Medicine | Admitting: Internal Medicine

## 2022-10-10 ENCOUNTER — Encounter: Payer: Self-pay | Admitting: Internal Medicine

## 2022-10-10 ENCOUNTER — Ambulatory Visit: Payer: 59 | Admitting: Internal Medicine

## 2022-10-10 ENCOUNTER — Ambulatory Visit (INDEPENDENT_AMBULATORY_CARE_PROVIDER_SITE_OTHER): Payer: 59 | Admitting: Internal Medicine

## 2022-10-10 VITALS — BP 118/74 | HR 89 | Temp 98.3°F | Ht <= 58 in | Wt 109.8 lb

## 2022-10-10 DIAGNOSIS — M25511 Pain in right shoulder: Secondary | ICD-10-CM | POA: Diagnosis not present

## 2022-10-10 DIAGNOSIS — E559 Vitamin D deficiency, unspecified: Secondary | ICD-10-CM | POA: Diagnosis not present

## 2022-10-10 DIAGNOSIS — M19011 Primary osteoarthritis, right shoulder: Secondary | ICD-10-CM | POA: Diagnosis not present

## 2022-10-10 MED ORDER — DICLOFENAC SODIUM 1 % EX GEL: 4.0000 g | Freq: Three times a day (TID) | CUTANEOUS | 0 refills | Status: DC | PRN

## 2022-10-10 NOTE — Patient Instructions (Signed)
Shoulder Pain Many things can cause shoulder pain, including: An injury. Moving the shoulder in the same way again and again (overuse). Joint pain (arthritis). Pain can come from: Swelling and irritation (inflammation) of any part of the shoulder. An injury to: The shoulder joint. Tissues that connect muscle to bone (tendons). Tissues that connect bones to each other (ligaments). Bones. Follow these instructions at home: Watch for changes in your symptoms. Let your doctor know about them. Follow these instructions to help with your pain. If you have a sling that can be taken off: Wear the sling as told by your doctor. Take it off only as told by your doctor. Check the skin around the sling every day. Tell your doctor if you see problems. Loosen the sling if your fingers: Tingle. Become numb. Become cold. Keep the sling clean. If the sling is not waterproof: Do not let it get wet. Take the sling off when you shower or bathe. Managing pain, stiffness, and swelling  If told, put ice on the painful area. Put ice in a plastic bag. Place a towel between your skin and the bag. Leave the ice on for 20 minutes, 2-3 times a day. Stop putting ice on if it does not help with the pain. If your skin turns bright red, take off the ice right away to prevent skin damage. The risk of damage is higher if you cannot feel pain, heat, or cold. Squeeze a soft ball or a foam pad as much as possible. This prevents swelling in the shoulder. It also helps to strengthen the arm. General instructions Take over-the-counter and prescription medicines only as told by your doctor. Keep all follow-up visits. This will help you avoid any type of permanent shoulder problems. Contact a doctor if: Your pain gets worse. Medicine does not help your pain. You have new pain in your arm, hand, or fingers. You loosen your sling and your arm, hand, or fingers: Tingle. Are numb. Are swollen. Get help right away  if: Your arm, hand, or fingers turn white or blue. This information is not intended to replace advice given to you by your health care provider. Make sure you discuss any questions you have with your health care provider. Document Revised: 01/28/2022 Document Reviewed: 01/28/2022 Elsevier Patient Education  2023 Elsevier Inc.  

## 2022-10-10 NOTE — Progress Notes (Signed)
I,Victoria T Hamilton,acting as a scribe for Maximino Greenland, MD.,have documented all relevant documentation on the behalf of Maximino Greenland, MD,as directed by  Maximino Greenland, MD while in the presence of Maximino Greenland, MD.    Subjective:     Patient ID: Mary Neal , female    DOB: 05/21/1966 , 57 y.o.   MRN: [id number]   Chief Complaint  Patient presents with   Shoulder Pain    HPI  Pt presents today for evaluation of right shoulder pain. Initially started 3-4 weeks ago. Described as a sharp, shooting pain. Denies fall/trauma. She reports taking Tylenol at home which didn't help.  She adds that positional changes seem to trigger the pain. She has not been taking meloxicam. She thinks it is outdated.   Shoulder Pain  The pain is present in the right shoulder. This is a new problem. The current episode started 1 to 4 weeks ago. There has been no history of extremity trauma. The quality of the pain is described as sharp. The pain is at a severity of 9/10. The pain is severe. The symptoms are aggravated by activity. She has tried acetaminophen for the symptoms.     Past Medical History:  Diagnosis Date   Abnormal Pap smear 2013   Dislocation of right hip 06/09/2018   Per MRI   History of knee replacement 2005   Spina bifida    Uterine prolapse    Wears glasses      Family History  Problem Relation Age of Onset   Hypertension Mother    Hyperlipidemia Mother    Heart attack Brother      Current Outpatient Medications:    diclofenac Sodium (VOLTAREN ARTHRITIS PAIN) 1 % GEL, Apply 4 g topically 3 (three) times daily as needed., Disp: 150 g, Rfl: 0   Digestive Enzymes (SUPER PAPAYA PLUS PO), Take 1 capsule by mouth daily at 2 am., Disp: , Rfl:    famotidine (PEPCID) 20 MG tablet, TAKE 1 TABLET BY MOUTH DAILY, Disp: 100 tablet, Rfl: 2   Magnesium 300 MG CAPS, Take by mouth., Disp: , Rfl:    meloxicam (MOBIC) 7.5 MG tablet, Take 1 tablet by mouth x 5 days then one tab  daily as needed, Disp: 30 tablet, Rfl: 1   oxybutynin (DITROPAN XL) 15 MG 24 hr tablet, TAKE 1 TABLET BY MOUTH TWICE  DAILY, Disp: 200 tablet, Rfl: 2   UNABLE TO FIND, 1 capsule daily at 2 am. Med Name: enzyme plus, Disp: , Rfl:    valsartan (DIOVAN) 160 MG tablet, TAKE 1 TABLET BY MOUTH DAILY, Disp: 100 tablet, Rfl: 2   vitamin C (ASCORBIC ACID) 500 MG tablet, Take by mouth., Disp: , Rfl:    tobramycin-dexamethasone (TOBRADEX) ophthalmic ointment, Place 1 application into the right eye 3 (three) times daily. (Patient not taking: Reported on 07/13/2022), Disp: 3.5 g, Rfl: 0   Allergies  Allergen Reactions   Clindamycin/Lincomycin Palpitations   Latex Other (See Comments)   Penicillins Other (See Comments)     Review of Systems  Constitutional: Negative.   Respiratory: Negative.    Cardiovascular: Negative.   Musculoskeletal:  Positive for arthralgias.  Neurological: Negative.   Psychiatric/Behavioral: Negative.       Today's Vitals   10/10/22 1135  BP: 118/74  Pulse: 89  Temp: 98.3 F (36.8 C)  SpO2: 98%  Weight: 109 lb 12.8 oz (49.8 kg)  Height: 4\' 9"  (1.448 m)   Body mass index is  23.76 kg/m.  Wt Readings from Last 3 Encounters:  10/10/22 109 lb 12.8 oz (49.8 kg)  08/22/22 108 lb (49 kg)  07/13/22 104 lb 6.4 oz (47.4 kg)    Objective:  Physical Exam Vitals and nursing note reviewed.  Constitutional:      Appearance: Normal appearance.  HENT:     Head: Normocephalic and atraumatic.     Nose:     Comments: Masked     Mouth/Throat:     Comments: Masked  Eyes:     Extraocular Movements: Extraocular movements intact.  Cardiovascular:     Rate and Rhythm: Normal rate and regular rhythm.     Heart sounds: Normal heart sounds.  Pulmonary:     Effort: Pulmonary effort is normal.     Breath sounds: Normal breath sounds.  Musculoskeletal:        General: Tenderness present. Normal range of motion.     Cervical back: Normal range of motion.     Comments: Ambulatory  w/ crutches  Skin:    General: Skin is warm.  Neurological:     General: No focal deficit present.     Mental Status: She is alert.  Psychiatric:        Mood and Affect: Mood normal.        Behavior: Behavior normal.         Assessment And Plan:     1. Acute pain of right shoulder Comments: She is advised to apply Voltaren gel to affected area tid prn. I will also send her for a shoulder x-ray. She and her mother agree w/ treatment plan. - DG Shoulder Right; Future  2. Vitamin D deficiency disease Comments: I will check a vitamin D level and supplement as needed. - Vitamin D (25 hydroxy)     Patient was given opportunity to ask questions. Patient verbalized understanding of the plan and was able to repeat key elements of the plan. All questions were answered to their satisfaction.   I, Maximino Greenland, MD, have reviewed all documentation for this visit. The documentation on 10/10/22 for the exam, diagnosis, procedures, and orders are all accurate and complete.   IF YOU HAVE BEEN REFERRED TO A SPECIALIST, IT MAY TAKE 1-2 WEEKS TO SCHEDULE/PROCESS THE REFERRAL. IF YOU HAVE NOT HEARD FROM US/SPECIALIST IN TWO WEEKS, PLEASE GIVE [place] A CALL AT [phone].   THE PATIENT IS ENCOURAGED TO PRACTICE SOCIAL DISTANCING DUE TO THE COVID-19 PANDEMIC.

## 2022-10-11 LAB — VITAMIN D 25 HYDROXY (VIT D DEFICIENCY, FRACTURES): Vit D, 25-Hydroxy: 47.2 ng/mL (ref 30.0–100.0)

## 2022-10-12 DIAGNOSIS — R339 Retention of urine, unspecified: Secondary | ICD-10-CM | POA: Diagnosis not present

## 2022-11-02 ENCOUNTER — Other Ambulatory Visit: Payer: Self-pay | Admitting: Internal Medicine

## 2022-11-03 ENCOUNTER — Ambulatory Visit (INDEPENDENT_AMBULATORY_CARE_PROVIDER_SITE_OTHER): Payer: 59 | Admitting: Physician Assistant

## 2022-11-03 ENCOUNTER — Encounter: Payer: Self-pay | Admitting: Physician Assistant

## 2022-11-03 DIAGNOSIS — M25511 Pain in right shoulder: Secondary | ICD-10-CM | POA: Diagnosis not present

## 2022-11-03 MED ORDER — METHYLPREDNISOLONE ACETATE 40 MG/ML IJ SUSP
40.0000 mg | INTRAMUSCULAR | Status: AC | PRN
Start: 2022-11-03 — End: 2022-11-03
  Administered 2022-11-03: 40 mg via INTRA_ARTICULAR

## 2022-11-03 MED ORDER — LIDOCAINE HCL 1 % IJ SOLN
3.0000 mL | INTRAMUSCULAR | Status: AC | PRN
Start: 2022-11-03 — End: 2022-11-03
  Administered 2022-11-03: 3 mL

## 2022-11-03 NOTE — Progress Notes (Signed)
HPI: Patient presents today with right shoulder pain.  Her pains been ongoing since early March.  No injury to the right shoulder.  She did have some radiographs performed by her primary care physician which showed some mild arthritic changes.  My review of these films shows that the humeral head to be well located.  No acute fractures.  AC joint well-maintained.  Mild narrowing of joint space and peripheral osteophyte off the inferior glenoid.  Patient is nondiabetic.  Review of systems: Negative for fevers chills.  Please see HPI otherwise negative.  Physical exam: General: No acute distress. Psych: Alert and oriented x 3 Bilateral shoulders: 5 out of 5 strength external and internal rotation against resistance empty can test is negative bilaterally.  Impingement testing mildly positive on the right negative on the left.  No crepitus with passive range of motion of either shoulder.  Liftoff test is negative bilaterally.    Procedure Note  Patient: Mary Neal             Date of Birth: 1965-12-17           MRN: [id number]             Visit Date: 11/03/2022  Procedures: Visit Diagnoses:  1. Acute pain of right shoulder     Large Joint Inj: R subacromial bursa on 11/03/2022 5:11 PM Indications: pain Details: 22 G 1.5 in needle, superior approach  Arthrogram: No  Medications: 3 mL lidocaine 1 %; 40 mg methylPREDNISolone acetate 40 MG/ML Outcome: tolerated well, no immediate complications Procedure, treatment alternatives, risks and benefits explained, specific risks discussed. Consent was given by the patient. Immediately prior to procedure a time out was called to verify the correct patient, procedure, equipment, support staff and site/side marked as required. Patient was prepped and draped in the usual sterile fashion.     Plan: She shown pendulum and forward flexion exercises for the shoulder.  She tolerated the injection well.  She continues to have pain she will call our  office and let us know she may benefit from an intra-articular injection.  Questions were encouraged and answered at length.

## 2022-11-28 ENCOUNTER — Ambulatory Visit: Payer: 59 | Admitting: Orthopaedic Surgery

## 2022-11-29 DIAGNOSIS — R339 Retention of urine, unspecified: Secondary | ICD-10-CM | POA: Diagnosis not present

## 2022-12-12 ENCOUNTER — Ambulatory Visit: Payer: Medicare Other | Admitting: Internal Medicine

## 2022-12-22 ENCOUNTER — Telehealth: Payer: Self-pay | Admitting: Orthopaedic Surgery

## 2022-12-22 NOTE — Telephone Encounter (Signed)
Last given by [employer]. Can we just write another rx or does she need to come in?

## 2022-12-22 NOTE — Telephone Encounter (Signed)
I dont have a [facility] at [facility]

## 2022-12-22 NOTE — Telephone Encounter (Signed)
Pt's mom Berton Mount called requesting a call back from Dr Magnus Ivan. Pt's mom states pt has leg braces that need to be repaired and also need new shoes but unsure If Dr Magnus Ivan is the one who ordered pt's leg braces and special shoes for pt. She is asking for Dr Magnus Ivan to call her to figure it out. Pt phone number is [phone].

## 2022-12-23 ENCOUNTER — Telehealth: Payer: Self-pay | Admitting: Orthopaedic Surgery

## 2022-12-23 NOTE — Telephone Encounter (Signed)
[facility] called about braces for pt. [facility] explained for Insurance reason pt need a face to face in the last 6 months. Will reach out to pt to set an appt for script for be fille with [facility] for leg braces.

## 2022-12-23 NOTE — Telephone Encounter (Signed)
Patient aware this was sent for her

## 2023-01-04 ENCOUNTER — Ambulatory Visit (INDEPENDENT_AMBULATORY_CARE_PROVIDER_SITE_OTHER): Payer: 59 | Admitting: Internal Medicine

## 2023-01-04 ENCOUNTER — Encounter: Payer: Self-pay | Admitting: Internal Medicine

## 2023-01-04 VITALS — BP 140/82 | HR 84 | Temp 98.2°F | Ht <= 58 in | Wt 110.4 lb

## 2023-01-04 DIAGNOSIS — Q059 Spina bifida, unspecified: Secondary | ICD-10-CM

## 2023-01-04 DIAGNOSIS — I10 Essential (primary) hypertension: Secondary | ICD-10-CM | POA: Diagnosis not present

## 2023-01-04 DIAGNOSIS — K9049 Malabsorption due to intolerance, not elsewhere classified: Secondary | ICD-10-CM

## 2023-01-04 LAB — BMP8+EGFR
BUN/Creatinine Ratio: 23 (ref 9–23)
BUN: 18 mg/dL (ref 6–24)
CO2: 23 mmol/L (ref 20–29)
Calcium: 10.4 mg/dL — ABNORMAL HIGH (ref 8.7–10.2)
Chloride: 102 mmol/L (ref 96–106)
Creatinine, Ser: 0.77 mg/dL (ref 0.57–1.00)
Glucose: 78 mg/dL (ref 70–99)
Potassium: 4.1 mmol/L (ref 3.5–5.2)
Sodium: 142 mmol/L (ref 134–144)
eGFR: 90 mL/min/{1.73_m2} (ref >59)

## 2023-01-04 NOTE — Patient Instructions (Signed)
Hypertension, Adult Hypertension is another name for high blood pressure. High blood pressure forces your heart to work harder to pump blood. This can cause problems over time. There are two numbers in a blood pressure reading. There is a top number (systolic) over a bottom number (diastolic). It is best to have a blood pressure that is below 120/80. What are the causes? The cause of this condition is not known. Some other conditions can lead to high blood pressure. What increases the risk? Some lifestyle factors can make you more likely to develop high blood pressure: Smoking. Not getting enough exercise or physical activity. Being overweight. Having too much fat, sugar, calories, or salt (sodium) in your diet. Drinking too much alcohol. Other risk factors include: Having any of these conditions: Heart disease. Diabetes. High cholesterol. Kidney disease. Obstructive sleep apnea. Having a family history of high blood pressure and high cholesterol. Age. The risk increases with age. Stress. What are the signs or symptoms? High blood pressure may not cause symptoms. Very high blood pressure (hypertensive crisis) may cause: Headache. Fast or uneven heartbeats (palpitations). Shortness of breath. Nosebleed. Vomiting or feeling like you may vomit (nauseous). Changes in how you see. Very bad chest pain. Feeling dizzy. Seizures. How is this treated? This condition is treated by making healthy lifestyle changes, such as: Eating healthy foods. Exercising more. Drinking less alcohol. Your doctor may prescribe medicine if lifestyle changes do not help enough and if: Your top number is above 130. Your bottom number is above 80. Your personal target blood pressure may vary. Follow these instructions at home: Eating and drinking  If told, follow the DASH eating plan. To follow this plan: Fill one half of your plate at each meal with fruits and vegetables. Fill one fourth of your plate  at each meal with whole grains. Whole grains include whole-wheat pasta, brown rice, and whole-grain bread. Eat or drink low-fat dairy products, such as skim milk or low-fat yogurt. Fill one fourth of your plate at each meal with low-fat (lean) proteins. Low-fat proteins include fish, chicken without skin, eggs, beans, and tofu. Avoid fatty meat, cured and processed meat, or chicken with skin. Avoid pre-made or processed food. Limit the amount of salt in your diet to less than 1,500 mg each day. Do not drink alcohol if: Your doctor tells you not to drink. You are pregnant, may be pregnant, or are planning to become pregnant. If you drink alcohol: Limit how much you have to: 0-1 drink a day for women. 0-2 drinks a day for men. Know how much alcohol is in your drink. In the [place] one drink equals one 12 oz bottle of beer (355 mL), one 5 oz glass of wine (148 mL), or one 1 oz glass of hard liquor (44 mL). Lifestyle  Work with your doctor to stay at a healthy weight or to lose weight. Ask your doctor what the best weight is for you. Get at least 30 minutes of exercise that causes your heart to beat faster (aerobic exercise) most days of the week. This may include walking, swimming, or biking. Get at least 30 minutes of exercise that strengthens your muscles (resistance exercise) at least 3 days a week. This may include lifting weights or doing Pilates. Do not smoke or use any products that contain nicotine or tobacco. If you need help quitting, ask your doctor. Check your blood pressure at home as told by your doctor. Keep all follow-up visits. Medicines Take over-the-counter and prescription medicines   only as told by your doctor. Follow directions carefully. Do not skip doses of blood pressure medicine. The medicine does not work as well if you skip doses. Skipping doses also puts you at risk for problems. Ask your doctor about side effects or reactions to medicines that you should watch  for. Contact a doctor if: You think you are having a reaction to the medicine you are taking. You have headaches that keep coming back. You feel dizzy. You have swelling in your ankles. You have trouble with your vision. Get help right away if: You get a very bad headache. You start to feel mixed up (confused). You feel weak or numb. You feel faint. You have very bad pain in your: Chest. Belly (abdomen). You vomit more than once. You have trouble breathing. These symptoms may be an emergency. Get help right away. Call 911. Do not wait to see if the symptoms will go away. Do not drive yourself to the hospital. Summary Hypertension is another name for high blood pressure. High blood pressure forces your heart to work harder to pump blood. For most people, a normal blood pressure is less than 120/80. Making healthy choices can help lower blood pressure. If your blood pressure does not get lower with healthy choices, you may need to take medicine. This information is not intended to replace advice given to you by your health care provider. Make sure you discuss any questions you have with your health care provider. Document Revised: 04/15/2021 Document Reviewed: 04/15/2021 Elsevier Patient Education  2024 Elsevier Inc.  

## 2023-01-04 NOTE — Progress Notes (Unsigned)
I,[name], CMA,acting as a Neurosurgeon for [name], MD.,have documented all relevant documentation on the behalf of [name], MD,as directed by  [name], MD while in the presence of [name], MD.  Subjective:  Patient ID: Mary Neal , female    DOB: 05/09/1966 , 57 y.o.   MRN: [id number]  Chief Complaint  Patient presents with  . Hypertension    HPI  She presents today for BP & vitamin D check. She reports compliance with meds. She is accompanied by her Mother today. She denies headaches, palpitations and shortness of breath.   She adds needing a updated diet restriction list to give to [facility]. She wants a letter stating she cannot eat corn on the cob, cabbage, brussel sprouts, sauerkraut, into beans, blackeye peas, northern beans, baked beans,kidney beans, spaghetti, Sloppy Joe and pork and beans. These foods cause her to have diarrhea.   She is able to eat lima beans and string beans.   Hypertension This is a chronic problem. The problem has been gradually improving since onset. The problem is controlled. Past treatments include angiotensin blockers. The current treatment provides moderate improvement. There is no history of CVA or heart failure.     Past Medical History:  Diagnosis Date  . Abnormal Pap smear 2013  . Dislocation of right hip (HCC) 06/09/2018   Per MRI  . History of knee replacement 2005  . Spina bifida (HCC)   . Uterine prolapse   . Wears glasses      Family History  Problem Relation Age of Onset  . Hypertension Mother   . Hyperlipidemia Mother   . Heart attack Brother      Current Outpatient Medications:  .  diclofenac Sodium (VOLTAREN) 1 % GEL, APPLY 4 G TOPICALLY 3 (THREE) TIMES DAILY AS NEEDED., Disp: 100 g, Rfl: 0 .  Digestive Enzymes (SUPER PAPAYA PLUS PO), Take 1 capsule by mouth daily at 2 am., Disp: , Rfl:  .  famotidine (PEPCID) 20 MG tablet, TAKE 1 TABLET BY MOUTH DAILY, Disp:  100 tablet, Rfl: 2 .  Magnesium 300 MG CAPS, Take by mouth., Disp: , Rfl:  .  meloxicam (MOBIC) 7.5 MG tablet, Take 1 tablet by mouth x 5 days then one tab daily as needed, Disp: 30 tablet, Rfl: 1 .  oxybutynin (DITROPAN XL) 15 MG 24 hr tablet, TAKE 1 TABLET BY MOUTH TWICE  DAILY, Disp: 200 tablet, Rfl: 2 .  UNABLE TO FIND, 1 capsule daily at 2 am. Med Name: enzyme plus, Disp: , Rfl:  .  valsartan (DIOVAN) 160 MG tablet, TAKE 1 TABLET BY MOUTH DAILY, Disp: 100 tablet, Rfl: 2 .  vitamin C (ASCORBIC ACID) 500 MG tablet, Take by mouth., Disp: , Rfl:  .  tobramycin-dexamethasone (TOBRADEX) ophthalmic ointment, Place 1 application into the right eye 3 (three) times daily. (Patient not taking: Reported on 07/13/2022), Disp: 3.5 g, Rfl: 0   Allergies  Allergen Reactions  . Clindamycin/Lincomycin Palpitations  . Latex Other (See Comments)  . Penicillins Other (See Comments)     Review of Systems  Constitutional: Negative.   Respiratory: Negative.    Cardiovascular: Negative.   Gastrointestinal: Negative.   Musculoskeletal: Negative.   Neurological: Negative.   Psychiatric/Behavioral: Negative.       Today's Vitals   01/04/23 1015 01/04/23 1057  BP: (!) 150/82 (!) 140/82  Pulse: 84   Temp: 98.2 F (36.8 C)   SpO2: 98%  Weight: 110 lb 6.4 oz (50.1 kg)   Height: 4\' 9"  (1.448 m)    Body mass index is 23.89 kg/m.  Wt Readings from Last 3 Encounters:  01/04/23 110 lb 6.4 oz (50.1 kg)  10/10/22 109 lb 12.8 oz (49.8 kg)  08/22/22 108 lb (49 kg)    BP Readings from Last 3 Encounters:  01/04/23 (!) 140/82  10/10/22 118/74  08/22/22 128/78     Objective:  Physical Exam Vitals and nursing note reviewed.  Constitutional:      Appearance: Normal appearance.  HENT:     Head: Normocephalic and atraumatic.  Eyes:     Extraocular Movements: Extraocular movements intact.  Cardiovascular:     Rate and Rhythm: Normal rate and regular rhythm.     Heart sounds: Normal heart sounds.   Pulmonary:     Effort: Pulmonary effort is normal.     Breath sounds: Normal breath sounds.  Musculoskeletal:     Cervical back: Normal range of motion.     Comments: Ambulatory with crutches  Skin:    General: Skin is warm.  Neurological:     General: No focal deficit present.     Mental Status: She is alert.  Psychiatric:        Mood and Affect: Mood normal.        Behavior: Behavior normal.        Assessment And Plan:  1. Essential hypertension, benign Comments: Chronic, uncontrolled. Previous readings are normal, no med changes today. She will c/w valsartan 160mg  daily for now. She will f/u in 4 months. - BMP8+eGFR  2. Spina bifida without hydrocephalus, unspecified spinal region [facility]) Comments: Chronic, ambulatory with crutches.  3. Food intolerance in adult Comments: Letter written during visit as requested.  Mother advised to provide lunch for pt when forbidden foods are on the menu. She states this is too much of a responsibility for her.    Return for . same day as mothers. , 4 month bp check.  Patient was given opportunity to ask questions. Patient verbalized understanding of the plan and was able to repeat key elements of the plan. All questions were answered to their satisfaction.   I, [name], MD, have reviewed all documentation for this visit. The documentation on 01/04/23 for the exam, diagnosis, procedures, and orders are all accurate and complete.   IF YOU HAVE BEEN REFERRED TO A SPECIALIST, IT MAY TAKE 1-2 WEEKS TO SCHEDULE/PROCESS THE REFERRAL. IF YOU HAVE NOT HEARD FROM US/SPECIALIST IN TWO WEEKS, PLEASE GIVE [place] A CALL AT [phone].

## 2023-01-06 ENCOUNTER — Ambulatory Visit: Payer: Self-pay

## 2023-01-06 NOTE — Patient Instructions (Signed)
Visit Information  Thank you for taking time to visit with me today. Please don't hesitate to contact me if I can be of assistance to you.   Following are the goals we discussed today:   Goals Addressed             This Visit's Progress    I would like to keep my BP under good control       Care Coordination Interventions: Evaluation of current treatment plan related to hypertension self management and patient's adherence to plan as established by provider Advised patient, providing education and rationale, to monitor blood pressure weekly and record, calling PCP for findings outside established parameters Review of patient status, including review of consultant's reports, relevant laboratory and other test results Last practice recorded BP readings:  BP Readings from Last 3 Encounters:  01/04/23 (!) 140/82  10/10/22 118/74  08/22/22 128/78  Most recent eGFR/CrCl:  Lab Results  Component Value Date   EGFR 90 01/04/2023    No components found for: "CRCL"           Our next appointment is by telephone on 05/15/23 at 1:30 PM  Please call the care guide team at [phone] if you need to cancel or reschedule your appointment.   If you are experiencing a Mental Health or Behavioral Health Crisis or need someone to talk to, please call 1-800-273-TALK (toll free, 24 hour hotline)  Patient verbalizes understanding of instructions and care plan provided today and agrees to view in MyChart. Active MyChart status and patient understanding of how to access instructions and care plan via MyChart confirmed with patient.     Delsa Sale, RN, BSN, CCM Care Management Coordinator [facility] Care Management  Direct Phone: [phone]

## 2023-01-06 NOTE — Patient Outreach (Signed)
  Care Coordination   Follow Up Visit Note   01/06/2023 Name: Mary Neal MRN: [id number] DOB: 08-05-1965  Mary Neal is a 57 y.o. year old female who sees Dorothyann Peng, MD for primary care. I spoke with mother Rheya Wolak by phone today.  What matters to the patients health and wellness today?  Patient and mom would like for patient's BP to stay within normal limits to avoid adding more medication.     Goals Addressed             This Visit's Progress    I would like to keep my BP under good control       Care Coordination Interventions: Evaluation of current treatment plan related to hypertension self management and patient's adherence to plan as established by provider Advised patient, providing education and rationale, to monitor blood pressure weekly and record, calling PCP for findings outside established parameters Review of patient status, including review of consultant's reports, relevant laboratory and other test results Last practice recorded BP readings:  BP Readings from Last 3 Encounters:  01/04/23 (!) 140/82  10/10/22 118/74  08/22/22 128/78  Most recent eGFR/CrCl:  Lab Results  Component Value Date   EGFR 90 01/04/2023    No components found for: "CRCL"    Interventions Today    Flowsheet Row Most Recent Value  Chronic Disease   Chronic disease during today's visit Other, Hypertension (HTN)  [s/p shoulder pain]  General Interventions   General Interventions Discussed/Reviewed General Interventions Discussed, General Interventions Reviewed, Doctor Visits  Doctor Visits Discussed/Reviewed Doctor Visits Discussed, Doctor Visits Reviewed, PCP, Specialist  Education Interventions   Education Provided Provided Education  Provided Verbal Education On When to see the doctor, Medication  Pharmacy Interventions   Pharmacy Dicussed/Reviewed Pharmacy Topics Discussed, Pharmacy Topics Reviewed, Medications and their functions          SDOH  assessments and interventions completed:  No     Care Coordination Interventions:  Yes, provided   Follow up plan: Follow up call scheduled for 05/15/23 @1 :30 PM    Encounter Outcome:  Pt. Visit Completed

## 2023-01-06 NOTE — Patient Outreach (Signed)
  Care Coordination   01/06/2023 Name: SUDA WILLS MRN: [id number] DOB: 02/18/1966   Care Coordination Outreach Attempts:  An unsuccessful telephone outreach was attempted for a scheduled appointment today.  Follow Up Plan:  Additional outreach attempts will be made to offer the patient care coordination information and services.   Encounter Outcome:  No Answer   Care Coordination Interventions:  No, not indicated    Delsa Sale, RN, BSN, CCM Care Management Coordinator [facility] Care Management  Direct Phone: [phone]

## 2023-01-09 ENCOUNTER — Ambulatory Visit (INDEPENDENT_AMBULATORY_CARE_PROVIDER_SITE_OTHER): Payer: 59 | Admitting: Physician Assistant

## 2023-01-09 ENCOUNTER — Encounter: Payer: Self-pay | Admitting: Physician Assistant

## 2023-01-09 DIAGNOSIS — Q059 Spina bifida, unspecified: Secondary | ICD-10-CM | POA: Diagnosis not present

## 2023-01-09 NOTE — Progress Notes (Signed)
HPI: Mrs. Sanguinetti returns today for face-to-face for new braces.  She does relate that right shoulder pain is much improved since undergoing the subacromial injection. She has a history of spina bifida.  She has profound bilateral lower extremity weakness.  She wears a hinged braces both legs.  Along with double upright braces both shoes.  Mobilizes with arm crutches bilaterally.  She has had no new injuries.  No changes in overall medical status.   Physical exam: General pleasant female in no acute distress mood and affect appropriate Braces/shoes.  She has wear bottom aspect of both shoes.  Worn Velcro on her lower leg braces.  The straps on her shoes are actually worn also.  Impression: History of spina bifida  Plan: She is given prescription for new braces and shoes.  She will follow-up with [place] as needed.  Questions were encouraged and answered

## 2023-01-16 DIAGNOSIS — R339 Retention of urine, unspecified: Secondary | ICD-10-CM | POA: Diagnosis not present

## 2023-02-15 DIAGNOSIS — R2689 Other abnormalities of gait and mobility: Secondary | ICD-10-CM | POA: Diagnosis not present

## 2023-02-15 DIAGNOSIS — R339 Retention of urine, unspecified: Secondary | ICD-10-CM | POA: Diagnosis not present

## 2023-03-06 DIAGNOSIS — H43812 Vitreous degeneration, left eye: Secondary | ICD-10-CM | POA: Diagnosis not present

## 2023-03-06 DIAGNOSIS — H53032 Strabismic amblyopia, left eye: Secondary | ICD-10-CM | POA: Diagnosis not present

## 2023-03-06 DIAGNOSIS — H1045 Other chronic allergic conjunctivitis: Secondary | ICD-10-CM | POA: Diagnosis not present

## 2023-03-06 DIAGNOSIS — H5000 Unspecified esotropia: Secondary | ICD-10-CM | POA: Diagnosis not present

## 2023-03-09 DIAGNOSIS — K5900 Constipation, unspecified: Secondary | ICD-10-CM | POA: Diagnosis not present

## 2023-03-09 DIAGNOSIS — Q057 Lumbar spina bifida without hydrocephalus: Secondary | ICD-10-CM | POA: Diagnosis not present

## 2023-03-09 DIAGNOSIS — R252 Cramp and spasm: Secondary | ICD-10-CM | POA: Diagnosis not present

## 2023-03-09 DIAGNOSIS — K592 Neurogenic bowel, not elsewhere classified: Secondary | ICD-10-CM | POA: Diagnosis not present

## 2023-03-15 ENCOUNTER — Ambulatory Visit
Admission: RE | Admit: 2023-03-15 | Discharge: 2023-03-15 | Disposition: A | Discharge status: Home / Self Care | Payer: 59 | Source: Ambulatory Visit | Attending: Physical Medicine and Rehabilitation | Admitting: Physical Medicine and Rehabilitation

## 2023-03-15 ENCOUNTER — Other Ambulatory Visit: Payer: Self-pay | Admitting: Physical Medicine and Rehabilitation

## 2023-03-15 DIAGNOSIS — K5900 Constipation, unspecified: Secondary | ICD-10-CM | POA: Diagnosis not present

## 2023-03-17 DIAGNOSIS — R339 Retention of urine, unspecified: Secondary | ICD-10-CM | POA: Diagnosis not present

## 2023-03-20 ENCOUNTER — Other Ambulatory Visit: Payer: Self-pay | Admitting: Internal Medicine

## 2023-03-27 ENCOUNTER — Telehealth: Payer: Self-pay | Admitting: Pharmacist

## 2023-03-27 NOTE — Progress Notes (Signed)
03/27/2023  Patient ID: Mary Neal, female   DOB: April 19, 1966, 57 y.o.   MRN: [id number]  Spoke with the patient's mother on the phone today. Reports she has not checked the patient's BP in awhile. Calling for [employer] BP metric due to elevated BP of 140/82 on 01/04/23. Reports she will check when Zamia gets home and ask the nurse for recents readings as well.  Requested I call back tomorrow to update chart.  Marlowe Aschoff, PharmD [facility] Phone Number: [phone]

## 2023-04-03 ENCOUNTER — Other Ambulatory Visit: Payer: Self-pay | Admitting: Pharmacist

## 2023-04-03 NOTE — Progress Notes (Signed)
04/03/2023  Patient ID: Mary Neal, female   DOB: 12-23-1965, 57 y.o.   MRN: [id number]  Patient appearing on report for [employer] Metric - Hypertension Control report due to last documented ambulatory blood pressure of 140/82 on 01/04/23. Next appointment with PCP is 05/11/23   Outreached patient to discuss hypertension control and medication management.   Current antihypertensives: Valsartan 160mg   Patient has an automated upper arm home BP machine.  Current blood pressure readings: 113/53, 120/66  Reports BP was high after a few laps around the track in her leg braces, but was able to get it to decrease after sitting down   Patient denies hypotensive signs and symptoms including dizziness, lightheadedness.  Patient denies hypertensive symptoms including headache, chest pain, shortness of breath.  Patient denies side effects related to medications.     Assessment/Plan: - Currently controlled - - Reviewed goal blood pressure <130/80 - Reviewed appropriate administration of medication regimen - Counseled on long term microvascular and macrovascular complications of uncontrolled hypertension - Reviewed appropriate home BP monitoring technique (avoid caffeine, smoking, and exercise for 30 minutes before checking, rest for at least 5 minutes before taking BP, sit with feet flat on the floor and back against a hard surface, uncross legs, and rest arm on flat surface)    Marlowe Aschoff, PharmD [facility] Phone Number: [phone]

## 2023-04-17 DIAGNOSIS — R339 Retention of urine, unspecified: Secondary | ICD-10-CM | POA: Diagnosis not present

## 2023-05-11 ENCOUNTER — Ambulatory Visit (INDEPENDENT_AMBULATORY_CARE_PROVIDER_SITE_OTHER): Payer: 59 | Admitting: Internal Medicine

## 2023-05-11 ENCOUNTER — Encounter: Payer: Self-pay | Admitting: Internal Medicine

## 2023-05-11 VITALS — BP 140/82 | HR 83 | Temp 97.7°F | Ht <= 58 in | Wt 112.6 lb

## 2023-05-11 DIAGNOSIS — M79642 Pain in left hand: Secondary | ICD-10-CM | POA: Diagnosis not present

## 2023-05-11 DIAGNOSIS — I10 Essential (primary) hypertension: Secondary | ICD-10-CM

## 2023-05-11 DIAGNOSIS — Z23 Encounter for immunization: Secondary | ICD-10-CM | POA: Diagnosis not present

## 2023-05-11 DIAGNOSIS — M791 Myalgia, unspecified site: Secondary | ICD-10-CM

## 2023-05-11 DIAGNOSIS — K592 Neurogenic bowel, not elsewhere classified: Secondary | ICD-10-CM | POA: Diagnosis not present

## 2023-05-11 DIAGNOSIS — M79641 Pain in right hand: Secondary | ICD-10-CM

## 2023-05-11 DIAGNOSIS — Q059 Spina bifida, unspecified: Secondary | ICD-10-CM | POA: Diagnosis not present

## 2023-05-11 DIAGNOSIS — D696 Thrombocytopenia, unspecified: Secondary | ICD-10-CM

## 2023-05-11 MED ORDER — AMLODIPINE BESYLATE 2.5 MG PO TABS: 2.5000 mg | ORAL_TABLET | Freq: Every day | ORAL | 11 refills | Status: DC

## 2023-05-11 NOTE — Patient Instructions (Signed)
Hypertension, Adult Hypertension is another name for high blood pressure. High blood pressure forces your heart to work harder to pump blood. This can cause problems over time. There are two numbers in a blood pressure reading. There is a top number (systolic) over a bottom number (diastolic). It is best to have a blood pressure that is below 120/80. What are the causes? The cause of this condition is not known. Some other conditions can lead to high blood pressure. What increases the risk? Some lifestyle factors can make you more likely to develop high blood pressure: Smoking. Not getting enough exercise or physical activity. Being overweight. Having too much fat, sugar, calories, or salt (sodium) in your diet. Drinking too much alcohol. Other risk factors include: Having any of these conditions: Heart disease. Diabetes. High cholesterol. Kidney disease. Obstructive sleep apnea. Having a family history of high blood pressure and high cholesterol. Age. The risk increases with age. Stress. What are the signs or symptoms? High blood pressure may not cause symptoms. Very high blood pressure (hypertensive crisis) may cause: Headache. Fast or uneven heartbeats (palpitations). Shortness of breath. Nosebleed. Vomiting or feeling like you may vomit (nauseous). Changes in how you see. Very bad chest pain. Feeling dizzy. Seizures. How is this treated? This condition is treated by making healthy lifestyle changes, such as: Eating healthy foods. Exercising more. Drinking less alcohol. Your doctor may prescribe medicine if lifestyle changes do not help enough and if: Your top number is above 130. Your bottom number is above 80. Your personal target blood pressure may vary. Follow these instructions at home: Eating and drinking  If told, follow the DASH eating plan. To follow this plan: Fill one half of your plate at each meal with fruits and vegetables. Fill one fourth of your plate  at each meal with whole grains. Whole grains include whole-wheat pasta, brown rice, and whole-grain bread. Eat or drink low-fat dairy products, such as skim milk or low-fat yogurt. Fill one fourth of your plate at each meal with low-fat (lean) proteins. Low-fat proteins include fish, chicken without skin, eggs, beans, and tofu. Avoid fatty meat, cured and processed meat, or chicken with skin. Avoid pre-made or processed food. Limit the amount of salt in your diet to less than 1,500 mg each day. Do not drink alcohol if: Your doctor tells you not to drink. You are pregnant, may be pregnant, or are planning to become pregnant. If you drink alcohol: Limit how much you have to: 0-1 drink a day for women. 0-2 drinks a day for men. Know how much alcohol is in your drink. In the [place] one drink equals one 12 oz bottle of beer (355 mL), one 5 oz glass of wine (148 mL), or one 1 oz glass of hard liquor (44 mL). Lifestyle  Work with your doctor to stay at a healthy weight or to lose weight. Ask your doctor what the best weight is for you. Get at least 30 minutes of exercise that causes your heart to beat faster (aerobic exercise) most days of the week. This may include walking, swimming, or biking. Get at least 30 minutes of exercise that strengthens your muscles (resistance exercise) at least 3 days a week. This may include lifting weights or doing Pilates. Do not smoke or use any products that contain nicotine or tobacco. If you need help quitting, ask your doctor. Check your blood pressure at home as told by your doctor. Keep all follow-up visits. Medicines Take over-the-counter and prescription medicines   only as told by your doctor. Follow directions carefully. Do not skip doses of blood pressure medicine. The medicine does not work as well if you skip doses. Skipping doses also puts you at risk for problems. Ask your doctor about side effects or reactions to medicines that you should watch  for. Contact a doctor if: You think you are having a reaction to the medicine you are taking. You have headaches that keep coming back. You feel dizzy. You have swelling in your ankles. You have trouble with your vision. Get help right away if: You get a very bad headache. You start to feel mixed up (confused). You feel weak or numb. You feel faint. You have very bad pain in your: Chest. Belly (abdomen). You vomit more than once. You have trouble breathing. These symptoms may be an emergency. Get help right away. Call 911. Do not wait to see if the symptoms will go away. Do not drive yourself to the hospital. Summary Hypertension is another name for high blood pressure. High blood pressure forces your heart to work harder to pump blood. For most people, a normal blood pressure is less than 120/80. Making healthy choices can help lower blood pressure. If your blood pressure does not get lower with healthy choices, you may need to take medicine. This information is not intended to replace advice given to you by your health care provider. Make sure you discuss any questions you have with your health care provider. Document Revised: 04/15/2021 Document Reviewed: 04/15/2021 Elsevier Patient Education  2024 Elsevier Inc.  

## 2023-05-11 NOTE — Assessment & Plan Note (Signed)
Chronic, uncontrolled. She will continue with valsartan 160mg  daily in AM. I will add amlodipine 2.5mg  nightly. Advised to take with evening meal. She agrees to rto in 2 weeks for [place].

## 2023-05-12 LAB — ARTHRITIS PANEL
Basophils Absolute: 0 10*3/uL (ref 0.0–0.2)
Basos: 0 %
EOS (ABSOLUTE): 0.1 10*3/uL (ref 0.0–0.4)
Eos: 1 %
Hematocrit: 41.7 % (ref 34.0–46.6)
Hemoglobin: 13.7 g/dL (ref 11.1–15.9)
Immature Grans (Abs): 0 10*3/uL (ref 0.0–0.1)
Immature Granulocytes: 0 %
Lymphocytes Absolute: 2.2 10*3/uL (ref 0.7–3.1)
Lymphs: 35 %
MCH: 31.3 pg (ref 26.6–33.0)
MCHC: 32.9 g/dL (ref 31.5–35.7)
MCV: 95 fL (ref 79–97)
Monocytes Absolute: 0.5 10*3/uL (ref 0.1–0.9)
Monocytes: 8 %
Neutrophils Absolute: 3.5 10*3/uL (ref 1.4–7.0)
Neutrophils: 56 %
Platelets: 171 10*3/uL (ref 150–450)
RBC: 4.38 x10E6/uL (ref 3.77–5.28)
RDW: 11.8 % (ref 11.7–15.4)
Rheumatoid fact SerPl-aCnc: <10 [IU]/mL (ref <14.0)
Sed Rate: 3 mm/h (ref 0–40)
Uric Acid: 4.4 mg/dL (ref 3.0–7.2)
WBC: 6.2 10*3/uL (ref 3.4–10.8)

## 2023-05-12 LAB — CMP14+EGFR
ALT: 21 [IU]/L (ref 0–32)
AST: 23 [IU]/L (ref 0–40)
Albumin: 4.1 g/dL (ref 3.8–4.9)
Alkaline Phosphatase: 76 [IU]/L (ref 44–121)
BUN/Creatinine Ratio: 38 — ABNORMAL HIGH (ref 9–23)
BUN: 27 mg/dL — ABNORMAL HIGH (ref 6–24)
Bilirubin Total: 0.4 mg/dL (ref 0.0–1.2)
CO2: 23 mmol/L (ref 20–29)
Calcium: 9.8 mg/dL (ref 8.7–10.2)
Chloride: 102 mmol/L (ref 96–106)
Creatinine, Ser: 0.72 mg/dL (ref 0.57–1.00)
Globulin, Total: 2.4 g/dL (ref 1.5–4.5)
Glucose: 70 mg/dL (ref 70–99)
Potassium: 4 mmol/L (ref 3.5–5.2)
Sodium: 140 mmol/L (ref 134–144)
Total Protein: 6.5 g/dL (ref 6.0–8.5)
eGFR: 97 mL/min/{1.73_m2} (ref >59)

## 2023-05-12 LAB — CK: Total CK: 186 U/L — ABNORMAL HIGH (ref 32–182)

## 2023-05-15 ENCOUNTER — Ambulatory Visit: Payer: Self-pay

## 2023-05-15 DIAGNOSIS — M791 Myalgia, unspecified site: Secondary | ICD-10-CM | POA: Insufficient documentation

## 2023-05-15 DIAGNOSIS — M79641 Pain in right hand: Secondary | ICD-10-CM | POA: Insufficient documentation

## 2023-05-15 DIAGNOSIS — D696 Thrombocytopenia, unspecified: Secondary | ICD-10-CM | POA: Insufficient documentation

## 2023-05-15 NOTE — Assessment & Plan Note (Signed)
Noted on Jan 2024 labs. Will recheck level today. Denies new bruising/bleeding.

## 2023-05-15 NOTE — Assessment & Plan Note (Signed)
Chronic, will check inflammatory arthritis panel. I will make further recommendations once her labs are available for review. Encouraged to follow anti-inflammatory diet.

## 2023-05-15 NOTE — Assessment & Plan Note (Addendum)
Chronic, currently followed by PM&R [facility]. Unfortunately, she has frequent loose stools.

## 2023-05-15 NOTE — Assessment & Plan Note (Addendum)
Chronic, intermittent. Will check CK levels. Encouraged to stay well hydrated. May benefit from every other day magnesium supplementation.

## 2023-05-15 NOTE — Assessment & Plan Note (Signed)
Chronic, ambulatory with crutches. Followed by PM[facility]R at [facility]. Most recent notes reviewed in detail.

## 2023-05-15 NOTE — Patient Instructions (Signed)
Visit Information  Thank you for taking time to visit with me today. Please don't hesitate to contact me if I can be of assistance to you.   Following are the goals we discussed today:   Goals Addressed             This Visit's Progress    I would like to keep my BP under good control   On track    Care Coordination Interventions: Evaluation of current treatment plan related to hypertension self management and patient's adherence to plan as established by provider Review of patient status, including review of consultant's reports, relevant laboratory and other test results, and medications completed Advised patient, providing education and rationale, to monitor blood pressure weekly and record, calling PCP for findings outside established parameters Reviewed and discussed with mother Berton Mount, patient's upcoming scheduled nurse visit at [facility] for a BP recheck set for 05/30/23 @11 :00 AM Last practice recorded BP readings:  BP Readings from Last 3 Encounters:  05/11/23 (!) 140/82  04/03/23 120/66  01/04/23 (!) 140/82   Most recent eGFR/CrCl:  Lab Results  Component Value Date   EGFR 97 05/11/2023    No components found for: "CRCL"      To have less muscle cramping   On track    Care Coordination Interventions: Evaluation of current treatment plan related to muscle cramps and patient's adherence to plan as established by provider Reviewed and discussed with mother Berton Mount, MD recommendations for patient to increase her water intake, reviewed muscle enzymes still elevated but improved Instructed mother to contact PCP to report new symptoms or concerns promptly         Our next appointment is by telephone on 07/17/23 at 12:00 PM  Please call the care guide team at [phone] if you need to cancel or reschedule your appointment.   If you are experiencing a Mental Health or Behavioral Health Crisis or need someone to talk to, please call 1-800-273-TALK (toll free, 24 hour  hotline)  Patient verbalizes understanding of instructions and care plan provided today and agrees to view in MyChart. Active MyChart status and patient understanding of how to access instructions and care plan via MyChart confirmed with patient.     Delsa Sale RN BSN CCM [facility] Nurse Care Coordinator  Direct Dial: [phone] Website: [email or web address]

## 2023-05-15 NOTE — Patient Outreach (Signed)
  Care Coordination   Follow Up Visit Note   05/15/2023 Name: MICHAELLA IMAI MRN: [id number] DOB: Jul 28, 1965  [name] is a 57 y.o. year old female who sees Dorothyann Peng, MD for primary care. I spoke with patient's mother Lashea Goda by phone today.  What matters to the patients health and wellness today?  Patient would like to have fewer muscle cramps.     Goals Addressed             This Visit's Progress    I would like to keep my BP under good control   On track    Care Coordination Interventions: Evaluation of current treatment plan related to hypertension self management and patient's adherence to plan as established by provider Review of patient status, including review of consultant's reports, relevant laboratory and other test results, and medications completed Advised patient, providing education and rationale, to monitor blood pressure weekly and record, calling PCP for findings outside established parameters Reviewed and discussed with mother Berton Mount, patient's upcoming scheduled nurse visit at [facility] for a BP recheck set for 05/30/23 @11 :00 AM Last practice recorded BP readings:  BP Readings from Last 3 Encounters:  05/11/23 (!) 140/82  04/03/23 120/66  01/04/23 (!) 140/82   Most recent eGFR/CrCl:  Lab Results  Component Value Date   EGFR 97 05/11/2023    No components found for: "CRCL"      To have less muscle cramping   On track    Care Coordination Interventions: Evaluation of current treatment plan related to muscle cramps and patient's adherence to plan as established by provider Reviewed and discussed with mother Berton Mount, MD recommendations for patient to increase her water intake, reviewed muscle enzymes still elevated but improved Instructed mother to contact PCP to report new symptoms or concerns promptly     Interventions Today    Flowsheet Row Most Recent Value  Chronic Disease   Chronic disease during today's visit Hypertension  (HTN)  General Interventions   General Interventions Discussed/Reviewed General Interventions Discussed, General Interventions Reviewed, Labs, Doctor Visits  Doctor Visits Discussed/Reviewed Doctor Visits Discussed, Doctor Visits Reviewed, PCP  Education Interventions   Education Provided Provided Education  Provided Verbal Education On Nutrition, Labs, When to see the doctor, Medication  Labs Reviewed Kidney Function  [CK,  Arthritis panel]  Nutrition Interventions   Nutrition Discussed/Reviewed Nutrition Discussed, Nutrition Reviewed, Fluid intake  Pharmacy Interventions   Pharmacy Dicussed/Reviewed Pharmacy Topics Reviewed, Pharmacy Topics Discussed, Medications and their functions          SDOH assessments and interventions completed:  No     Care Coordination Interventions:  Yes, provided   Follow up plan: Follow up call scheduled for 07/17/23 @12 :00 PM    Encounter Outcome:  Patient Visit Completed

## 2023-05-18 DIAGNOSIS — R339 Retention of urine, unspecified: Secondary | ICD-10-CM | POA: Diagnosis not present

## 2023-05-30 ENCOUNTER — Ambulatory Visit: Payer: 59

## 2023-05-30 VITALS — BP 110/78 | HR 88 | Temp 98.1°F | Ht <= 58 in | Wt 112.0 lb

## 2023-05-30 DIAGNOSIS — I10 Essential (primary) hypertension: Secondary | ICD-10-CM

## 2023-05-30 NOTE — Patient Instructions (Signed)
Hypertension, Adult Hypertension is another name for high blood pressure. High blood pressure forces your heart to work harder to pump blood. This can cause problems over time. There are two numbers in a blood pressure reading. There is a top number (systolic) over a bottom number (diastolic). It is best to have a blood pressure that is below 120/80. What are the causes? The cause of this condition is not known. Some other conditions can lead to high blood pressure. What increases the risk? Some lifestyle factors can make you more likely to develop high blood pressure: Smoking. Not getting enough exercise or physical activity. Being overweight. Having too much fat, sugar, calories, or salt (sodium) in your diet. Drinking too much alcohol. Other risk factors include: Having any of these conditions: Heart disease. Diabetes. High cholesterol. Kidney disease. Obstructive sleep apnea. Having a family history of high blood pressure and high cholesterol. Age. The risk increases with age. Stress. What are the signs or symptoms? High blood pressure may not cause symptoms. Very high blood pressure (hypertensive crisis) may cause: Headache. Fast or uneven heartbeats (palpitations). Shortness of breath. Nosebleed. Vomiting or feeling like you may vomit (nauseous). Changes in how you see. Very bad chest pain. Feeling dizzy. Seizures. How is this treated? This condition is treated by making healthy lifestyle changes, such as: Eating healthy foods. Exercising more. Drinking less alcohol. Your doctor may prescribe medicine if lifestyle changes do not help enough and if: Your top number is above 130. Your bottom number is above 80. Your personal target blood pressure may vary. Follow these instructions at home: Eating and drinking  If told, follow the DASH eating plan. To follow this plan: Fill one half of your plate at each meal with fruits and vegetables. Fill one fourth of your plate  at each meal with whole grains. Whole grains include whole-wheat pasta, brown rice, and whole-grain bread. Eat or drink low-fat dairy products, such as skim milk or low-fat yogurt. Fill one fourth of your plate at each meal with low-fat (lean) proteins. Low-fat proteins include fish, chicken without skin, eggs, beans, and tofu. Avoid fatty meat, cured and processed meat, or chicken with skin. Avoid pre-made or processed food. Limit the amount of salt in your diet to less than 1,500 mg each day. Do not drink alcohol if: Your doctor tells you not to drink. You are pregnant, may be pregnant, or are planning to become pregnant. If you drink alcohol: Limit how much you have to: 0-1 drink a day for women. 0-2 drinks a day for men. Know how much alcohol is in your drink. In the [place] one drink equals one 12 oz bottle of beer (355 mL), one 5 oz glass of wine (148 mL), or one 1 oz glass of hard liquor (44 mL). Lifestyle  Work with your doctor to stay at a healthy weight or to lose weight. Ask your doctor what the best weight is for you. Get at least 30 minutes of exercise that causes your heart to beat faster (aerobic exercise) most days of the week. This may include walking, swimming, or biking. Get at least 30 minutes of exercise that strengthens your muscles (resistance exercise) at least 3 days a week. This may include lifting weights or doing Pilates. Do not smoke or use any products that contain nicotine or tobacco. If you need help quitting, ask your doctor. Check your blood pressure at home as told by your doctor. Keep all follow-up visits. Medicines Take over-the-counter and prescription medicines   only as told by your doctor. Follow directions carefully. Do not skip doses of blood pressure medicine. The medicine does not work as well if you skip doses. Skipping doses also puts you at risk for problems. Ask your doctor about side effects or reactions to medicines that you should watch  for. Contact a doctor if: You think you are having a reaction to the medicine you are taking. You have headaches that keep coming back. You feel dizzy. You have swelling in your ankles. You have trouble with your vision. Get help right away if: You get a very bad headache. You start to feel mixed up (confused). You feel weak or numb. You feel faint. You have very bad pain in your: Chest. Belly (abdomen). You vomit more than once. You have trouble breathing. These symptoms may be an emergency. Get help right away. Call 911. Do not wait to see if the symptoms will go away. Do not drive yourself to the hospital. Summary Hypertension is another name for high blood pressure. High blood pressure forces your heart to work harder to pump blood. For most people, a normal blood pressure is less than 120/80. Making healthy choices can help lower blood pressure. If your blood pressure does not get lower with healthy choices, you may need to take medicine. This information is not intended to replace advice given to you by your health care provider. Make sure you discuss any questions you have with your health care provider. Document Revised: 04/15/2021 Document Reviewed: 04/15/2021 Elsevier Patient Education  2024 Elsevier Inc.  

## 2023-05-30 NOTE — Progress Notes (Signed)
Patient presents today for bpc. She currently takes Amlodipine 2.5MG  she reports taking at night & Valsartan 160MG  she reports taking in the morning. Initial bp: 140/70. Bp taken again after 10 minutes:  BP Readings from Last 3 Encounters:  05/30/23 110/78  05/11/23 (!) 140/82  04/03/23 120/66  Per provider patient will follow up at March appointment. Continue with current regimen. Patient aware.

## 2023-06-12 DIAGNOSIS — K592 Neurogenic bowel, not elsewhere classified: Secondary | ICD-10-CM | POA: Diagnosis not present

## 2023-06-12 DIAGNOSIS — R252 Cramp and spasm: Secondary | ICD-10-CM | POA: Diagnosis not present

## 2023-06-12 DIAGNOSIS — K5900 Constipation, unspecified: Secondary | ICD-10-CM | POA: Diagnosis not present

## 2023-06-12 DIAGNOSIS — Q057 Lumbar spina bifida without hydrocephalus: Secondary | ICD-10-CM | POA: Diagnosis not present

## 2023-06-21 DIAGNOSIS — Z96652 Presence of left artificial knee joint: Secondary | ICD-10-CM | POA: Diagnosis not present

## 2023-06-21 DIAGNOSIS — Z96641 Presence of right artificial hip joint: Secondary | ICD-10-CM | POA: Diagnosis not present

## 2023-06-21 DIAGNOSIS — Z471 Aftercare following joint replacement surgery: Secondary | ICD-10-CM | POA: Diagnosis not present

## 2023-06-26 DIAGNOSIS — R339 Retention of urine, unspecified: Secondary | ICD-10-CM | POA: Diagnosis not present

## 2023-07-11 ENCOUNTER — Ambulatory Visit: Payer: 59

## 2023-07-11 VITALS — BP 130/80 | Temp 98.1°F | Ht <= 58 in | Wt 112.0 lb

## 2023-07-11 DIAGNOSIS — I10 Essential (primary) hypertension: Secondary | ICD-10-CM

## 2023-07-11 NOTE — Progress Notes (Signed)
 Patient presents today for bpc. She currently takes Amlodipine  5MG  she takes at night & Valsartan  160 MG in the morning. Denies headache, chest pain & sob. She admits exercising regularly. 3 days a weeks for 30 minutes. Initial bp: 142/80. Bp taken again after 10 minutes:  BP Readings from Last 3 Encounters:  07/11/23 130/80  05/30/23 110/78  05/11/23 (!) 140/82   Per provider patient will follow up at March appointment. Patient aware to continue with current regimen.

## 2023-07-11 NOTE — Patient Instructions (Signed)
 Hypertension, Adult Hypertension is another name for high blood pressure. High blood pressure forces your heart to work harder to pump blood. This can cause problems over time. There are two numbers in a blood pressure reading. There is a top number (systolic) over a bottom number (diastolic). It is best to have a blood pressure that is below 120/80. What are the causes? The cause of this condition is not known. Some other conditions can lead to high blood pressure. What increases the risk? Some lifestyle factors can make you more likely to develop high blood pressure: Smoking. Not getting enough exercise or physical activity. Being overweight. Having too much fat, sugar, calories, or salt (sodium) in your diet. Drinking too much alcohol. Other risk factors include: Having any of these conditions: Heart disease. Diabetes. High cholesterol. Kidney disease. Obstructive sleep apnea. Having a family history of high blood pressure and high cholesterol. Age. The risk increases with age. Stress. What are the signs or symptoms? High blood pressure may not cause symptoms. Very high blood pressure (hypertensive crisis) may cause: Headache. Fast or uneven heartbeats (palpitations). Shortness of breath. Nosebleed. Vomiting or feeling like you may vomit (nauseous). Changes in how you see. Very bad chest pain. Feeling dizzy. Seizures. How is this treated? This condition is treated by making healthy lifestyle changes, such as: Eating healthy foods. Exercising more. Drinking less alcohol. Your doctor may prescribe medicine if lifestyle changes do not help enough and if: Your top number is above 130. Your bottom number is above 80. Your personal target blood pressure may vary. Follow these instructions at home: Eating and drinking  If told, follow the DASH eating plan. To follow this plan: Fill one half of your plate at each meal with fruits and vegetables. Fill one fourth of your plate  at each meal with whole grains. Whole grains include whole-wheat pasta, brown rice, and whole-grain bread. Eat or drink low-fat dairy products, such as skim milk or low-fat yogurt. Fill one fourth of your plate at each meal with low-fat (lean) proteins. Low-fat proteins include fish, chicken without skin, eggs, beans, and tofu. Avoid fatty meat, cured and processed meat, or chicken with skin. Avoid pre-made or processed food. Limit the amount of salt in your diet to less than 1,500 mg each day. Do not drink alcohol if: Your doctor tells you not to drink. You are pregnant, may be pregnant, or are planning to become pregnant. If you drink alcohol: Limit how much you have to: 0-1 drink a day for women. 0-2 drinks a day for men. Know how much alcohol is in your drink. In the [place] one drink equals one 12 oz bottle of beer (355 mL), one 5 oz glass of wine (148 mL), or one 1 oz glass of hard liquor (44 mL). Lifestyle  Work with your doctor to stay at a healthy weight or to lose weight. Ask your doctor what the best weight is for you. Get at least 30 minutes of exercise that causes your heart to beat faster (aerobic exercise) most days of the week. This may include walking, swimming, or biking. Get at least 30 minutes of exercise that strengthens your muscles (resistance exercise) at least 3 days a week. This may include lifting weights or doing Pilates. Do not smoke or use any products that contain nicotine or tobacco. If you need help quitting, ask your doctor. Check your blood pressure at home as told by your doctor. Keep all follow-up visits. Medicines Take over-the-counter and prescription medicines  only as told by your doctor. Follow directions carefully. Do not skip doses of blood pressure medicine. The medicine does not work as well if you skip doses. Skipping doses also puts you at risk for problems. Ask your doctor about side effects or reactions to medicines that you should watch  for. Contact a doctor if: You think you are having a reaction to the medicine you are taking. You have headaches that keep coming back. You feel dizzy. You have swelling in your ankles. You have trouble with your vision. Get help right away if: You get a very bad headache. You start to feel mixed up (confused). You feel weak or numb. You feel faint. You have very bad pain in your: Chest. Belly (abdomen). You vomit more than once. You have trouble breathing. These symptoms may be an emergency. Get help right away. Call 911. Do not wait to see if the symptoms will go away. Do not drive yourself to the hospital. Summary Hypertension is another name for high blood pressure. High blood pressure forces your heart to work harder to pump blood. For most people, a normal blood pressure is less than 120/80. Making healthy choices can help lower blood pressure. If your blood pressure does not get lower with healthy choices, you may need to take medicine. This information is not intended to replace advice given to you by your health care provider. Make sure you discuss any questions you have with your health care provider. Document Revised: 04/15/2021 Document Reviewed: 04/15/2021 Elsevier Patient Education  2024 [employer].

## 2023-07-14 ENCOUNTER — Other Ambulatory Visit: Payer: Self-pay | Admitting: Internal Medicine

## 2023-07-17 ENCOUNTER — Ambulatory Visit: Payer: Self-pay

## 2023-07-17 NOTE — Patient Instructions (Signed)
 Visit Information  Thank you for taking time to visit with me today. Please don't hesitate to contact me if I can be of assistance to you.   Following are the goals we discussed today:   Goals Addressed             This Visit's Progress    I would like to keep my BP under good control   On track    Care Coordination Interventions: Evaluation of current treatment plan related to hypertension self management and patient's adherence to plan as established by provider Review of patient status, including review of consultant's reports, relevant laboratory and other test results, and medications completed Advised patient, providing education and rationale, to monitor blood pressure weekly and record, calling PCP for findings outside established parameters Last practice recorded BP readings:  BP Readings from Last 3 Encounters:  07/11/23 130/80  05/30/23 110/78  05/11/23 (!) 140/82   Most recent eGFR/CrCl:  Lab Results  Component Value Date   EGFR 97 05/11/2023    No components found for: CRCL        To have less muscle cramping   On track    Care Coordination Interventions: Evaluation of current treatment plan related to muscle cramps and patient's adherence to plan as established by provider Reviewed and discussed recent follow up visit with Dr. Nick with [facility] for evaluation of spina bifida  Review of patient status, including review of consultant's reports, relevant laboratory and other test results, and medications completed TREATMENT PLAN:  -- We will contact [facility] to have them fax the report of her KUB ordered after our last visit since I am not seeing her x-ray report in the system. I will contact the family once the imaging is available regarding results/plan.  -- She will continue to stay hydrated as this should help with cramping and bowels/risk of UTIs.  -- They will ask her pcp to send her last visit notes and labs. I do recommend  that they have her electrolytes, Vit B12, TSH and Vit D levels checked if not done recently in setting of her intermittent arm/leg cramping. -- They will contact me if any equipment needs or additional concerns arise.  -- If any new weakness or numbness/tingling occurs, she should contact me right away.  -- Consider EMG/nerve study to further evaluate hand/arm symptoms vs MRI c-spine but no report of weakness or paresthesias.   NEXT STEPS/FOLLOW UP: Follow-up in 6 months, and patient will call or message provider should anything change or any new issues arise.   Determined patient and mother Lillian verbalize understanding of patient's prescribed treatment plan Reviewed and discussed patient's next scheduled PCP follow up with Dr. Catheryn Slocumb, scheduled for 09/20/23 @2 :40 PM Discussed plans with patient for ongoing care coordination follow up and provided patient with direct contact information for nurse care coordinator          Our next appointment is by telephone on 09/25/23 at 12:30 PM  Please call the care guide team at [phone] if you need to cancel or reschedule your appointment.   If you are experiencing a Mental Health or Behavioral Health Crisis or need someone to talk to, please call 1-800-273-TALK (toll free, 24 hour hotline)  Patient verbalizes understanding of instructions and care plan provided today and agrees to view in MyChart. Active MyChart status and patient understanding of how to access instructions and care plan via MyChart confirmed with patient.     Lynett Brasil  RN BSN CCM [place]  [facility], [facility] Nurse Care Coordinator  Direct Dial: [phone] Website: [email or web address]

## 2023-07-17 NOTE — Patient Outreach (Signed)
 Care Coordination   Follow Up Visit Note   07/17/2023 Name: Mary Neal MRN: [id number] DOB: [birth year]  Mary Neal is a 58 y.o. year old female who sees Jarold Medici, MD for primary care. I spoke with Mary Neal and [name] by phone today.  What matters to the patients health and wellness today?   Patient would like to have fewer muscle cramps. She would like to increase her daily water intake without having issues with incontinence.      Goals Addressed             This Visit's Progress    I would like to keep my BP under good control   On track    Care Coordination Interventions: Evaluation of current treatment plan related to hypertension self management and patient's adherence to plan as established by provider Review of patient status, including review of consultant's reports, relevant laboratory and other test results, and medications completed Advised patient, providing education and rationale, to monitor blood pressure weekly and record, calling PCP for findings outside established parameters Last practice recorded BP readings:  BP Readings from Last 3 Encounters:  07/11/23 130/80  05/30/23 110/78  05/11/23 (!) 140/82   Most recent eGFR/CrCl:  Lab Results  Component Value Date   EGFR 97 05/11/2023    No components found for: CRCL        To have less muscle cramping   On track    Care Coordination Interventions: Evaluation of current treatment plan related to muscle cramps and patient's adherence to plan as established by provider Reviewed and discussed recent follow up visit with Dr. Nick with [facility] for evaluation of spina bifida  Review of patient status, including review of consultant's reports, relevant laboratory and other test results, and medications completed TREATMENT PLAN:  -- We will contact [facility] to have them fax the report of her KUB ordered after our last visit since I am  not seeing her x-ray report in the system. I will contact the family once the imaging is available regarding results/plan.  -- She will continue to stay hydrated as this should help with cramping and bowels/risk of UTIs.  -- They will ask her pcp to send her last visit notes and labs. I do recommend that they have her electrolytes, Vit B12, TSH and Vit D levels checked if not done recently in setting of her intermittent arm/leg cramping. -- They will contact me if any equipment needs or additional concerns arise.  -- If any new weakness or numbness/tingling occurs, she should contact me right away.  -- Consider EMG/nerve study to further evaluate hand/arm symptoms vs MRI c-spine but no report of weakness or paresthesias.   NEXT STEPS/FOLLOW UP: Follow-up in 6 months, and patient will call or message provider should anything change or any new issues arise.   Determined patient and [name] verbalize understanding of patient's prescribed treatment plan Reviewed and discussed patient's next scheduled PCP follow up with Dr. Medici Jarold, scheduled for 09/20/23 @2 :40 PM Discussed plans with patient for ongoing care coordination follow up and provided patient with direct contact information for nurse care coordinator      Interventions Today    Flowsheet Row Most Recent Value  Chronic Disease   Chronic disease during today's visit Other, Hypertension (HTN)  [muscle cramps, impaired physical mobility]  General Interventions   General Interventions Discussed/Reviewed General Interventions Reviewed, General Interventions Discussed, Doctor Visits, Labs  Doctor Visits Discussed/Reviewed  Doctor Visits Reviewed, Doctor Visits Discussed, PCP, Specialist  Education Interventions   Education Provided Provided Education  Provided Verbal Education On When to see the doctor, Nutrition, Labs, Medication  Nutrition Interventions   Nutrition Discussed/Reviewed Nutrition Discussed, Nutrition Reviewed,  Fluid intake  Pharmacy Interventions   Pharmacy Dicussed/Reviewed Pharmacy Topics Discussed, Pharmacy Topics Reviewed, Medications and their functions  Safety Interventions   Safety Discussed/Reviewed Fall Risk, Home Safety, Safety Reviewed, Safety Discussed  Home Safety Assistive Devices          SDOH assessments and interventions completed:  Yes  SDOH Interventions Today    Flowsheet Row Most Recent Value  SDOH Interventions   Food Insecurity Interventions Intervention Not Indicated  Housing Interventions Intervention Not Indicated  Transportation Interventions Intervention Not Indicated  Utilities Interventions Intervention Not Indicated        Care Coordination Interventions:  Yes, provided   Follow up plan: Follow up call scheduled for 09/25/23 @12 :30 PM    Encounter Outcome:  Patient Visit Completed

## 2023-07-28 DIAGNOSIS — R339 Retention of urine, unspecified: Secondary | ICD-10-CM | POA: Diagnosis not present

## 2023-08-02 ENCOUNTER — Ambulatory Visit (INDEPENDENT_AMBULATORY_CARE_PROVIDER_SITE_OTHER): Payer: 59

## 2023-08-02 DIAGNOSIS — Z0000 Encounter for general adult medical examination without abnormal findings: Secondary | ICD-10-CM | POA: Diagnosis not present

## 2023-08-02 NOTE — Progress Notes (Signed)
Subjective:   Mary Neal is a 58 y.o. female who presents for Medicare Annual (Subsequent) preventive examination.  Visit Complete: Virtual I connected with  Mary Neal on 08/02/23 by a audio enabled telemedicine application and verified that I am speaking with the correct person using two identifiers.  Interactive audio and video telecommunications were attempted between this provider and patient, however failed, due to patient having technical difficulties OR patient did not have access to video capability.  We continued and completed visit with audio only.  Patient Location: Home  Provider Location: Office/Clinic  I discussed the limitations of evaluation and management by telemedicine. The patient expressed understanding and agreed to proceed.  Vital Signs: Because this visit was a virtual/telehealth visit, some criteria may be missing or patient reported. Any vitals not documented were not able to be obtained and vitals that have been documented are patient reported.    Cardiac Risk Factors include: advanced age (>76men, >15 women);hypertension     Objective:    Today's Vitals   There is no height or weight on file to calculate BMI.     08/02/2023    2:58 PM 07/13/2022   11:00 AM 07/07/2021   11:21 AM 07/01/2020   11:26 AM 06/27/2019   11:29 AM 01/11/2016    1:09 PM 10/14/2015    3:40 PM  Advanced Directives  Does Patient Have a Medical Advance Directive? Yes Yes Yes No Yes No No  Type of Sales promotion account executive of [employer] Power of [place];Living will  Healthcare Power of [place];Living will    Copy of Healthcare Power of Attorney in Chart? No - copy requested No - copy requested No - copy requested  No - copy requested    Would patient like information on creating a medical advance directive?      Yes - Educational materials given Yes - Transport planner given    Current Medications  (verified) Outpatient Encounter Medications as of 08/02/2023  Medication Sig   amLODipine (NORVASC) 2.5 MG tablet Take 1 tablet (2.5 mg total) by mouth daily.   azithromycin (ZITHROMAX) 250 MG tablet TAKE 2 TABLETS (500 MG) X 1 HOUR PRIOR TO PROCEDURE   Digestive Enzymes (SUPER PAPAYA PLUS PO) Take 1 capsule by mouth daily at 2 am.   famotidine (PEPCID) 20 MG tablet TAKE 1 TABLET BY MOUTH DAILY   Magnesium 300 MG CAPS Take by mouth.   meloxicam (MOBIC) 7.5 MG tablet Take 1 tablet by mouth x 5 days then one tab daily as needed   Multiple Vitamins-Minerals (MULTIVITAMIN GUMMIES WOMENS PO) Take by mouth daily. 2 gummies daily   oxybutynin (DITROPAN XL) 15 MG 24 hr tablet TAKE 1 TABLET BY MOUTH TWICE  DAILY   valsartan (DIOVAN) 160 MG tablet TAKE 1 TABLET BY MOUTH DAILY   vitamin C (ASCORBIC ACID) 500 MG tablet Take by mouth.   diclofenac Sodium (VOLTAREN) 1 % GEL APPLY 4 G TOPICALLY 3 (THREE) TIMES DAILY AS NEEDED. (Patient not taking: Reported on 08/02/2023)   tobramycin-dexamethasone (TOBRADEX) ophthalmic ointment Place 1 application into the right eye 3 (three) times daily. (Patient not taking: Reported on 07/13/2022)   UNABLE TO FIND 1 capsule daily at 2 am. Med Name: enzyme plus (Patient not taking: Reported on 08/02/2023)   No facility-administered encounter medications on file as of 08/02/2023.    Allergies (verified) Clindamycin/lincomycin, Latex, and Penicillins   History: Past Medical History:  Diagnosis Date   Abnormal Pap smear  2013   Dislocation of right hip (HCC) 06/09/2018   Per MRI   History of knee replacement 2005   Spina bifida [facility])    Uterine prolapse    Wears glasses    Past Surgical History:  Procedure Laterality Date   TOTAL HIP ARTHROPLASTY Right 12/2018   Family History  Problem Relation Age of Onset   Hypertension Mother    Hyperlipidemia Mother    Heart attack Brother    Social History   Socioeconomic History   Marital status: Single    Spouse name:  Not on file   Number of children: 0   Years of education: Not on file   Highest education level: Not on file  Occupational History   Occupation: unemployed  Tobacco Use   Smoking status: Never   Smokeless tobacco: Never  Vaping Use   Vaping status: Never Used  Substance and Sexual Activity   Alcohol use: No    Comment: wine sometimes   Drug use: No   Sexual activity: Never    Birth control/protection: None  Other Topics Concern   Not on file  Social History Narrative   ** Merged History Encounter **       Social Drivers of Health   Financial Resource Strain: Low Risk  (08/02/2023)   Overall Financial Resource Strain (CARDIA)    Difficulty of Paying Living Expenses: Not hard at all  Food Insecurity: No Food Insecurity (08/02/2023)   Hunger Vital Sign    Worried About Running Out of Food in the Last Year: Never true    Ran Out of Food in the Last Year: Never true  Transportation Needs: No Transportation Needs (08/02/2023)   PRAPARE - Administrator, Civil Service (Medical): No    Lack of Transportation (Non-Medical): No  Recent Concern: Transportation Needs - Unmet Transportation Needs (08/02/2023)   PRAPARE - Transportation    Lack of Transportation (Medical): No    Lack of Transportation (Non-Medical): Yes  Physical Activity: Inactive (08/02/2023)   Exercise Vital Sign    Days of Exercise per Week: 0 days    Minutes of Exercise per Session: 0 min  Stress: No Stress Concern Present (08/02/2023)   [employer] of Occupational Health - Occupational Stress Questionnaire    Feeling of Stress : Not at all  Social Connections: Moderately Integrated (08/02/2023)   Social Connection and Isolation Panel [NHANES]    Frequency of Communication with Friends and Family: More than three times a week    Frequency of Social Gatherings with Friends and Family: Never    Attends Religious Services: More than 4 times per year    Active Member of [employer] or Organizations: Yes     Attends Engineer, structural: More than 4 times per year    Marital Status: Never married    Tobacco Counseling Counseling given: Not Answered   Clinical Intake:  Pre-visit preparation completed: Yes  Pain : No/denies pain     Nutritional Risks: None Diabetes: No  How often do you need to have someone help you when you read instructions, pamphlets, or other written materials from your doctor or pharmacy?: 1 - Never  Interpreter Needed?: No  Information entered by :: NAllen LPN   Activities of Daily Living    08/02/2023    2:46 PM  In your present state of health, do you have any difficulty performing the following activities:  Hearing? 0  Vision? 0  Difficulty concentrating or making decisions? 0  Walking or climbing stairs? 1  Dressing or bathing? 1  Comment has spina bifida  Doing errands, shopping? 1  Preparing Food and eating ? N  Using the Toilet? N  In the past six months, have you accidently leaked urine? Y  Comment self catherization  Do you have problems with loss of bowel control? Y  Comment sometimes  Managing your Medications? N  Managing your Finances? N  Housekeeping or managing your Housekeeping? N    Patient Care Team: Dorothyann Peng, MD as PCP - General (Internal Medicine) Clarene Duke, Karma Lew, RN as VBCI Care Management  Indicate any recent Medical Services you may have received from other than [facility] providers in the past year (date may be approximate).     Assessment:   This is a routine wellness examination for [place].  Hearing/Vision screen Hearing Screening - Comments:: Denies hearing issues Vision Screening - Comments:: Regular eye exams, [facility]   Goals Addressed             This Visit's Progress    Patient Stated       08/02/2023, stay healthy       Depression Screen    08/02/2023    3:00 PM 08/22/2022   10:52 AM 07/13/2022   11:00 AM 07/07/2021   11:22 AM 08/12/2020    8:25 AM 07/01/2020   11:28 AM  01/06/2020    5:45 PM  PHQ 2/9 Scores  PHQ - 2 Score 0 0 0 0 0 0 0  PHQ- 9 Score     0      Fall Risk    08/02/2023    2:59 PM 05/11/2023   11:28 AM 08/22/2022   10:52 AM 07/13/2022   11:00 AM 07/07/2021   11:22 AM  Fall Risk   Falls in the past year? 1 0 0 1 0  Comment loses balance      Number falls in past yr: 1 0 0 1   Injury with Fall? 0 0 0 0   Risk for fall due to : History of fall(s);Impaired balance/gait;Impaired mobility;Medication side effect No Fall Risks No Fall Risks Impaired balance/gait;Impaired mobility;Medication side effect Impaired balance/gait;Impaired mobility;Medication side effect  Follow up Falls prevention discussed;Falls evaluation completed Falls evaluation completed Falls evaluation completed Falls evaluation completed;Education provided;Falls prevention discussed Falls evaluation completed;Education provided;Falls prevention discussed    MEDICARE RISK AT HOME: Medicare Risk at Home Any stairs in or around the home?: No (ramp) If so, are there any without handrails?: No Home free of loose throw rugs in walkways, pet beds, electrical cords, etc?: Yes Adequate lighting in your home to reduce risk of falls?: Yes Life alert?: No Use of a cane, walker or w/c?: Yes Grab bars in the bathroom?: Yes Shower chair or bench in shower?: Yes Elevated toilet seat or a handicapped toilet?: No  TIMED UP AND GO:  Was the test performed?  No    Cognitive Function:        08/02/2023    3:00 PM 07/13/2022   11:01 AM 07/07/2021   11:23 AM 07/01/2020   11:29 AM 06/27/2019   11:32 AM  6CIT Screen  What Year? 0 points 0 points 0 points 0 points 0 points  What month? 0 points 0 points 0 points 0 points 0 points  What time? 3 points 0 points 0 points 0 points 0 points  Count back from 20 0 points 4 points 0 points 4 points 0 points  Months in reverse 4  points 4 points 4 points 4 points 4 points  Repeat phrase 6 points 4 points 8 points 0 points 10 points  Total Score  13 points 12 points 12 points 8 points 14 points    Immunizations Immunization History  Administered Date(s) Administered   Influenza, Seasonal, Injecte, Preservative Fre 05/11/2023   Influenza,inj,Quad PF,6+ Mos 06/19/2018, 04/15/2019, 04/08/2020, 05/16/2021, 07/13/2022   PFIZER(Purple Top)SARS-COV-2 Vaccination 09/24/2019, 10/15/2019, 07/13/2020   PNEUMOCOCCAL CONJUGATE-20 07/07/2021   Tdap 05/07/2019   Zoster Recombinant(Shingrix) 12/24/2020, 03/04/2021    TDAP status: Up to date  Flu Vaccine status: Up to date  Pneumococcal vaccine status: Up to date  Covid-19 vaccine status: Information provided on how to obtain vaccines.   Qualifies for Shingles Vaccine? Yes   Zostavax completed Yes   Shingrix Completed?: Yes  Screening Tests Health Maintenance  Topic Date Due   COVID-19 Vaccine (4 - 2024-25 season) 03/12/2023   HIV Screening  05/10/2024 (Originally 02/02/1981)   MAMMOGRAM  08/04/2023   Medicare Annual Wellness (AWV)  08/01/2024   Colonoscopy  05/19/2025   Cervical Cancer Screening (HPV/Pap Cotest)  08/10/2025   DTaP/Tdap/Td (2 - Td or Tdap) 05/06/2029   Pneumococcal Vaccine 62-86 Years old  Completed   INFLUENZA VACCINE  Completed   Hepatitis C Screening  Completed   Zoster Vaccines- Shingrix  Completed   HPV VACCINES  Aged Out    Health Maintenance  Health Maintenance Due  Topic Date Due   COVID-19 Vaccine (4 - 2024-25 season) 03/12/2023    Colorectal cancer screening: Type of screening: Colonoscopy. Completed 05/20/2015. Repeat every 10 years  Mammogram status: Completed 08/03/2022. Repeat every year  Bone Density status: n/a  Lung Cancer Screening: (Low Dose CT Chest recommended if Age 16-80 years, 20 pack-year currently smoking OR have quit w/in 15years.) does not qualify.   Lung Cancer Screening Referral: no  Additional Screening:  Hepatitis C Screening: does qualify; Completed 01/07/2020  Vision Screening: Recommended annual ophthalmology exams  for early detection of glaucoma and other disorders of the eye. Is the patient up to date with their annual eye exam?  Yes  Who is the provider or what is the name of the office in which the patient attends annual eye exams? [facility] If pt is not established with a provider, would they like to be referred to a provider to establish care? No .   Dental Screening: Recommended annual dental exams for proper oral hygiene  Diabetic Foot Exam: n/a  Community Resource Referral / Chronic Care Management: CRR required this visit?  No   CCM required this visit?  No     Plan:     I have personally reviewed and noted the following in the patient's chart:   Medical and social history Use of alcohol, tobacco or illicit drugs  Current medications and supplements including opioid prescriptions. Patient is not currently taking opioid prescriptions. Functional ability and status Nutritional status Physical activity Advanced directives List of other physicians Hospitalizations, surgeries, and ER visits in previous 12 months Vitals Screenings to include cognitive, depression, and falls Referrals and appointments  In addition, I have reviewed and discussed with patient certain preventive protocols, quality metrics, and best practice recommendations. A written personalized care plan for preventive services as well as general preventive health recommendations were provided to patient.     Barb Merino, LPN   0/04/2724   After Visit Summary: (MyChart) Due to this being a telephonic visit, the after visit summary with patients personalized plan was  offered to patient via MyChart   Nurse Notes: none

## 2023-08-02 NOTE — Patient Instructions (Signed)
Mary Neal , Thank you for taking time to come for your Medicare Wellness Visit. I appreciate your ongoing commitment to your health goals. Please review the following plan we discussed and let me know if I can assist you in the future.   Referrals/Orders/Follow-Ups/Clinician Recommendations: none  This is a list of the screening recommended for you and due dates:  Health Maintenance  Topic Date Due   COVID-19 Vaccine (4 - 2024-25 season) 03/12/2023   HIV Screening  05/10/2024*   Mammogram  08/04/2023   Medicare Annual Wellness Visit  08/01/2024   Colon Cancer Screening  05/19/2025   Pap with HPV screening  08/10/2025   DTaP/Tdap/Td vaccine (2 - Td or Tdap) 05/06/2029   Pneumococcal Vaccination  Completed   Flu Shot  Completed   Hepatitis C Screening  Completed   Zoster (Shingles) Vaccine  Completed   HPV Vaccine  Aged Out  *Topic was postponed. The date shown is not the original due date.    Advanced directives: (Copy Requested) Please bring a copy of your health care power of attorney and living will to the office to be added to your chart at your convenience.  Next Medicare Annual Wellness Visit scheduled for next year: Yes  insert Preventive Care attachment Insert FALL PREVENTION attachment if needed

## 2023-08-09 ENCOUNTER — Other Ambulatory Visit: Payer: 59

## 2023-08-09 ENCOUNTER — Other Ambulatory Visit: Payer: Self-pay

## 2023-08-09 DIAGNOSIS — L648 Other androgenic alopecia: Secondary | ICD-10-CM

## 2023-08-09 DIAGNOSIS — Z111 Encounter for screening for respiratory tuberculosis: Secondary | ICD-10-CM

## 2023-08-13 LAB — QUANTIFERON-TB GOLD PLUS
QuantiFERON Mitogen Value: >10 [IU]/mL
QuantiFERON Nil Value: 0.05 [IU]/mL
QuantiFERON TB1 Ag Value: 0.04 [IU]/mL
QuantiFERON TB2 Ag Value: 0.04 [IU]/mL
QuantiFERON-TB Gold Plus: NEGATIVE

## 2023-09-06 DIAGNOSIS — R339 Retention of urine, unspecified: Secondary | ICD-10-CM | POA: Diagnosis not present

## 2023-09-06 DIAGNOSIS — Z1231 Encounter for screening mammogram for malignant neoplasm of breast: Secondary | ICD-10-CM | POA: Diagnosis not present

## 2023-09-06 LAB — HM MAMMOGRAPHY

## 2023-09-07 ENCOUNTER — Encounter: Payer: Self-pay | Admitting: Internal Medicine

## 2023-09-18 ENCOUNTER — Encounter: Payer: Self-pay | Admitting: Internal Medicine

## 2023-09-18 ENCOUNTER — Ambulatory Visit: Admitting: Internal Medicine

## 2023-09-18 VITALS — BP 134/78 | HR 70 | Temp 97.7°F | Ht <= 58 in | Wt 112.8 lb

## 2023-09-18 DIAGNOSIS — I10 Essential (primary) hypertension: Secondary | ICD-10-CM

## 2023-09-18 DIAGNOSIS — Q059 Spina bifida, unspecified: Secondary | ICD-10-CM

## 2023-09-18 DIAGNOSIS — K219 Gastro-esophageal reflux disease without esophagitis: Secondary | ICD-10-CM | POA: Diagnosis not present

## 2023-09-18 DIAGNOSIS — L648 Other androgenic alopecia: Secondary | ICD-10-CM

## 2023-09-18 NOTE — Patient Instructions (Signed)
 Hypertension, Adult Hypertension is another name for high blood pressure. High blood pressure forces your heart to work harder to pump blood. This can cause problems over time. There are two numbers in a blood pressure reading. There is a top number (systolic) over a bottom number (diastolic). It is best to have a blood pressure that is below 120/80. What are the causes? The cause of this condition is not known. Some other conditions can lead to high blood pressure. What increases the risk? Some lifestyle factors can make you more likely to develop high blood pressure: Smoking. Not getting enough exercise or physical activity. Being overweight. Having too much fat, sugar, calories, or salt (sodium) in your diet. Drinking too much alcohol. Other risk factors include: Having any of these conditions: Heart disease. Diabetes. High cholesterol. Kidney disease. Obstructive sleep apnea. Having a family history of high blood pressure and high cholesterol. Age. The risk increases with age. Stress. What are the signs or symptoms? High blood pressure may not cause symptoms. Very high blood pressure (hypertensive crisis) may cause: Headache. Fast or uneven heartbeats (palpitations). Shortness of breath. Nosebleed. Vomiting or feeling like you may vomit (nauseous). Changes in how you see. Very bad chest pain. Feeling dizzy. Seizures. How is this treated? This condition is treated by making healthy lifestyle changes, such as: Eating healthy foods. Exercising more. Drinking less alcohol. Your doctor may prescribe medicine if lifestyle changes do not help enough and if: Your top number is above 130. Your bottom number is above 80. Your personal target blood pressure may vary. Follow these instructions at home: Eating and drinking  If told, follow the DASH eating plan. To follow this plan: Fill one half of your plate at each meal with fruits and vegetables. Fill one fourth of your plate  at each meal with whole grains. Whole grains include whole-wheat pasta, brown rice, and whole-grain bread. Eat or drink low-fat dairy products, such as skim milk or low-fat yogurt. Fill one fourth of your plate at each meal with low-fat (lean) proteins. Low-fat proteins include fish, chicken without skin, eggs, beans, and tofu. Avoid fatty meat, cured and processed meat, or chicken with skin. Avoid pre-made or processed food. Limit the amount of salt in your diet to less than 1,500 mg each day. Do not drink alcohol if: Your doctor tells you not to drink. You are pregnant, may be pregnant, or are planning to become pregnant. If you drink alcohol: Limit how much you have to: 0-1 drink a day for women. 0-2 drinks a day for men. Know how much alcohol is in your drink. In the [place] one drink equals one 12 oz bottle of beer (355 mL), one 5 oz glass of wine (148 mL), or one 1 oz glass of hard liquor (44 mL). Lifestyle  Work with your doctor to stay at a healthy weight or to lose weight. Ask your doctor what the best weight is for you. Get at least 30 minutes of exercise that causes your heart to beat faster (aerobic exercise) most days of the week. This may include walking, swimming, or biking. Get at least 30 minutes of exercise that strengthens your muscles (resistance exercise) at least 3 days a week. This may include lifting weights or doing Pilates. Do not smoke or use any products that contain nicotine or tobacco. If you need help quitting, ask your doctor. Check your blood pressure at home as told by your doctor. Keep all follow-up visits. Medicines Take over-the-counter and prescription medicines  only as told by your doctor. Follow directions carefully. Do not skip doses of blood pressure medicine. The medicine does not work as well if you skip doses. Skipping doses also puts you at risk for problems. Ask your doctor about side effects or reactions to medicines that you should watch  for. Contact a doctor if: You think you are having a reaction to the medicine you are taking. You have headaches that keep coming back. You feel dizzy. You have swelling in your ankles. You have trouble with your vision. Get help right away if: You get a very bad headache. You start to feel mixed up (confused). You feel weak or numb. You feel faint. You have very bad pain in your: Chest. Belly (abdomen). You vomit more than once. You have trouble breathing. These symptoms may be an emergency. Get help right away. Call 911. Do not wait to see if the symptoms will go away. Do not drive yourself to the hospital. Summary Hypertension is another name for high blood pressure. High blood pressure forces your heart to work harder to pump blood. For most people, a normal blood pressure is less than 120/80. Making healthy choices can help lower blood pressure. If your blood pressure does not get lower with healthy choices, you may need to take medicine. This information is not intended to replace advice given to you by your health care provider. Make sure you discuss any questions you have with your health care provider. Document Revised: 04/15/2021 Document Reviewed: 04/15/2021 Elsevier Patient Education  2024 [employer].

## 2023-09-18 NOTE — Progress Notes (Signed)
 I,[name], CMA,acting as a Neurosurgeon for Mary Aliment, MD.,have documented all relevant documentation on the behalf of Mary Aliment, MD,as directed by  Mary Aliment, MD while in the presence of Mary Aliment, MD.  Subjective:  Patient ID: Mary Neal , female    DOB: 1966-04-16 , 58 y.o.   MRN: [id number]  Chief Complaint  Patient presents with   Hypertension    HPI  She presents today for BP & vitamin D check. She reports compliance with meds. She is accompanied by her Mother today. She denies having any headaches, palpitations and shortness of breath.    Hypertension This is a chronic problem. The problem has been gradually improving since onset. The problem is controlled. Associated symptoms include chest pain. Past treatments include angiotensin blockers. The current treatment provides moderate improvement. There is no history of CVA or heart failure.     Past Medical History:  Diagnosis Date   Abnormal Pap smear 2013   Dislocation of right hip (HCC) 06/09/2018   Per MRI   History of knee replacement 2005   Spina bifida [facility])    Uterine prolapse    Wears glasses      Family History  Problem Relation Age of Onset   Hypertension Mother    Hyperlipidemia Mother    Heart attack Brother      Current Outpatient Medications:    amLODipine (NORVASC) 2.5 MG tablet, Take 1 tablet (2.5 mg total) by mouth daily., Disp: 30 tablet, Rfl: 11   azithromycin (ZITHROMAX) 250 MG tablet, TAKE 2 TABLETS (500 MG) X 1 HOUR PRIOR TO PROCEDURE, Disp: 2 tablet, Rfl: 2   Digestive Enzymes (SUPER PAPAYA PLUS PO), Take 1 capsule by mouth daily at 2 am., Disp: , Rfl:    famotidine (PEPCID) 20 MG tablet, TAKE 1 TABLET BY MOUTH DAILY, Disp: 100 tablet, Rfl: 2   Magnesium 300 MG CAPS, Take by mouth., Disp: , Rfl:    meloxicam (MOBIC) 7.5 MG tablet, Take 1 tablet by mouth x 5 days then one tab daily as needed, Disp: 30 tablet, Rfl: 1   Multiple Vitamins-Minerals (MULTIVITAMIN  GUMMIES WOMENS PO), Take by mouth daily. 2 gummies daily, Disp: , Rfl:    oxybutynin (DITROPAN XL) 15 MG 24 hr tablet, TAKE 1 TABLET BY MOUTH TWICE  DAILY, Disp: 200 tablet, Rfl: 2   valsartan (DIOVAN) 160 MG tablet, TAKE 1 TABLET BY MOUTH DAILY, Disp: 100 tablet, Rfl: 2   vitamin C (ASCORBIC ACID) 500 MG tablet, Take by mouth., Disp: , Rfl:    Allergies  Allergen Reactions   Clindamycin/Lincomycin Palpitations   Latex Other (See Comments)   Penicillins Other (See Comments)     Review of Systems  Constitutional: Negative.   Respiratory: Negative.    Cardiovascular:  Positive for chest pain.       She admits she had an episode of chest pain today on the way to the office today. She states her sx resolved once she belched.  She reports compliance with famotidine.   Gastrointestinal: Negative.   Neurological: Negative.   Psychiatric/Behavioral: Negative.       Today's Vitals   09/18/23 1103  BP: 134/78  Pulse: 70  Temp: 97.7 F (36.5 C)  SpO2: 98%  Weight: 112 lb 12.8 oz (51.2 kg)  Height: 4\' 9"  (1.448 m)   Body mass index is 24.41 kg/m.  Wt Readings from Last 3 Encounters:  09/18/23 112 lb 12.8 oz (51.2 kg)  07/11/23  112 lb (50.8 kg)  05/30/23 112 lb (50.8 kg)     Objective:  Physical Exam Vitals and nursing note reviewed.  Constitutional:      Appearance: Normal appearance.  HENT:     Head: Normocephalic and atraumatic.  Eyes:     Extraocular Movements: Extraocular movements intact.  Cardiovascular:     Rate and Rhythm: Normal rate and regular rhythm.     Heart sounds: Normal heart sounds.  Pulmonary:     Effort: Pulmonary effort is normal.     Breath sounds: Normal breath sounds.  Musculoskeletal:     Cervical back: Normal range of motion.     Comments: Ambulatory with crutches  Skin:    General: Skin is warm.  Neurological:     General: No focal deficit present.     Mental Status: She is alert.  Psychiatric:        Mood and Affect: Mood normal.         Behavior: Behavior normal.         Assessment And Plan:  Essential hypertension, benign Assessment & Plan: Chronic, fair control.  Goal BP<120/80. She will continue with valsartan 160mg  daily in AM and amlodipine 2.5mg  nightly. She is reminded to follow a low sodium diet. She will f /u in four to six months for re-evaluation.   Orders: -     CMP14+EGFR -     Lipid panel  GERD Assessment & Plan: Chronic.  I planned to d/c famotidine and start PPI.  She does not wish to change her meds at this time, she will c/w famotidine. She is encouraged to avoid known triggers.  She is also reminded to stop eating 3 hrs prior to lying down.    Spina bifida without hydrocephalus, unspecified spinal region [facility]) Assessment & Plan: Chronic, ambulatory with crutches. Followed by PM&R at [facility]. Most recent notes reviewed in detail.       Return for 4 MONTH BPC..  Patient was given opportunity to ask questions. Patient verbalized understanding of the plan and was able to repeat key elements of the plan. All questions were answered to their satisfaction.    I, Mary Aliment, MD, have reviewed all documentation for this visit. The documentation on 09/18/23 for the exam, diagnosis, procedures, and orders are all accurate and complete.   IF YOU HAVE BEEN REFERRED TO A SPECIALIST, IT MAY TAKE 1-2 WEEKS TO SCHEDULE/PROCESS THE REFERRAL. IF YOU HAVE NOT HEARD FROM US/SPECIALIST IN TWO WEEKS, PLEASE GIVE [place] A CALL AT [phone] X [phone].   THE PATIENT IS ENCOURAGED TO PRACTICE SOCIAL DISTANCING DUE TO THE COVID-19 PANDEMIC.

## 2023-09-19 LAB — LIPID PANEL
Chol/HDL Ratio: 2.1 ratio (ref 0.0–4.4)
Cholesterol, Total: 197 mg/dL (ref 100–199)
HDL: 93 mg/dL (ref >39)
LDL Chol Calc (NIH): 95 mg/dL (ref 0–99)
Triglycerides: 46 mg/dL (ref 0–149)
VLDL Cholesterol Cal: 9 mg/dL (ref 5–40)

## 2023-09-19 LAB — CMP14+EGFR
ALT: 21 IU/L (ref 0–32)
AST: 31 IU/L (ref 0–40)
Albumin: 4.1 g/dL (ref 3.8–4.9)
Alkaline Phosphatase: 72 IU/L (ref 44–121)
BUN/Creatinine Ratio: 28 — ABNORMAL HIGH (ref 9–23)
BUN: 17 mg/dL (ref 6–24)
Bilirubin Total: 0.5 mg/dL (ref 0.0–1.2)
CO2: 24 mmol/L (ref 20–29)
Calcium: 9.8 mg/dL (ref 8.7–10.2)
Chloride: 102 mmol/L (ref 96–106)
Creatinine, Ser: 0.6 mg/dL (ref 0.57–1.00)
Globulin, Total: 2.5 g/dL (ref 1.5–4.5)
Glucose: 76 mg/dL (ref 70–99)
Potassium: 4.1 mmol/L (ref 3.5–5.2)
Sodium: 140 mmol/L (ref 134–144)
Total Protein: 6.6 g/dL (ref 6.0–8.5)
eGFR: 104 mL/min/{1.73_m2} (ref >59)

## 2023-09-20 ENCOUNTER — Ambulatory Visit: Payer: 59 | Admitting: Internal Medicine

## 2023-09-23 DIAGNOSIS — L648 Other androgenic alopecia: Secondary | ICD-10-CM | POA: Insufficient documentation

## 2023-09-23 NOTE — Assessment & Plan Note (Signed)
 Chronic, ambulatory with crutches. Followed by PM&R at [facility]. Most recent notes reviewed in detail.

## 2023-09-23 NOTE — Assessment & Plan Note (Signed)
 Chronic, fair control.  Goal BP<120/80. She will continue with valsartan 160mg  daily in AM and amlodipine 2.5mg  nightly. She is reminded to follow a low sodium diet. She will f /u in four to six months for re-evaluation.

## 2023-09-23 NOTE — Assessment & Plan Note (Signed)
 Chronic.  I planned to d/c famotidine and start PPI.  She does not wish to change her meds at this time, she will c/w famotidine. She is encouraged to avoid known triggers.  She is also reminded to stop eating 3 hrs prior to lying down.

## 2023-09-25 ENCOUNTER — Ambulatory Visit: Payer: Self-pay

## 2023-09-26 NOTE — Patient Instructions (Addendum)
 Visit Information  Thank you for taking time to visit with me today. Please don't hesitate to contact me if I can be of assistance to you.   Following are the goals we discussed today:   Goals Addressed             This Visit's Progress    I would like to keep my BP under good control       Care Coordination Interventions: Evaluation of current treatment plan related to hypertension self management and patient's adherence to plan as established by provider Review of patient status, including review of consultant's reports, relevant laboratory and other test results, and medications completed Advised patient, providing education and rationale, to monitor blood pressure weekly and record, calling PCP for findings outside established parameters Discussed plans with patient for ongoing care coordination follow up and provided patient with direct contact information for nurse care coordinator Educated mother regarding importance of adhering to a low Sodium diet and staying well hydrated with water, aiming for 48-64 oz daily unless otherwise  directed Discussed mother would like to connect with a SW to explore other adult enrichment centers for patient to attend near their home, SW referral sent and telephone visit scheduled with Remigio Eisenmenger BSW for 10/09/23 @1 :00 PM Last practice recorded BP readings:  BP Readings from Last 3 Encounters:  09/18/23 134/78  07/11/23 130/80  05/30/23 110/78   Most recent eGFR/CrCl:  Lab Results  Component Value Date   EGFR 104 09/18/2023    No components found for: "CRCL"           Our next appointment is by telephone on 11/28/23 at 1:30 PM  Please call the care guide team at [phone] if you need to cancel or reschedule your appointment.   If you are experiencing a Mental Health or Behavioral Health Crisis or need someone to talk to, please call 1-800-273-TALK (toll free, 24 hour hotline)  Patient verbalizes understanding of instructions and  care plan provided today and agrees to view in MyChart. Active MyChart status and patient understanding of how to access instructions and care plan via MyChart confirmed with patient.     Delsa Sale RN BSN CCM [place]  [facility], [facility] Nurse Care Coordinator  Direct Dial: [phone] Website: [email or web address]

## 2023-09-26 NOTE — Patient Outreach (Signed)
 Care Coordination   Follow Up Visit Note   09/25/2023 Name: Mary Neal MRN: [id number] DOB: [birth year]  Mary Neal is a 58 y.o. year old female who sees Dorothyann Peng, MD for primary care. I spoke with mother Leatta Alewine by phone today.  What matters to the patients health and wellness today?  Patient's mother would like to explore options for an alternative adult enrichment center for patient to attend near their home.     Goals Addressed             This Visit's Progress    I would like to keep my BP under good control       Care Coordination Interventions: Evaluation of current treatment plan related to hypertension self management and patient's adherence to plan as established by provider Review of patient status, including review of consultant's reports, relevant laboratory and other test results, and medications completed Advised patient, providing education and rationale, to monitor blood pressure weekly and record, calling PCP for findings outside established parameters Discussed plans with patient for ongoing care coordination follow up and provided patient with direct contact information for nurse care coordinator Educated mother regarding importance of adhering to a low Sodium diet and staying well hydrated with water, aiming for 48-64 oz daily unless otherwise  directed Discussed mother would like to connect with a SW to explore other adult enrichment centers for patient to attend near their home, SW referral sent and telephone visit scheduled with Remigio Eisenmenger BSW for 10/09/23 @1 :00 PM Last practice recorded BP readings:  BP Readings from Last 3 Encounters:  09/18/23 134/78  07/11/23 130/80  05/30/23 110/78   Most recent eGFR/CrCl:  Lab Results  Component Value Date   EGFR 104 09/18/2023    No components found for: "CRCL"           SDOH assessments and interventions completed:  No     Care Coordination Interventions:  Yes, provided    Follow up plan: Referral made to Remigio Eisenmenger BSW Follow up call scheduled for 11/27/23 @1 :30 PM    Encounter Outcome:  Patient Visit Completed

## 2023-10-06 DIAGNOSIS — R339 Retention of urine, unspecified: Secondary | ICD-10-CM | POA: Diagnosis not present

## 2023-10-09 ENCOUNTER — Ambulatory Visit: Payer: Self-pay | Admitting: Licensed Clinical Social Worker

## 2023-10-09 NOTE — Patient Instructions (Signed)
 Visit Information  Thank you for taking time to visit with me today. Please don't hesitate to contact me if I can be of assistance to you.   Following are the goals we discussed today:   Goals Addressed             This Visit's Progress    Care Coordination Activities       Care Coordination Interventions: Patient is disabled with spina bifida and the SW spoke to the caretaker by phone, the patient was at her day enrichment center ([facility]) The caretaker (mother Berton Mount) stated that they need a change up for a day center, her daughter is not getting what she needs from the one that she is attending.  SW encouraged the caretaker to call the CAP case manager to obtain a list of day enrichment centers and the SW will also gather some information to send toe the family with a list of day centers. SW completed the SDOH questions and no other needs at this time SW will follow up 10/23/2023 at 1:00 pm        Our next appointment is by telephone on 10/23/2023 at 1:00 pm  Please call the care guide team at [phone] if you need to cancel or reschedule your appointment.   If you are experiencing a Mental Health or Behavioral Health Crisis or need someone to talk to, please call the Suicide and Crisis Lifeline: 988 go to [facility] Urgent [facility] [address] [place] [phone]) call 911  Patient verbalizes understanding of instructions and care plan provided today and agrees to view in MyChart. Active MyChart status and patient understanding of how to access instructions and care plan via MyChart confirmed with patient.     Jeanie Cooks, PhD [facility], [facility] Social Worker Direct Dial: [phone]  Fax: [phone]

## 2023-10-09 NOTE — Patient Outreach (Signed)
 Care Coordination   Initial Visit Note   10/09/2023 Name: Mary Neal MRN: [id number] DOB: 1966-01-11  Mary Neal is a 58 y.o. year old female who sees Dorothyann Peng, MD for primary care. I spoke with  Mary Neal by phone today.  What matters to the patients health and wellness today?  Adult Enrichment Centers    Goals Addressed             This Visit's Progress    Care Coordination Activities       Care Coordination Interventions: Patient is disabled with spina bifida and the SW spoke to the caretaker by phone, the patient was at her day enrichment center ([facility]) The caretaker (mother Berton Mount) stated that they need a change up for a day center, her daughter is not getting what she needs from the one that she is attending.  SW encouraged the caretaker to call the CAP case manager to obtain a list of day enrichment centers and the SW will also gather some information to send toe the family with a list of day centers. SW completed the SDOH questions and no other needs at this time SW will follow up 10/23/2023 at 1:00 pm        SDOH assessments and interventions completed:  Yes  SDOH Interventions Today    Flowsheet Row Most Recent Value  SDOH Interventions   Food Insecurity Interventions Intervention Not Indicated  Housing Interventions Intervention Not Indicated  Transportation Interventions Intervention Not Indicated  Utilities Interventions Intervention Not Indicated        Care Coordination Interventions:  Yes, provided  Interventions Today    Flowsheet Row Most Recent Value  General Interventions   General Interventions Discussed/Reviewed General Interventions Discussed, Community Resources  [SW will mail some Adult enrichment centers and the caretaker will call the CAP case worker to ask for a list of places]        Follow up plan: Follow up call scheduled for 10/23/2023 at 1:00 pm    Encounter Outcome:  Patient Visit  Completed   Jeanie Cooks, PhD [facility], [facility] Social Worker Direct Dial: [phone]  Fax: [phone]

## 2023-10-23 ENCOUNTER — Ambulatory Visit: Payer: Self-pay | Admitting: Licensed Clinical Social Worker

## 2023-10-23 NOTE — Patient Instructions (Signed)
 Visit Information  Thank you for taking time to visit with me today. Please don't hesitate to contact me if I can be of assistance to you before our next scheduled appointment.  Your next care management appointment is by telephone on 11/08/2023 at 3:00 pm  Telephone follow up appointment date/time:  11/08/2023 at 3:00 pm  Please call the care guide team at [phone] if you need to cancel, schedule, or reschedule an appointment.   Please call the Suicide and Crisis Lifeline: 988 go to [facility] Urgent [facility] [address] [place] [phone]) call 911 if you are experiencing a Mental Health or Behavioral Health Crisis or need someone to talk to.  Jonda Neighbours, PhD [facility], [facility] Social Worker Direct Dial: [phone]  Fax: [phone]

## 2023-10-23 NOTE — Patient Outreach (Signed)
 Complex Care Management   Visit Note  10/23/2023  Name:  Mary Neal MRN: [id number] DOB: September 03, 1965  Situation: Referral received for Complex Care Management related to SDOH Barriers:  Adult centers resources list  I obtained verbal consent from Caregiver.  Visit completed with Caregiver  on the phone  Background:   Past Medical History:  Diagnosis Date   Abnormal Pap smear 2013   Dislocation of right hip (HCC) 06/09/2018   Per MRI   History of knee replacement 2005   Spina bifida [facility])    Uterine prolapse    Wears glasses     Assessment: Patient Reported Symptoms:  Cognitive        Neurological      HEENT        Cardiovascular      Respiratory      Endocrine      Gastrointestinal        Genitourinary      Integumentary      Musculoskeletal          Psychosocial              08/02/2023    3:00 PM  Depression screen PHQ 2/9  Decreased Interest 0  Down, Depressed, Hopeless 0  PHQ - 2 Score 0    There were no vitals filed for this visit.  Medications Reviewed Today   Medications were not reviewed in this encounter     Recommendation:   Patient is waiting for the Adult Centers resource list by mail   Follow Up Plan:   Telephone follow up appointment date/time:  11/08/2023 at 3:00 pm  Jonda Neighbours, PhD [facility], [facility] Social Worker Direct Dial: [phone]  Fax: [phone]

## 2023-10-29 ENCOUNTER — Other Ambulatory Visit: Payer: Self-pay | Admitting: Internal Medicine

## 2023-11-06 DIAGNOSIS — R339 Retention of urine, unspecified: Secondary | ICD-10-CM | POA: Diagnosis not present

## 2023-11-08 ENCOUNTER — Other Ambulatory Visit: Payer: Self-pay | Admitting: Licensed Clinical Social Worker

## 2023-11-08 NOTE — Patient Instructions (Signed)

## 2023-11-08 NOTE — Patient Outreach (Signed)
 Complex Care Management   Visit Note  11/08/2023  Name:  Mary Neal MRN: [id number] DOB: April 18, 1966  Situation: Referral received for Complex Care Management related to SDOH Barriers:  Adult day center  I obtained verbal consent from Caregiver.  Visit completed with Mother Lovelee Gniadek  on the phone  Background:   Past Medical History:  Diagnosis Date   Abnormal Pap smear 2013   Dislocation of right hip (HCC) 06/09/2018   Per MRI   History of knee replacement 2005   Spina bifida [facility])    Uterine prolapse    Wears glasses     Assessment: Patient is not having any issues at the adult day center and the caretaker stated taht she is ok where she is and does not want to move her daughter.    Recommendation:   none  Follow Up Plan:   Closing From:  Complex Care Management  Jonda Neighbours, PhD [facility], [facility] Social Worker Direct Dial: [phone]  Fax: [phone]

## 2023-11-21 ENCOUNTER — Encounter: Payer: Self-pay | Admitting: Internal Medicine

## 2023-11-21 NOTE — Progress Notes (Signed)
 SCANNED FORM.

## 2023-11-21 NOTE — Progress Notes (Unsigned)
 I,Sriram Febles T Basil Lim, CMA,acting as a Neurosurgeon for Smiley Dung, MD.,have documented all relevant documentation on the behalf of Smiley Dung, MD,as directed by  Smiley Dung, MD while in the presence of Smiley Dung, MD.  Subjective:  Patient ID: Mary Neal , female    DOB: 1966/05/16 , 58 y.o.   MRN: [id number]  No chief complaint on file.   HPI  HPI   Past Medical History:  Diagnosis Date   Abnormal Pap smear 2013   Dislocation of right hip (HCC) 06/09/2018   Per MRI   History of knee replacement 2005   Spina bifida [facility])    Uterine prolapse    Wears glasses      Family History  Problem Relation Age of Onset   Hypertension Mother    Hyperlipidemia Mother    Heart attack Brother      Current Outpatient Medications:    amLODipine  (NORVASC ) 2.5 MG tablet, Take 1 tablet (2.5 mg total) by mouth daily., Disp: 30 tablet, Rfl: 11   azithromycin  (ZITHROMAX ) 250 MG tablet, TAKE 2 TABLETS (500 MG) X 1 HOUR PRIOR TO PROCEDURE, Disp: 2 tablet, Rfl: 2   Digestive Enzymes (SUPER PAPAYA PLUS PO), Take 1 capsule by mouth daily at 2 am., Disp: , Rfl:    famotidine  (PEPCID ) 20 MG tablet, TAKE 1 TABLET BY MOUTH DAILY, Disp: 100 tablet, Rfl: 2   Magnesium  300 MG CAPS, Take by mouth., Disp: , Rfl:    meloxicam  (MOBIC ) 7.5 MG tablet, Take 1 tablet by mouth x 5 days then one tab daily as needed, Disp: 30 tablet, Rfl: 1   Multiple Vitamins-Minerals (MULTIVITAMIN GUMMIES WOMENS PO), Take by mouth daily. 2 gummies daily, Disp: , Rfl:    oxybutynin  (DITROPAN  XL) 15 MG 24 hr tablet, TAKE 1 TABLET BY MOUTH TWICE  DAILY, Disp: 200 tablet, Rfl: 2   valsartan  (DIOVAN ) 160 MG tablet, TAKE 1 TABLET BY MOUTH DAILY, Disp: 100 tablet, Rfl: 2   vitamin C (ASCORBIC ACID) 500 MG tablet, Take by mouth., Disp: , Rfl:    Allergies  Allergen Reactions   Clindamycin /Lincomycin Palpitations   Latex Other (See Comments)   Penicillins Other (See Comments)     Review of Systems   There were no  vitals filed for this visit. There is no height or weight on file to calculate BMI.  Wt Readings from Last 3 Encounters:  09/18/23 112 lb 12.8 oz (51.2 kg)  07/11/23 112 lb (50.8 kg)  05/30/23 112 lb (50.8 kg)     Objective:  Physical Exam      Assessment And Plan:  There are no diagnoses linked to this encounter.   No follow-ups on file.  Patient was given opportunity to ask questions. Patient verbalized understanding of the plan and was able to repeat key elements of the plan. All questions were answered to their satisfaction.  Smiley Dung, MD  I, Smiley Dung, MD, have reviewed all documentation for this visit. The documentation on 11/21/23 for the exam, diagnosis, procedures, and orders are all accurate and complete.   IF YOU HAVE BEEN REFERRED TO A SPECIALIST, IT MAY TAKE 1-2 WEEKS TO SCHEDULE/PROCESS THE REFERRAL. IF YOU HAVE NOT HEARD FROM US /SPECIALIST IN TWO WEEKS, PLEASE GIVE US  A CALL AT [phone] X [phone].   THE PATIENT IS ENCOURAGED TO PRACTICE SOCIAL DISTANCING DUE TO THE COVID-19 PANDEMIC.

## 2023-11-24 ENCOUNTER — Other Ambulatory Visit: Payer: Self-pay | Admitting: Internal Medicine

## 2023-11-27 ENCOUNTER — Encounter

## 2023-12-20 DIAGNOSIS — R339 Retention of urine, unspecified: Secondary | ICD-10-CM | POA: Diagnosis not present

## 2023-12-25 ENCOUNTER — Telehealth: Payer: Self-pay

## 2024-01-24 DIAGNOSIS — R339 Retention of urine, unspecified: Secondary | ICD-10-CM | POA: Diagnosis not present

## 2024-01-31 ENCOUNTER — Encounter: Payer: Self-pay | Admitting: Internal Medicine

## 2024-01-31 ENCOUNTER — Ambulatory Visit: Payer: Self-pay | Admitting: Internal Medicine

## 2024-01-31 VITALS — BP 140/70 | HR 93 | Temp 98.2°F | Ht <= 58 in | Wt 116.8 lb

## 2024-01-31 DIAGNOSIS — L648 Other androgenic alopecia: Secondary | ICD-10-CM

## 2024-01-31 DIAGNOSIS — Z789 Other specified health status: Secondary | ICD-10-CM

## 2024-01-31 DIAGNOSIS — I10 Essential (primary) hypertension: Secondary | ICD-10-CM | POA: Diagnosis not present

## 2024-01-31 DIAGNOSIS — G8220 Paraplegia, unspecified: Secondary | ICD-10-CM

## 2024-01-31 DIAGNOSIS — Q059 Spina bifida, unspecified: Secondary | ICD-10-CM | POA: Diagnosis not present

## 2024-01-31 DIAGNOSIS — M25511 Pain in right shoulder: Secondary | ICD-10-CM | POA: Diagnosis not present

## 2024-01-31 DIAGNOSIS — D696 Thrombocytopenia, unspecified: Secondary | ICD-10-CM

## 2024-01-31 DIAGNOSIS — G6289 Other specified polyneuropathies: Secondary | ICD-10-CM

## 2024-01-31 DIAGNOSIS — G8929 Other chronic pain: Secondary | ICD-10-CM

## 2024-01-31 DIAGNOSIS — K219 Gastro-esophageal reflux disease without esophagitis: Secondary | ICD-10-CM

## 2024-01-31 DIAGNOSIS — E559 Vitamin D deficiency, unspecified: Secondary | ICD-10-CM | POA: Diagnosis not present

## 2024-01-31 NOTE — Patient Instructions (Signed)
 Hypertension, Adult Hypertension is another name for high blood pressure. High blood pressure forces your heart to work harder to pump blood. This can cause problems over time. There are two numbers in a blood pressure reading. There is a top number (systolic) over a bottom number (diastolic). It is best to have a blood pressure that is below 120/80. What are the causes? The cause of this condition is not known. Some other conditions can lead to high blood pressure. What increases the risk? Some lifestyle factors can make you more likely to develop high blood pressure: Smoking. Not getting enough exercise or physical activity. Being overweight. Having too much fat, sugar, calories, or salt (sodium) in your diet. Drinking too much alcohol. Other risk factors include: Having any of these conditions: Heart disease. Diabetes. High cholesterol. Kidney disease. Obstructive sleep apnea. Having a family history of high blood pressure and high cholesterol. Age. The risk increases with age. Stress. What are the signs or symptoms? High blood pressure may not cause symptoms. Very high blood pressure (hypertensive crisis) may cause: Headache. Fast or uneven heartbeats (palpitations). Shortness of breath. Nosebleed. Vomiting or feeling like you may vomit (nauseous). Changes in how you see. Very bad chest pain. Feeling dizzy. Seizures. How is this treated? This condition is treated by making healthy lifestyle changes, such as: Eating healthy foods. Exercising more. Drinking less alcohol. Your doctor may prescribe medicine if lifestyle changes do not help enough and if: Your top number is above 130. Your bottom number is above 80. Your personal target blood pressure may vary. Follow these instructions at home: Eating and drinking  If told, follow the DASH eating plan. To follow this plan: Fill one half of your plate at each meal with fruits and vegetables. Fill one fourth of your plate  at each meal with whole grains. Whole grains include whole-wheat pasta, brown rice, and whole-grain bread. Eat or drink low-fat dairy products, such as skim milk or low-fat yogurt. Fill one fourth of your plate at each meal with low-fat (lean) proteins. Low-fat proteins include fish, chicken without skin, eggs, beans, and tofu. Avoid fatty meat, cured and processed meat, or chicken with skin. Avoid pre-made or processed food. Limit the amount of salt in your diet to less than 1,500 mg each day. Do not drink alcohol if: Your doctor tells you not to drink. You are pregnant, may be pregnant, or are planning to become pregnant. If you drink alcohol: Limit how much you have to: 0-1 drink a day for women. 0-2 drinks a day for men. Know how much alcohol is in your drink. In the [place] one drink equals one 12 oz bottle of beer (355 mL), one 5 oz glass of wine (148 mL), or one 1 oz glass of hard liquor (44 mL). Lifestyle  Work with your doctor to stay at a healthy weight or to lose weight. Ask your doctor what the best weight is for you. Get at least 30 minutes of exercise that causes your heart to beat faster (aerobic exercise) most days of the week. This may include walking, swimming, or biking. Get at least 30 minutes of exercise that strengthens your muscles (resistance exercise) at least 3 days a week. This may include lifting weights or doing Pilates. Do not smoke or use any products that contain nicotine or tobacco. If you need help quitting, ask your doctor. Check your blood pressure at home as told by your doctor. Keep all follow-up visits. Medicines Take over-the-counter and prescription medicines  only as told by your doctor. Follow directions carefully. Do not skip doses of blood pressure medicine. The medicine does not work as well if you skip doses. Skipping doses also puts you at risk for problems. Ask your doctor about side effects or reactions to medicines that you should watch  for. Contact a doctor if: You think you are having a reaction to the medicine you are taking. You have headaches that keep coming back. You feel dizzy. You have swelling in your ankles. You have trouble with your vision. Get help right away if: You get a very bad headache. You start to feel mixed up (confused). You feel weak or numb. You feel faint. You have very bad pain in your: Chest. Belly (abdomen). You vomit more than once. You have trouble breathing. These symptoms may be an emergency. Get help right away. Call 911. Do not wait to see if the symptoms will go away. Do not drive yourself to the hospital. Summary Hypertension is another name for high blood pressure. High blood pressure forces your heart to work harder to pump blood. For most people, a normal blood pressure is less than 120/80. Making healthy choices can help lower blood pressure. If your blood pressure does not get lower with healthy choices, you may need to take medicine. This information is not intended to replace advice given to you by your health care provider. Make sure you discuss any questions you have with your health care provider. Document Revised: 04/15/2021 Document Reviewed: 04/15/2021 Elsevier Patient Education  2024 [employer].

## 2024-01-31 NOTE — Progress Notes (Signed)
 I,Victoria T Emmitt, CMA,acting as a Neurosurgeon for Catheryn LOISE Slocumb, MD.,have documented all relevant documentation on the behalf of Catheryn LOISE Slocumb, MD,as directed by  Catheryn LOISE Slocumb, MD while in the presence of Catheryn LOISE Slocumb, MD.  Subjective:  Patient ID: Mary Neal , female    DOB: 04-11-1966 , 58 y.o.   MRN: [id number]  Chief Complaint  Patient presents with   Hypertension    Patient presents today for bpc. She reports compliance with medications. Denies headache, chest pain & sob. She would like another list of foods she can take to the [facility] senior enrichment center.     HPI Discussed the use of AI scribe software for clinical note transcription with the patient, who gave verbal consent to proceed.  History of Present Illness Mary Neal is a 58 year old female with hypertension who presents for a blood pressure check.  She is accompanied by her mother today.  She has a history of hypertension and is currently taking amlodipine  2.5 mg and valsartan . Her blood pressure was previously noted to be elevated, but she is unsure of the circumstances. She does not regularly check her blood pressure at home due to concerns about the accuracy of her home monitor. A nurse at her day program checked her blood pressure in June, but it has not been checked monthly as expected.  She experiences a sensation of 'needles' in her shoulder, which she associates with past shoulder pain thought to be arthritis. She previously saw an orthopedic doctor for her shoulder pain. She carries multiple bags due to her medical needs, including a pocketbook with personal items and a backpack, which she finds challenging due to her shoulder issues.  She has no feeling in her feet, a condition present for a long time. She uses crutches for mobility and finds it difficult to manage her bags while walking. She needs assistance at her day program, particularly with carrying her bags upon arrival and  departure, but notes inconsistency in the help provided by the staff.  She mentions dietary restrictions at her day program, specifically confusion about being allowed coleslaw but not cooked cabbage. She expresses frustration with the inconsistency of her meal plan and requests another letter to clarify her dietary needs.   Hypertension This is a chronic problem. The problem has been gradually improving since onset. The problem is controlled. Past treatments include angiotensin blockers and calcium channel blockers. The current treatment provides moderate improvement.     Past Medical History:  Diagnosis Date   Abnormal Pap smear 2013   Dislocation of right hip (HCC) 06/09/2018   Per MRI   History of knee replacement 2005   Spina bifida [facility])    Uterine prolapse    Wears glasses      Family History  Problem Relation Age of Onset   Hypertension Mother    Hyperlipidemia Mother    Heart attack Brother      Current Outpatient Medications:    amLODipine  (NORVASC ) 2.5 MG tablet, Take 1 tablet (2.5 mg total) by mouth daily., Disp: 30 tablet, Rfl: 11   azithromycin  (ZITHROMAX ) 250 MG tablet, TAKE 2 TABLETS (500 MG) X 1 HOUR PRIOR TO PROCEDURE, Disp: 2 tablet, Rfl: 2   Digestive Enzymes (SUPER PAPAYA PLUS PO), Take 1 capsule by mouth daily at 2 am., Disp: , Rfl:    famotidine  (PEPCID ) 20 MG tablet, TAKE 1 TABLET BY MOUTH DAILY, Disp: 100 tablet, Rfl: 2   Magnesium  300 MG CAPS,  Take by mouth., Disp: , Rfl:    meloxicam  (MOBIC ) 7.5 MG tablet, Take 1 tablet by mouth x 5 days then one tab daily as needed, Disp: 30 tablet, Rfl: 1   Multiple Vitamins-Minerals (MULTIVITAMIN GUMMIES WOMENS PO), Take by mouth daily. 2 gummies daily, Disp: , Rfl:    oxybutynin  (DITROPAN  XL) 15 MG 24 hr tablet, TAKE 1 TABLET BY MOUTH TWICE  DAILY, Disp: 200 tablet, Rfl: 2   valsartan  (DIOVAN ) 160 MG tablet, TAKE 1 TABLET BY MOUTH DAILY, Disp: 100 tablet, Rfl: 2   vitamin C (ASCORBIC ACID) 500 MG tablet, Take by  mouth., Disp: , Rfl:    Allergies  Allergen Reactions   Clindamycin /Lincomycin Palpitations   Latex Other (See Comments)   Penicillins Other (See Comments)     Review of Systems  Constitutional: Negative.   Respiratory: Negative.    Cardiovascular: Negative.   Gastrointestinal: Negative.   Musculoskeletal:  Positive for arthralgias.  Neurological: Negative.   Psychiatric/Behavioral: Negative.       Today's Vitals   01/31/24 1055 01/31/24 1100  BP: (!) 140/80 (!) 140/70  Pulse: 93   Temp: 98.2 F (36.8 C)   SpO2: 98%   Weight: 116 lb 12.8 oz (53 kg)   Height: 4' 9 (1.448 m)    Body mass index is 25.28 kg/m.  Wt Readings from Last 3 Encounters:  01/31/24 116 lb 12.8 oz (53 kg)  09/18/23 112 lb 12.8 oz (51.2 kg)  07/11/23 112 lb (50.8 kg)     Objective:  Physical Exam Vitals reviewed.  Constitutional:      Appearance: Normal appearance.  HENT:     Head: Normocephalic and atraumatic.  Eyes:     Extraocular Movements: Extraocular movements intact.  Cardiovascular:     Rate and Rhythm: Normal rate and regular rhythm.     Heart sounds: Normal heart sounds.  Pulmonary:     Effort: Pulmonary effort is normal.     Breath sounds: Normal breath sounds.  Musculoskeletal:        General: Tenderness present.     Cervical back: Normal range of motion.     Comments: Ambulatory with crutches  Skin:    General: Skin is warm.  Neurological:     General: No focal deficit present.     Mental Status: She is alert.  Psychiatric:        Mood and Affect: Mood normal.        Behavior: Behavior normal.         Assessment And Plan:  Essential hypertension, benign Assessment & Plan: Chronic, fair control.  Goal BP<120/80. She will continue with valsartan  160mg  daily in AM and amlodipine  2.5mg  nightly. She is reminded to follow a low sodium diet. She will f /u in four to six months for re-evaluation.   Orders: -     CBC -     BMP8+EGFR  Spina bifida without  hydrocephalus, unspecified spinal region [facility]) Assessment & Plan: Chronic, ambulatory with crutches. Followed by PM&R at [facility]. Most recent notes reviewed in detail.    Peripheral motor neuropathy Assessment & Plan: Chronic lack of sensation in feet affects mobility. Backpack use may reduce shoulder strain. - Advise using a backpack to distribute weight  - Likely related to spina bifida   Chronic right shoulder pain Assessment & Plan: Shoulder pain affects mobility and independence. Managed by orthopedic specialist. - Draft a letter to the day program requesting assistance with carrying bags due to shoulder pain.  Vitamin D  deficiency disease -     VITAMIN D  25 Hydroxy (Vit-D Deficiency, Fractures)  Difficulty managing dietary regimen Assessment & Plan: She has many foods that she is intolerant of. This information has previously been submitted to her Day program. Confusion regarding coleslaw versus cooked cabbage. Clear communication needed for health management. - Draft a letter to the day program clarifying dietary restrictions.    Return in 4 months (on 06/02/2024), or bp check.  Patient was given opportunity to ask questions. Patient verbalized understanding of the plan and was able to repeat key elements of the plan. All questions were answered to their satisfaction.   I, Catheryn LOISE Slocumb, MD, have reviewed all documentation for this visit. The documentation on 01/31/24 for the exam, diagnosis, procedures, and orders are all accurate and complete.   IF YOU HAVE BEEN REFERRED TO A SPECIALIST, IT MAY TAKE 1-2 WEEKS TO SCHEDULE/PROCESS THE REFERRAL. IF YOU HAVE NOT HEARD FROM US /SPECIALIST IN TWO WEEKS, PLEASE GIVE US  A CALL AT [phone].   THE PATIENT IS ENCOURAGED TO PRACTICE SOCIAL DISTANCING DUE TO THE COVID-19 PANDEMIC.

## 2024-02-01 LAB — CBC
Hematocrit: 41.8 % (ref 34.0–46.6)
Hemoglobin: 13.5 g/dL (ref 11.1–15.9)
MCH: 31.4 pg (ref 26.6–33.0)
MCHC: 32.3 g/dL (ref 31.5–35.7)
MCV: 97 fL (ref 79–97)
Platelets: 160 x10E3/uL (ref 150–450)
RBC: 4.3 x10E6/uL (ref 3.77–5.28)
RDW: 12 % (ref 11.7–15.4)
WBC: 5.7 x10E3/uL (ref 3.4–10.8)

## 2024-02-01 LAB — BMP8+EGFR
BUN/Creatinine Ratio: 29 — ABNORMAL HIGH (ref 9–23)
BUN: 18 mg/dL (ref 6–24)
CO2: 21 mmol/L (ref 20–29)
Calcium: 9.7 mg/dL (ref 8.7–10.2)
Chloride: 104 mmol/L (ref 96–106)
Creatinine, Ser: 0.63 mg/dL (ref 0.57–1.00)
Glucose: 68 mg/dL — ABNORMAL LOW (ref 70–99)
Potassium: 4 mmol/L (ref 3.5–5.2)
Sodium: 141 mmol/L (ref 134–144)
eGFR: 103 mL/min/1.73 (ref >59)

## 2024-02-01 LAB — VITAMIN D 25 HYDROXY (VIT D DEFICIENCY, FRACTURES): Vit D, 25-Hydroxy: 42.2 ng/mL (ref 30.0–100.0)

## 2024-02-02 ENCOUNTER — Telehealth: Payer: Self-pay

## 2024-02-05 ENCOUNTER — Ambulatory Visit: Payer: Self-pay | Admitting: Internal Medicine

## 2024-02-05 ENCOUNTER — Ambulatory Visit (INDEPENDENT_AMBULATORY_CARE_PROVIDER_SITE_OTHER): Admitting: Physician Assistant

## 2024-02-05 ENCOUNTER — Encounter: Payer: Self-pay | Admitting: Internal Medicine

## 2024-02-05 DIAGNOSIS — G8929 Other chronic pain: Secondary | ICD-10-CM

## 2024-02-05 DIAGNOSIS — M25511 Pain in right shoulder: Secondary | ICD-10-CM | POA: Diagnosis not present

## 2024-02-05 DIAGNOSIS — Q059 Spina bifida, unspecified: Secondary | ICD-10-CM

## 2024-02-05 DIAGNOSIS — Z789 Other specified health status: Secondary | ICD-10-CM | POA: Insufficient documentation

## 2024-02-05 NOTE — Assessment & Plan Note (Signed)
 Chronic, ambulatory with crutches. Followed by PM&R at [facility]. Most recent notes reviewed in detail.

## 2024-02-05 NOTE — Progress Notes (Signed)
 YEP:[id number] returns requesting new shoes and they double upright braces.  She states that she has had no new injury to her lower extremities.  She is is worn out her shoes and braces.  In regards to her shoulder she states she has some pain in the shoulder but overall the shoulder is doing well.  Review of systems see HPI  Physical exam: General: No acute distress. Brace issues.  She has wear of the bottom aspect of both shoes.  Velcro slightly worn on both lower leg braces. Bilateral shoulders: 5 out of 5 strength with internal rotation against resistance.  Slight weakness with external rotation against resistance right shoulder.  Slight weakness right shoulder with empty can testing impingement testing is negative on the right.  Impression: Spina bifida Right shoulder pain   Plan: Recommend new double upright braces and shoes.  Regards to the shoulder she can follow-up with us  as needed.  She is given a letter stating that she needs assistance in carrying her bags due to her shoulder pain also she uses arm crutches to ambulate.  Therefore it is already difficult for her to get around.  Follow-up as needed.

## 2024-02-05 NOTE — Assessment & Plan Note (Signed)
 Shoulder pain affects mobility and independence. Managed by orthopedic specialist. - Draft a letter to the day program requesting assistance with carrying bags due to shoulder pain.

## 2024-02-05 NOTE — Assessment & Plan Note (Signed)
 Chronic, fair control.  Goal BP<120/80. She will continue with valsartan 160mg  daily in AM and amlodipine 2.5mg  nightly. She is reminded to follow a low sodium diet. She will f /u in four to six months for re-evaluation.

## 2024-02-05 NOTE — Assessment & Plan Note (Signed)
 Chronic lack of sensation in feet affects mobility. Backpack use may reduce shoulder strain. - Advise using a backpack to distribute weight  - Likely related to spina bifida

## 2024-02-05 NOTE — Assessment & Plan Note (Signed)
 She has many foods that she is intolerant of. This information has previously been submitted to her Day program. Confusion regarding coleslaw versus cooked cabbage. Clear communication needed for health management. - Draft a letter to the day program clarifying dietary restrictions.

## 2024-02-23 DIAGNOSIS — R339 Retention of urine, unspecified: Secondary | ICD-10-CM | POA: Diagnosis not present

## 2024-02-28 ENCOUNTER — Other Ambulatory Visit: Payer: Self-pay

## 2024-02-28 NOTE — Patient Outreach (Signed)
 Complex Care Management   Visit Note  02/28/2024  Name:  Mary Neal MRN: [id number] DOB: 02/12/1966  Situation: Referral received for Complex Care Management related to Spina Bifida w/o hydrocephalus; Essential Hypertension, Chronic Right Shoulder Pain. I obtained verbal consent from Parent.  Visit completed with Parent Lillian Munster on the phone.  Background:   Past Medical History:  Diagnosis Date   Abnormal Pap smear 2013   Dislocation of right hip (HCC) 06/09/2018   Per MRI   History of knee replacement 2005   Spina bifida [facility])    Uterine prolapse    Wears glasses     Assessment: Patient Reported Symptoms:  Cognitive Cognitive Status: Alert and oriented to person, place, and time, Normal speech and language skills Cognitive/Intellectual Conditions Management [RPT]: None reported or documented in medical history or problem list   Health Maintenance Behaviors: Annual physical exam, Healthy diet, Social activities, Sleep adequate, Immunizations Healing Pattern: Fast Health Facilitated by: Healthy diet, Rest, Pain control  Neurological Neurological Review of Symptoms: No symptoms reported    HEENT HEENT Symptoms Reported: No symptoms reported      Cardiovascular Cardiovascular Symptoms Reported: No symptoms reported Does patient have uncontrolled Hypertension?: No Cardiovascular Management Strategies: Medication therapy, Routine screening, Diet modification  Respiratory Respiratory Symptoms Reported: No symptoms reported    Endocrine Endocrine Symptoms Reported: No symptoms reported Is patient diabetic?: No    Gastrointestinal Gastrointestinal Symptoms Reported: Constipation, Diarrhea Gastrointestinal Management Strategies: Incontinence garment/pad, Diet modification Gastrointestinal Self-Management Outcome: 3 (uncertain)    Genitourinary Genitourinary Symptoms Reported: No symptoms reported Genitourinary Management Strategies: Catheter, straight, Medication  therapy, Fluid modification Genitourinary Self-Management Outcome: 4 (good)  Integumentary Integumentary Symptoms Reported: No symptoms reported    Musculoskeletal Musculoskelatal Symptoms Reviewed: Muscle pain, Limited mobility, Difficulty walking, Joint pain Additional Musculoskeletal Details: spina bifida, uses crutches for ambulation; chronic right shoulder pain Musculoskeletal Management Strategies: Routine screening, Medical device, Adequate rest Musculoskeletal Self-Management Outcome: 4 (good)      Psychosocial Psychosocial Symptoms Reported: No symptoms reported   Major Change/Loss/Stressor/Fears (CP): Denies Quality of Family Relationships: helpful, involved, supportive Do you feel physically threatened by others?: No    02/28/2024    PHQ2-9 Depression Screening   Signora Zucco interest or pleasure in doing things Not at all  Feeling down, depressed, or hopeless Not at all  PHQ-2 - Total Score 0  Trouble falling or staying asleep, or sleeping too much    Feeling tired or having Crecencio Kwiatek energy    Poor appetite or overeating     Feeling bad about yourself - or that you are a failure or have let yourself or your family down    Trouble concentrating on things, such as reading the newspaper or watching television    Moving or speaking so slowly that other people could have noticed.  Or the opposite - being so fidgety or restless that you have been moving around a lot more than usual    Thoughts that you would be better off dead, or hurting yourself in some way    PHQ2-9 Total Score    If you checked off any problems, how difficult have these problems made it for you to do your work, take care of things at home, or get along with other people    Depression Interventions/Treatment      There were no vitals filed for this visit.  Medications Reviewed Today     Reviewed by Morgan Clayborne CROME, RN (Registered Nurse) on 02/28/24 at 1331  Med  List Status: <None>   Medication Order Taking?  Sig Documenting Provider Last Dose Status Informant  amLODipine  (NORVASC ) 2.5 MG tablet [id number] Yes Take 1 tablet (2.5 mg total) by mouth daily. Jarold Medici, MD  Active   azithromycin  (ZITHROMAX ) 250 MG tablet [id number]  TAKE 2 TABLETS (500 MG) X 1 HOUR PRIOR TO PROCEDURE  Patient not taking: Reported on 02/28/2024   Jarold Medici, MD  Active   Cranberry 250 MG TABS [id number] Yes Take 250 mg by mouth daily. [provider]  Active   Digestive Enzymes (SUPER PAPAYA PLUS PO) [id number] Yes Take 1 capsule by mouth daily at 2 am.  Patient taking differently: Take 1 capsule by mouth as needed (per patient).   [provider]  Active   famotidine  (PEPCID ) 20 MG tablet [id number] Yes TAKE 1 TABLET BY MOUTH DAILY Jarold Medici, MD  Active   Magnesium  300 MG CAPS [id number]  Take by mouth.  Patient not taking: Reported on 02/28/2024   [provider]  Active   meloxicam  (MOBIC ) 7.5 MG tablet [id number]  Take 1 tablet by mouth x 5 days then one tab daily as needed Georgina Speaks, FNP  Consider Medication Status and Discontinue (Completed Course)   Multiple Vitamins-Minerals (MULTIVITAMIN GUMMIES WOMENS PO) [id number] Yes Take by mouth daily. 2 gummies daily [provider]  Active Self  oxybutynin  (DITROPAN  XL) 15 MG 24 hr tablet [id number] Yes TAKE 1 TABLET BY MOUTH TWICE  DAILY Jarold Medici, MD  Active   valsartan  (DIOVAN ) 160 MG tablet [id number] Yes TAKE 1 TABLET BY MOUTH DAILY Jarold Medici, MD  Active   vitamin C (ASCORBIC ACID) 500 MG tablet [id number] Yes Take by mouth. [provider]  Active             Recommendation:   PCP Follow-up Continue Current Plan of Care  Follow Up Plan:   Closing From:  Complex Care Management  Clayborne Ly RN BSN CCM [facility]  [facility], [facility] Nurse Care Coordinator  Direct Dial: [phone] Website: [email or web address]

## 2024-02-29 NOTE — Patient Instructions (Signed)
 Visit Information  Thank you for taking time to visit with me today.   Following is a copy of your care plan:   Goals Addressed             This Visit's Progress    COMPLETED: I would like to keep my BP under good control       Hypertension Interventions: Last practice recorded BP readings:  BP Readings from Last 3 Encounters:  01/31/24 (!) 140/70  09/18/23 134/78  07/11/23 130/80   Most recent eGFR/CrCl:  Lab Results  Component Value Date   EGFR 103 01/31/2024    No components found for: CRCL  Evaluation of current treatment plan related to hypertension self management and patient's adherence to plan as established by provider Completed medication reconciliation with mom Lillian, patient is currently taking amlodipine  2.5 mg and valsartan .  Discussed patient's blood pressure was previously noted to be elevated, but she is unsure of the circumstances.  Determined patient does not regularly check her blood pressure at home due to concerns about the accuracy of her home monitor Determined a nurse at her day program checked her blood pressure in June, but it has not been checked monthly as expected Advised patient, providing education and rationale, to monitor blood pressure daily and record, calling PCP for findings outside established parameters Reiterated to mom Harriet recommendations for patient to follow a low Sodium diet to help avoid elevated BP  Discussed closing patient from complex case management due to patient is remaining healthy and managing her chronic health conditions  Patient Self-Care Activities:  Attend all scheduled provider appointments Call pharmacy for medication refills 3-7 days in advance of running out of medications Call provider office for new concerns or questions  Take medications as prescribed   check blood pressure 3 times per week keep a blood pressure log take blood pressure log to all doctor appointments call doctor for signs and symptoms  of high blood pressure take medications for blood pressure exactly as prescribed report new symptoms to your doctor  Plan:  No further follow up required: Goal Met  Continue Current Plan of Care       COMPLETED: To collect a cologaurd sample       Care Coordination Interventions: Evaluation of current treatment plan related to screen for colon cancer and patient's adherence to plan as established by provider Goal outcome unknown     COMPLETED: To have less muscle cramping       Care Coordination Interventions: Evaluation of current treatment plan related to muscle cramps and patient's adherence to plan as established by provider Completed medication reconciliation with mom Lillian, patient is not currently taking Magnesium  due to mom needing to refill, discussed patient did not find this supplement to improve her muscle cramps Determined patient is using arm crutches for ambulation and discussed this may be contributing her overall muscle cramps Discussed patient does not drink an adequate amount of water most days due to having incontinence when she increases her water intake  Educated mom Harriet re: the effects from dehydration on the muscles/joints  Discussed patient was evaluated by Orthopedics to obtain new shoes and braces due to her current ones are worn out  Discussed closing patient from complex case management due to patient is staying healthy and managing her chronic health conditions  Instructed mom Lillian to keep patient's doctor well informed of new symptoms or concerns and she verbalizes understanding   Patient Self-Care Activities:  Attend all scheduled provider appointments  Call pharmacy for medication refills 3-7 days in advance of running out of medications Call provider office for new concerns or questions  Take medications as prescribed    Plan:  No further follow up required: Goal Met Continue Current Plan of Care          Please call [phone]-TALK  (toll free, 24 hour hotline) if you are experiencing a Mental Health or Behavioral Health Crisis or need someone to talk to.  Patient verbalizes understanding of instructions and care plan provided today and agrees to view in MyChart. Active MyChart status and patient understanding of how to access instructions and care plan via MyChart confirmed with patient.     Clayborne Ly RN BSN CCM [place]  [facility], [facility] Nurse Care Coordinator  Direct Dial: [phone] Website: [email or web address]

## 2024-03-19 ENCOUNTER — Other Ambulatory Visit: Payer: Self-pay | Admitting: Internal Medicine

## 2024-03-21 DIAGNOSIS — R2689 Other abnormalities of gait and mobility: Secondary | ICD-10-CM | POA: Diagnosis not present

## 2024-04-08 DIAGNOSIS — R339 Retention of urine, unspecified: Secondary | ICD-10-CM | POA: Diagnosis not present

## 2024-04-29 DIAGNOSIS — K5900 Constipation, unspecified: Secondary | ICD-10-CM | POA: Diagnosis not present

## 2024-04-29 DIAGNOSIS — R252 Cramp and spasm: Secondary | ICD-10-CM | POA: Diagnosis not present

## 2024-04-29 DIAGNOSIS — Q057 Lumbar spina bifida without hydrocephalus: Secondary | ICD-10-CM | POA: Diagnosis not present

## 2024-04-29 DIAGNOSIS — K592 Neurogenic bowel, not elsewhere classified: Secondary | ICD-10-CM | POA: Diagnosis not present

## 2024-05-03 ENCOUNTER — Other Ambulatory Visit: Payer: Self-pay | Admitting: Internal Medicine

## 2024-05-13 ENCOUNTER — Encounter: Payer: Self-pay | Admitting: Radiology

## 2024-06-13 ENCOUNTER — Ambulatory Visit: Payer: Self-pay | Admitting: Internal Medicine

## 2024-06-13 NOTE — Progress Notes (Deleted)
 I,Archit Leger T Emmitt, CMA,acting as a neurosurgeon for Catheryn LOISE Slocumb, MD.,have documented all relevant documentation on the behalf of Catheryn LOISE Slocumb, MD,as directed by  Catheryn LOISE Slocumb, MD while in the presence of Catheryn LOISE Slocumb, MD.  Subjective:  Patient ID: Mary Neal , female    DOB: 1965-07-27 , 58 y.o.   MRN: [id number]  No chief complaint on file.   HPI  She presents today for BP & vitamin D  check. She reports compliance with meds. She is accompanied by her Mother today. She denies having any headaches, palpitations and shortness of breath.    Hypertension This is a chronic problem. The problem has been gradually improving since onset. The problem is controlled. Past treatments include angiotensin blockers and calcium channel blockers. The current treatment provides moderate improvement. There is no history of CVA or heart failure.     Past Medical History:  Diagnosis Date   Abnormal Pap smear 2013   Dislocation of right hip (HCC) 06/09/2018   Per MRI   History of knee replacement 2005   Spina bifida [facility])    Uterine prolapse    Wears glasses      Family History  Problem Relation Age of Onset   Hypertension Mother    Hyperlipidemia Mother    Heart attack Brother      Current Outpatient Medications:    amLODipine  (NORVASC ) 2.5 MG tablet, TAKE 1 TABLET BY MOUTH EVERY DAY, Disp: 90 tablet, Rfl: 3   azithromycin  (ZITHROMAX ) 250 MG tablet, TAKE 2 TABLETS (500 MG) X 1 HOUR PRIOR TO PROCEDURE, Disp: 2 tablet, Rfl: 2   Cranberry 250 MG TABS, Take 250 mg by mouth daily., Disp: , Rfl:    Digestive Enzymes (SUPER PAPAYA PLUS PO), Take 1 capsule by mouth daily at 2 am. (Patient taking differently: Take 1 capsule by mouth as needed (per patient).), Disp: , Rfl:    famotidine  (PEPCID ) 20 MG tablet, TAKE 1 TABLET BY MOUTH DAILY, Disp: 100 tablet, Rfl: 2   Magnesium  300 MG CAPS, Take by mouth. (Patient not taking: Reported on 02/28/2024), Disp: , Rfl:    meloxicam  (MOBIC ) 7.5 MG  tablet, Take 1 tablet by mouth x 5 days then one tab daily as needed, Disp: 30 tablet, Rfl: 1   Multiple Vitamins-Minerals (MULTIVITAMIN GUMMIES WOMENS PO), Take by mouth daily. 2 gummies daily, Disp: , Rfl:    oxybutynin  (DITROPAN  XL) 15 MG 24 hr tablet, TAKE 1 TABLET BY MOUTH TWICE  DAILY, Disp: 200 tablet, Rfl: 2   valsartan  (DIOVAN ) 160 MG tablet, TAKE 1 TABLET BY MOUTH DAILY, Disp: 100 tablet, Rfl: 2   vitamin C (ASCORBIC ACID) 500 MG tablet, Take by mouth., Disp: , Rfl:    Allergies  Allergen Reactions   Clindamycin /Lincomycin Palpitations   Latex Other (See Comments)   Penicillins Other (See Comments)     Review of Systems  Constitutional: Negative.   Respiratory: Negative.    Cardiovascular: Negative.   Neurological: Negative.   Psychiatric/Behavioral: Negative.       There were no vitals filed for this visit. There is no height or weight on file to calculate BMI.  Wt Readings from Last 3 Encounters:  01/31/24 116 lb 12.8 oz (53 kg)  09/18/23 112 lb 12.8 oz (51.2 kg)  07/11/23 112 lb (50.8 kg)    The 10-year ASCVD risk score (Arnett DK, et al., 2019) is: 5.8%   Values used to calculate the score:     Age: 2 years  Clincally relevant sex: Female     Is Non-Hispanic African American: Yes     Diabetic: No     Tobacco smoker: No     Systolic Blood Pressure: 140 mmHg     Is BP treated: Yes     HDL Cholesterol: 93 mg/dL     Total Cholesterol: 197 mg/dL  Objective:  Physical Exam      Assessment And Plan:   Assessment & Plan Essential hypertension, benign  Spina bifida without hydrocephalus, unspecified spinal region [facility])  Peripheral motor neuropathy  Vitamin D  deficiency disease  GERD   No orders of the defined types were placed in this encounter.    No follow-ups on file.  Patient was given opportunity to ask questions. Patient verbalized understanding of the plan and was able to repeat key elements of the plan. All questions were answered to  their satisfaction.    I, Catheryn LOISE Slocumb, MD, have reviewed all documentation for this visit. The documentation on 06/13/24 for the exam, diagnosis, procedures, and orders are all accurate and complete.   IF YOU HAVE BEEN REFERRED TO A SPECIALIST, IT MAY TAKE 1-2 WEEKS TO SCHEDULE/PROCESS THE REFERRAL. IF YOU HAVE NOT HEARD FROM US /SPECIALIST IN TWO WEEKS, PLEASE GIVE US  A CALL AT [phone] X [phone].

## 2024-06-13 NOTE — Patient Instructions (Incomplete)
 Hypertension, Adult Hypertension is another name for high blood pressure. High blood pressure forces your heart to work harder to pump blood. This can cause problems over time. There are two numbers in a blood pressure reading. There is a top number (systolic) over a bottom number (diastolic). It is best to have a blood pressure that is below 120/80. What are the causes? The cause of this condition is not known. Some other conditions can lead to high blood pressure. What increases the risk? Some lifestyle factors can make you more likely to develop high blood pressure: Smoking. Not getting enough exercise or physical activity. Being overweight. Having too much fat, sugar, calories, or salt (sodium) in your diet. Drinking too much alcohol. Other risk factors include: Having any of these conditions: Heart disease. Diabetes. High cholesterol. Kidney disease. Obstructive sleep apnea. Having a family history of high blood pressure and high cholesterol. Age. The risk increases with age. Stress. What are the signs or symptoms? High blood pressure may not cause symptoms. Very high blood pressure (hypertensive crisis) may cause: Headache. Fast or uneven heartbeats (palpitations). Shortness of breath. Nosebleed. Vomiting or feeling like you may vomit (nauseous). Changes in how you see. Very bad chest pain. Feeling dizzy. Seizures. How is this treated? This condition is treated by making healthy lifestyle changes, such as: Eating healthy foods. Exercising more. Drinking less alcohol. Your doctor may prescribe medicine if lifestyle changes do not help enough and if: Your top number is above 130. Your bottom number is above 80. Your personal target blood pressure may vary. Follow these instructions at home: Eating and drinking  If told, follow the DASH eating plan. To follow this plan: Fill one half of your plate at each meal with fruits and vegetables. Fill one fourth of your plate  at each meal with whole grains. Whole grains include whole-wheat pasta, brown rice, and whole-grain bread. Eat or drink low-fat dairy products, such as skim milk or low-fat yogurt. Fill one fourth of your plate at each meal with low-fat (lean) proteins. Low-fat proteins include fish, chicken without skin, eggs, beans, and tofu. Avoid fatty meat, cured and processed meat, or chicken with skin. Avoid pre-made or processed food. Limit the amount of salt in your diet to less than 1,500 mg each day. Do not drink alcohol if: Your doctor tells you not to drink. You are pregnant, may be pregnant, or are planning to become pregnant. If you drink alcohol: Limit how much you have to: 0-1 drink a day for women. 0-2 drinks a day for men. Know how much alcohol is in your drink. In the [place] one drink equals one 12 oz bottle of beer (355 mL), one 5 oz glass of wine (148 mL), or one 1 oz glass of hard liquor (44 mL). Lifestyle  Work with your doctor to stay at a healthy weight or to lose weight. Ask your doctor what the best weight is for you. Get at least 30 minutes of exercise that causes your heart to beat faster (aerobic exercise) most days of the week. This may include walking, swimming, or biking. Get at least 30 minutes of exercise that strengthens your muscles (resistance exercise) at least 3 days a week. This may include lifting weights or doing Pilates. Do not smoke or use any products that contain nicotine or tobacco. If you need help quitting, ask your doctor. Check your blood pressure at home as told by your doctor. Keep all follow-up visits. Medicines Take over-the-counter and prescription medicines  only as told by your doctor. Follow directions carefully. Do not skip doses of blood pressure medicine. The medicine does not work as well if you skip doses. Skipping doses also puts you at risk for problems. Ask your doctor about side effects or reactions to medicines that you should watch  for. Contact a doctor if: You think you are having a reaction to the medicine you are taking. You have headaches that keep coming back. You feel dizzy. You have swelling in your ankles. You have trouble with your vision. Get help right away if: You get a very bad headache. You start to feel mixed up (confused). You feel weak or numb. You feel faint. You have very bad pain in your: Chest. Belly (abdomen). You vomit more than once. You have trouble breathing. These symptoms may be an emergency. Get help right away. Call 911. Do not wait to see if the symptoms will go away. Do not drive yourself to the hospital. Summary Hypertension is another name for high blood pressure. High blood pressure forces your heart to work harder to pump blood. For most people, a normal blood pressure is less than 120/80. Making healthy choices can help lower blood pressure. If your blood pressure does not get lower with healthy choices, you may need to take medicine. This information is not intended to replace advice given to you by your health care provider. Make sure you discuss any questions you have with your health care provider. Document Revised: 04/15/2021 Document Reviewed: 04/15/2021 Elsevier Patient Education  2024 [employer].

## 2024-06-19 ENCOUNTER — Ambulatory Visit: Admitting: Internal Medicine

## 2024-06-19 ENCOUNTER — Encounter: Payer: Self-pay | Admitting: Family Medicine

## 2024-06-19 ENCOUNTER — Ambulatory Visit: Admitting: Family Medicine

## 2024-06-19 VITALS — BP 130/60 | HR 82 | Temp 98.2°F | Ht <= 58 in | Wt 116.0 lb

## 2024-06-19 DIAGNOSIS — G6289 Other specified polyneuropathies: Secondary | ICD-10-CM

## 2024-06-19 DIAGNOSIS — Z136 Encounter for screening for cardiovascular disorders: Secondary | ICD-10-CM | POA: Diagnosis not present

## 2024-06-19 DIAGNOSIS — I10 Essential (primary) hypertension: Secondary | ICD-10-CM | POA: Diagnosis not present

## 2024-06-19 DIAGNOSIS — Q059 Spina bifida, unspecified: Secondary | ICD-10-CM | POA: Diagnosis not present

## 2024-06-19 DIAGNOSIS — Z23 Encounter for immunization: Secondary | ICD-10-CM

## 2024-06-19 DIAGNOSIS — N3281 Overactive bladder: Secondary | ICD-10-CM

## 2024-06-19 DIAGNOSIS — G8220 Paraplegia, unspecified: Secondary | ICD-10-CM

## 2024-06-19 DIAGNOSIS — K219 Gastro-esophageal reflux disease without esophagitis: Secondary | ICD-10-CM | POA: Diagnosis not present

## 2024-06-19 NOTE — Progress Notes (Unsigned)
 I,Jameka J Llittleton, CMA,acting as a neurosurgeon for [employer], NP.,have documented all relevant documentation on the behalf of Bruna Creighton, NP,as directed by  Bruna Creighton, NP while in the presence of Bruna Creighton, NP.  Subjective:  Patient ID: Mary Neal , female    DOB: 11/29/1965 , 58 y.o.   MRN: [id number]  Chief Complaint  Patient presents with   Hypertension    HPI  Hypertension This is a chronic problem. The problem has been gradually improving since onset. The problem is controlled. Past treatments include angiotensin blockers and calcium channel blockers. The current treatment provides moderate improvement. There is no history of CVA or heart failure.     Past Medical History:  Diagnosis Date   Abnormal Pap smear 2013   Dislocation of right hip (HCC) 06/09/2018   Per MRI   History of knee replacement 2005   Spina bifida [facility])    Uterine prolapse    Wears glasses      Family History  Problem Relation Age of Onset   Hypertension Mother    Hyperlipidemia Mother    Heart attack Brother      Current Outpatient Medications:    amLODipine  (NORVASC ) 2.5 MG tablet, TAKE 1 TABLET BY MOUTH EVERY DAY, Disp: 90 tablet, Rfl: 3   azithromycin  (ZITHROMAX ) 250 MG tablet, TAKE 2 TABLETS (500 MG) X 1 HOUR PRIOR TO PROCEDURE, Disp: 2 tablet, Rfl: 2   Cranberry 250 MG TABS, Take 250 mg by mouth daily., Disp: , Rfl:    Digestive Enzymes (SUPER PAPAYA PLUS PO), Take 1 capsule by mouth daily at 2 am. (Patient taking differently: Take 1 capsule by mouth as needed (per patient).), Disp: , Rfl:    famotidine  (PEPCID ) 20 MG tablet, TAKE 1 TABLET BY MOUTH DAILY, Disp: 100 tablet, Rfl: 2   Magnesium  300 MG CAPS, Take by mouth. (Patient not taking: Reported on 02/28/2024), Disp: , Rfl:    meloxicam  (MOBIC ) 7.5 MG tablet, Take 1 tablet by mouth x 5 days then one tab daily as needed, Disp: 30 tablet, Rfl: 1   Multiple Vitamins-Minerals (MULTIVITAMIN GUMMIES WOMENS PO), Take by mouth daily. 2  gummies daily, Disp: , Rfl:    oxybutynin  (DITROPAN  XL) 15 MG 24 hr tablet, TAKE 1 TABLET BY MOUTH TWICE  DAILY, Disp: 200 tablet, Rfl: 2   valsartan  (DIOVAN ) 160 MG tablet, TAKE 1 TABLET BY MOUTH DAILY, Disp: 100 tablet, Rfl: 2   vitamin C (ASCORBIC ACID) 500 MG tablet, Take by mouth., Disp: , Rfl:    Allergies  Allergen Reactions   Clindamycin /Lincomycin Palpitations   Latex Other (See Comments)   Penicillins Other (See Comments)     Review of Systems  Constitutional: Negative.   Eyes: Negative.   Musculoskeletal: Negative.   Skin: Negative.   Psychiatric/Behavioral: Negative.       There were no vitals filed for this visit. There is no height or weight on file to calculate BMI.  Wt Readings from Last 3 Encounters:  01/31/24 116 lb 12.8 oz (53 kg)  09/18/23 112 lb 12.8 oz (51.2 kg)  07/11/23 112 lb (50.8 kg)    The 10-year ASCVD risk score (Arnett DK, et al., 2019) is: 6.4%   Values used to calculate the score:     Age: 78 years     Clincally relevant sex: Female     Is Non-Hispanic African American: Yes     Diabetic: No     Tobacco smoker: No     Systolic  Blood Pressure: 144 mmHg     Is BP treated: Yes     HDL Cholesterol: 93 mg/dL     Total Cholesterol: 197 mg/dL  Objective:  Physical Exam      Assessment And Plan:   Assessment & Plan Essential hypertension, benign  Spina bifida without hydrocephalus, unspecified spinal region [facility])  Need for influenza vaccination  Peripheral motor neuropathy   No orders of the defined types were placed in this encounter.    No follow-ups on file.  Patient was given opportunity to ask questions. Patient verbalized understanding of the plan and was able to repeat key elements of the plan. All questions were answered to their satisfaction.    I, Bruna Creighton, NP, have reviewed all documentation for this visit. The documentation on 06/19/24 for the exam, diagnosis, procedures, and orders are all accurate and complete.    IF YOU HAVE BEEN REFERRED TO A SPECIALIST, IT MAY TAKE 1-2 WEEKS TO SCHEDULE/PROCESS THE REFERRAL. IF YOU HAVE NOT HEARD FROM US /SPECIALIST IN TWO WEEKS, PLEASE GIVE US  A CALL AT [phone].

## 2024-06-19 NOTE — Patient Instructions (Signed)
 Hypertension, Adult Hypertension is another name for high blood pressure. High blood pressure forces your heart to work harder to pump blood. This can cause problems over time. There are two numbers in a blood pressure reading. There is a top number (systolic) over a bottom number (diastolic). It is best to have a blood pressure that is below 120/80. What are the causes? The cause of this condition is not known. Some other conditions can lead to high blood pressure. What increases the risk? Some lifestyle factors can make you more likely to develop high blood pressure: Smoking. Not getting enough exercise or physical activity. Being overweight. Having too much fat, sugar, calories, or salt (sodium) in your diet. Drinking too much alcohol. Other risk factors include: Having any of these conditions: Heart disease. Diabetes. High cholesterol. Kidney disease. Obstructive sleep apnea. Having a family history of high blood pressure and high cholesterol. Age. The risk increases with age. Stress. What are the signs or symptoms? High blood pressure may not cause symptoms. Very high blood pressure (hypertensive crisis) may cause: Headache. Fast or uneven heartbeats (palpitations). Shortness of breath. Nosebleed. Vomiting or feeling like you may vomit (nauseous). Changes in how you see. Very bad chest pain. Feeling dizzy. Seizures. How is this treated? This condition is treated by making healthy lifestyle changes, such as: Eating healthy foods. Exercising more. Drinking less alcohol. Your doctor may prescribe medicine if lifestyle changes do not help enough and if: Your top number is above 130. Your bottom number is above 80. Your personal target blood pressure may vary. Follow these instructions at home: Eating and drinking  If told, follow the DASH eating plan. To follow this plan: Fill one half of your plate at each meal with fruits and vegetables. Fill one fourth of your plate  at each meal with whole grains. Whole grains include whole-wheat pasta, brown rice, and whole-grain bread. Eat or drink low-fat dairy products, such as skim milk or low-fat yogurt. Fill one fourth of your plate at each meal with low-fat (lean) proteins. Low-fat proteins include fish, chicken without skin, eggs, beans, and tofu. Avoid fatty meat, cured and processed meat, or chicken with skin. Avoid pre-made or processed food. Limit the amount of salt in your diet to less than 1,500 mg each day. Do not drink alcohol if: Your doctor tells you not to drink. You are pregnant, may be pregnant, or are planning to become pregnant. If you drink alcohol: Limit how much you have to: 0-1 drink a day for women. 0-2 drinks a day for men. Know how much alcohol is in your drink. In the [place] one drink equals one 12 oz bottle of beer (355 mL), one 5 oz glass of wine (148 mL), or one 1 oz glass of hard liquor (44 mL). Lifestyle  Work with your doctor to stay at a healthy weight or to lose weight. Ask your doctor what the best weight is for you. Get at least 30 minutes of exercise that causes your heart to beat faster (aerobic exercise) most days of the week. This may include walking, swimming, or biking. Get at least 30 minutes of exercise that strengthens your muscles (resistance exercise) at least 3 days a week. This may include lifting weights or doing Pilates. Do not smoke or use any products that contain nicotine or tobacco. If you need help quitting, ask your doctor. Check your blood pressure at home as told by your doctor. Keep all follow-up visits. Medicines Take over-the-counter and prescription medicines  only as told by your doctor. Follow directions carefully. Do not skip doses of blood pressure medicine. The medicine does not work as well if you skip doses. Skipping doses also puts you at risk for problems. Ask your doctor about side effects or reactions to medicines that you should watch  for. Contact a doctor if: You think you are having a reaction to the medicine you are taking. You have headaches that keep coming back. You feel dizzy. You have swelling in your ankles. You have trouble with your vision. Get help right away if: You get a very bad headache. You start to feel mixed up (confused). You feel weak or numb. You feel faint. You have very bad pain in your: Chest. Belly (abdomen). You vomit more than once. You have trouble breathing. These symptoms may be an emergency. Get help right away. Call 911. Do not wait to see if the symptoms will go away. Do not drive yourself to the hospital. Summary Hypertension is another name for high blood pressure. High blood pressure forces your heart to work harder to pump blood. For most people, a normal blood pressure is less than 120/80. Making healthy choices can help lower blood pressure. If your blood pressure does not get lower with healthy choices, you may need to take medicine. This information is not intended to replace advice given to you by your health care provider. Make sure you discuss any questions you have with your health care provider. Document Revised: 04/15/2021 Document Reviewed: 04/15/2021 Elsevier Patient Education  2024 [employer].

## 2024-06-20 LAB — CBC
Hematocrit: 40.9 % (ref 34.0–46.6)
Hemoglobin: 13.2 g/dL (ref 11.1–15.9)
MCH: 31.2 pg (ref 26.6–33.0)
MCHC: 32.3 g/dL (ref 31.5–35.7)
MCV: 97 fL (ref 79–97)
Platelets: 162 x10E3/uL (ref 150–450)
RBC: 4.23 x10E6/uL (ref 3.77–5.28)
RDW: 11.8 % (ref 11.7–15.4)
WBC: 7.2 x10E3/uL (ref 3.4–10.8)

## 2024-06-20 LAB — CMP14+EGFR
ALT: 22 IU/L (ref 0–32)
AST: 28 IU/L (ref 0–40)
Albumin: 4.2 g/dL (ref 3.8–4.9)
Alkaline Phosphatase: 74 IU/L (ref 49–135)
BUN/Creatinine Ratio: 30 — ABNORMAL HIGH (ref 9–23)
BUN: 21 mg/dL (ref 6–24)
Bilirubin Total: 0.3 mg/dL (ref 0.0–1.2)
CO2: 25 mmol/L (ref 20–29)
Calcium: 10.1 mg/dL (ref 8.7–10.2)
Chloride: 102 mmol/L (ref 96–106)
Creatinine, Ser: 0.69 mg/dL (ref 0.57–1.00)
Globulin, Total: 2 g/dL (ref 1.5–4.5)
Glucose: 79 mg/dL (ref 70–99)
Potassium: 4.1 mmol/L (ref 3.5–5.2)
Sodium: 140 mmol/L (ref 134–144)
Total Protein: 6.2 g/dL (ref 6.0–8.5)
eGFR: 100 mL/min/1.73 (ref >59)

## 2024-06-20 LAB — LIPID PANEL
Chol/HDL Ratio: 2.1 ratio (ref 0.0–4.4)
Cholesterol, Total: 196 mg/dL (ref 100–199)
HDL: 94 mg/dL (ref >39)
LDL Chol Calc (NIH): 92 mg/dL (ref 0–99)
Triglycerides: 54 mg/dL (ref 0–149)
VLDL Cholesterol Cal: 10 mg/dL (ref 5–40)

## 2024-07-02 DIAGNOSIS — Z136 Encounter for screening for cardiovascular disorders: Secondary | ICD-10-CM | POA: Insufficient documentation

## 2024-07-02 DIAGNOSIS — Z23 Encounter for immunization: Secondary | ICD-10-CM | POA: Insufficient documentation

## 2024-07-02 NOTE — Assessment & Plan Note (Signed)
 Blood pressure controlled at 130/60 mmHg with current medication. - Continue amlodipine  2.5 mg daily. - Continue valsartan  160 mg daily.

## 2024-07-02 NOTE — Assessment & Plan Note (Signed)
 Spina bifida with neurogenic bladder and bowel Irregular bowel movements with diarrhea and constipation. Difficulty sensing bowel movements. Recent major accident due to inability to reach bathroom in time. - Monitor bowel movements and adjust magnesium  intake as needed.

## 2024-07-02 NOTE — Assessment & Plan Note (Signed)
Administer influenza vaccine

## 2024-07-02 NOTE — Assessment & Plan Note (Signed)
 Check lipids

## 2024-07-02 NOTE — Assessment & Plan Note (Addendum)
 Continue famotidine as prescribed

## 2024-07-24 ENCOUNTER — Other Ambulatory Visit: Payer: Self-pay | Admitting: Urology

## 2024-07-24 DIAGNOSIS — K7689 Other specified diseases of liver: Secondary | ICD-10-CM

## 2024-08-05 ENCOUNTER — Inpatient Hospital Stay: Admission: RE | Admit: 2024-08-05 | Source: Ambulatory Visit

## 2024-08-14 ENCOUNTER — Inpatient Hospital Stay
Admission: RE | Admit: 2024-08-14 | Discharge: 2024-08-14 | Disposition: A | Source: Ambulatory Visit | Attending: Urology | Admitting: Urology

## 2024-08-14 ENCOUNTER — Ambulatory Visit: Payer: 59

## 2024-08-14 DIAGNOSIS — K7689 Other specified diseases of liver: Secondary | ICD-10-CM

## 2024-08-14 MED ORDER — IOPAMIDOL (ISOVUE-300) INJECTION 61%
100.0000 mL | Freq: Once | INTRAVENOUS | Status: AC | PRN
  Administered 2024-08-14: 100 mL via INTRAVENOUS

## 2024-09-18 ENCOUNTER — Ambulatory Visit

## 2024-10-17 ENCOUNTER — Ambulatory Visit: Admitting: Internal Medicine
